# Patient Record
Sex: Male | Born: 1960 | Race: White | Hispanic: No | Marital: Married | Smoking: Never smoker
Health system: Southern US, Community
[De-identification: ages and names within clinical notes are randomized; demographics above are authoritative.]

## PROBLEM LIST (undated history)

## (undated) DIAGNOSIS — J329 Chronic sinusitis, unspecified: Secondary | ICD-10-CM

## (undated) DIAGNOSIS — K5909 Other constipation: Secondary | ICD-10-CM

## (undated) DIAGNOSIS — Z85828 Personal history of other malignant neoplasm of skin: Secondary | ICD-10-CM

## (undated) DIAGNOSIS — L501 Idiopathic urticaria: Secondary | ICD-10-CM

## (undated) DIAGNOSIS — E291 Testicular hypofunction: Secondary | ICD-10-CM

## (undated) DIAGNOSIS — E041 Nontoxic single thyroid nodule: Secondary | ICD-10-CM

## (undated) DIAGNOSIS — T7840XA Allergy, unspecified, initial encounter: Secondary | ICD-10-CM

## (undated) DIAGNOSIS — G4730 Sleep apnea, unspecified: Secondary | ICD-10-CM

## (undated) DIAGNOSIS — R7301 Impaired fasting glucose: Secondary | ICD-10-CM

## (undated) DIAGNOSIS — G709 Myoneural disorder, unspecified: Secondary | ICD-10-CM

## (undated) DIAGNOSIS — K279 Peptic ulcer, site unspecified, unspecified as acute or chronic, without hemorrhage or perforation: Secondary | ICD-10-CM

## (undated) DIAGNOSIS — Z5189 Encounter for other specified aftercare: Secondary | ICD-10-CM

## (undated) DIAGNOSIS — I878 Other specified disorders of veins: Secondary | ICD-10-CM

## (undated) DIAGNOSIS — N400 Enlarged prostate without lower urinary tract symptoms: Secondary | ICD-10-CM

## (undated) DIAGNOSIS — R9389 Abnormal findings on diagnostic imaging of other specified body structures: Secondary | ICD-10-CM

## (undated) DIAGNOSIS — Z9989 Dependence on other enabling machines and devices: Secondary | ICD-10-CM

## (undated) DIAGNOSIS — Z86718 Personal history of other venous thrombosis and embolism: Secondary | ICD-10-CM

## (undated) DIAGNOSIS — Z86010 Personal history of colonic polyps: Secondary | ICD-10-CM

## (undated) DIAGNOSIS — C801 Malignant (primary) neoplasm, unspecified: Secondary | ICD-10-CM

## (undated) DIAGNOSIS — J069 Acute upper respiratory infection, unspecified: Secondary | ICD-10-CM

## (undated) DIAGNOSIS — G4733 Obstructive sleep apnea (adult) (pediatric): Secondary | ICD-10-CM

## (undated) DIAGNOSIS — Z860101 Personal history of adenomatous and serrated colon polyps: Secondary | ICD-10-CM

## (undated) DIAGNOSIS — G35 Multiple sclerosis: Secondary | ICD-10-CM

## (undated) HISTORY — DX: Encounter for other specified aftercare: Z51.89

## (undated) HISTORY — DX: Obstructive sleep apnea (adult) (pediatric): G47.33

## (undated) HISTORY — DX: Abnormal findings on diagnostic imaging of other specified body structures: R93.89

## (undated) HISTORY — DX: Testicular hypofunction: E29.1

## (undated) HISTORY — DX: Myoneural disorder, unspecified: G70.9

## (undated) HISTORY — DX: Sleep apnea, unspecified: G47.30

## (undated) HISTORY — DX: Nontoxic single thyroid nodule: E04.1

## (undated) HISTORY — PX: COLONOSCOPY: SHX174

## (undated) HISTORY — DX: Chronic sinusitis, unspecified: J32.9

## (undated) HISTORY — DX: Idiopathic urticaria: L50.1

## (undated) HISTORY — DX: Acute upper respiratory infection, unspecified: J06.9

## (undated) HISTORY — DX: Other specified disorders of veins: I87.8

## (undated) HISTORY — DX: Personal history of colonic polyps: Z86.010

## (undated) HISTORY — DX: Allergy, unspecified, initial encounter: T78.40XA

## (undated) HISTORY — DX: Personal history of other venous thrombosis and embolism: Z86.718

## (undated) HISTORY — DX: Dependence on other enabling machines and devices: Z99.89

## (undated) HISTORY — DX: Other constipation: K59.09

## (undated) HISTORY — DX: Benign prostatic hyperplasia without lower urinary tract symptoms: N40.0

## (undated) HISTORY — DX: Peptic ulcer, site unspecified, unspecified as acute or chronic, without hemorrhage or perforation: K27.9

## (undated) HISTORY — DX: Impaired fasting glucose: R73.01

## (undated) HISTORY — DX: Personal history of adenomatous and serrated colon polyps: Z86.0101

## (undated) HISTORY — DX: Multiple sclerosis: G35

## (undated) HISTORY — DX: Personal history of other malignant neoplasm of skin: Z85.828

## (undated) HISTORY — PX: TONSILLECTOMY: SUR1361

---
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
## 1984-07-14 HISTORY — PX: APPENDECTOMY: SHX54

## 1990-07-14 DIAGNOSIS — K279 Peptic ulcer, site unspecified, unspecified as acute or chronic, without hemorrhage or perforation: Secondary | ICD-10-CM | POA: Insufficient documentation

## 1990-07-14 DIAGNOSIS — G35 Multiple sclerosis: Secondary | ICD-10-CM

## 1990-07-14 HISTORY — DX: Multiple sclerosis: G35

## 2003-07-15 DIAGNOSIS — G4733 Obstructive sleep apnea (adult) (pediatric): Secondary | ICD-10-CM

## 2003-07-15 HISTORY — DX: Obstructive sleep apnea (adult) (pediatric): G47.33

## 2011-06-12 ENCOUNTER — Ambulatory Visit (INDEPENDENT_AMBULATORY_CARE_PROVIDER_SITE_OTHER): Payer: Managed Care, Other (non HMO) | Admitting: Family Medicine

## 2011-06-12 ENCOUNTER — Encounter: Payer: Self-pay | Admitting: Family Medicine

## 2011-06-12 DIAGNOSIS — N400 Enlarged prostate without lower urinary tract symptoms: Secondary | ICD-10-CM

## 2011-06-12 DIAGNOSIS — K5900 Constipation, unspecified: Secondary | ICD-10-CM

## 2011-06-12 DIAGNOSIS — J309 Allergic rhinitis, unspecified: Secondary | ICD-10-CM

## 2011-06-12 DIAGNOSIS — Z1211 Encounter for screening for malignant neoplasm of colon: Secondary | ICD-10-CM

## 2011-06-12 DIAGNOSIS — G35 Multiple sclerosis: Secondary | ICD-10-CM

## 2011-06-12 DIAGNOSIS — K6289 Other specified diseases of anus and rectum: Secondary | ICD-10-CM

## 2011-06-12 DIAGNOSIS — G35D Multiple sclerosis, unspecified: Secondary | ICD-10-CM

## 2011-06-12 LAB — POCT URINALYSIS DIPSTICK
Blood, UA: NEGATIVE
Protein, UA: NEGATIVE
Spec Grav, UA: 1.025
Urobilinogen, UA: 0.2

## 2011-06-12 LAB — CBC WITH DIFFERENTIAL/PLATELET
Basophils Relative: 0.4 % (ref 0.0–3.0)
Eosinophils Relative: 1.9 % (ref 0.0–5.0)
Hemoglobin: 14.6 g/dL (ref 13.0–17.0)
Lymphocytes Relative: 31.6 % (ref 12.0–46.0)
MCV: 93 fl (ref 78.0–100.0)
Neutrophils Relative %: 55.7 % (ref 43.0–77.0)
RBC: 4.63 Mil/uL (ref 4.22–5.81)
WBC: 6.1 10*3/uL (ref 4.5–10.5)

## 2011-06-12 LAB — COMPREHENSIVE METABOLIC PANEL
Albumin: 4.1 g/dL (ref 3.5–5.2)
CO2: 29 mEq/L (ref 19–32)
Calcium: 9.4 mg/dL (ref 8.4–10.5)
GFR: 80.15 mL/min (ref >60.00)
Glucose, Bld: 85 mg/dL (ref 70–99)
Potassium: 4.8 mEq/L (ref 3.5–5.1)
Sodium: 140 mEq/L (ref 135–145)
Total Protein: 6.9 g/dL (ref 6.0–8.3)

## 2011-06-12 LAB — SEDIMENTATION RATE: Sed Rate: 10 mm/hr (ref 0–22)

## 2011-06-12 NOTE — Progress Notes (Signed)
Office Note 06/13/2011  CC:  Chief Complaint  Patient presents with  . Establish Care    urinary issues, referral to neuro for MS management    HPI:  Brian Walsh is a 50 y.o. White male who is here to establish care and discuss bowel issue lately. Patient's most recent primary MD: Dr. Warnell Forester in [place], [place]. Old records were not reviewed prior to or during today's visit.  Describes about 6 wk history of problems with feeling vague low back discomfort and fullness in the middle of the night/early morning hours, and this feeling is consistently relieved by having a BM.  No abd pain, no nausea, no trouble with obstructive or other urinary symptoms. Says he also notes a tendency lately to have difficulty getting his BMs to come out, and only occasionally are they hard.  Evacuation often seems incomplete.  Often feels the sensation that he has to defacate but nothing ends up coming out.  No rectal pain or bleeding, but does occasionally have feeling of rectal fullness/discomfort that tries to get to go away by shifting around on his bottom while sitting.   No significant change in diet or activity preceding onset of this problem.   Has 2 cups of coffee daily.  He does not take any stool laxatives, fiber supplements, or stool softeners.  Past Medical History  Diagnosis Date  . Multiple sclerosis 1992    Remission since 1996 on no meds.  Initial presentation was gait/balance and leg weakness sx's.  . Allergic rhinitis     Much improved with allergy immunotherapy  . Asthma     no inhaler requirement since allergies under control with immunotherapy  . PUD (peptic ulcer disease)     Bleeding ulcer 1978  . BPH (benign prostatic hypertrophy)     Rapaflo helps.  PSA's have been normal per pt report.  . OSA on CPAP 2005    Past Surgical History  Procedure Date  . Femur fracture surgery 1978    with rod insertion.  Rod removal 1979  . Appendectomy 1986  . Colonoscopy 2007      normal    Family History  Problem Relation Age of Onset  . Cancer Mother     breast  . Parkinsonism Father   . Stroke Father   . Colon polyps Father     History   Social History  . Marital Status: Married    Spouse Name: N/A    Number of Children: N/A  . Years of Education: N/A   Occupational History  . Not on file.   Social History Main Topics  . Smoking status: Never Smoker   . Smokeless tobacco: Never Used  . Alcohol Use: 2.4 oz/week    4 Glasses of wine per week  . Drug Use: No  . Sexually Active: Not on file   Other Topics Concern  . 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.No tobacco, 3-4 glasses of red wine per week, no drug use.No exercise.    Outpatient Encounter Prescriptions as of 06/12/2011  Medication Sig Dispense Refill  . Arginine 1000 MG TABS Take 1-2 tablets by mouth daily.        . Multiple Vitamin (MULTIVITAMIN) tablet Take 1 tablet by mouth daily.        . NON FORMULARY ALLERGY INJECTIONS.  Twice weekly       .  silodosin (RAPAFLO) 8 MG CAPS capsule Take 8 mg by mouth daily.        . vitamin C (ASCORBIC ACID) 500 MG tablet Take 500 mg by mouth daily.          No Known Allergies  ROS Review of Systems  Constitutional: Negative for fever, chills, appetite change and fatigue.  HENT: Negative for hearing loss, ear pain, congestion, sore throat, neck stiffness and dental problem.   Eyes: Negative for discharge, redness and visual disturbance.  Respiratory: Negative for cough, chest tightness, shortness of breath and wheezing.   Cardiovascular: Negative for chest pain, palpitations and leg swelling.  Gastrointestinal: Positive for constipation (as per HPI). Negative for nausea, vomiting, abdominal pain, diarrhea, blood in stool, abdominal distention and anal bleeding.  Genitourinary: Negative for dysuria,  urgency, frequency, hematuria, flank pain, decreased urine volume and difficulty urinating.  Musculoskeletal: Negative for myalgias, back pain, joint swelling and arthralgias.  Skin: Negative for pallor and rash.  Neurological: Negative for dizziness, tremors, seizures, syncope, facial asymmetry, speech difficulty, weakness, light-headedness, numbness and headaches.  Hematological: Negative for adenopathy. Does not bruise/bleed easily.  Psychiatric/Behavioral: Negative for confusion, sleep disturbance and dysphoric mood. The patient is not nervous/anxious.      PE; Blood pressure 136/82, pulse 71, temperature 97.4 F (36.3 C), temperature source Oral, height 5' 9.5" (1.765 m), weight 216 lb (97.977 kg), SpO2 97.00%. Gen: Alert, well appearing.  Patient is oriented to person, place, time, and situation. ENT: Ears: EACs clear, normal epithelium.  TMs with good light reflex and landmarks bilaterally.  Eyes: no injection, icteris, swelling, or exudate.  EOMI, PERRLA. Nose: no drainage or turbinate edema/swelling.  No injection or focal lesion.  Mouth: lips without lesion/swelling.  Oral mucosa pink and moist.  Dentition intact and without obvious caries or gingival swelling.  Oropharynx without erythema, exudate, or swelling.  Neck - No masses or thyromegaly or limitation in range of motion RRR, without murmur, rub, or gallop. Carotid pulses easily palpable, symmetric pulsations, no bruit or palpable dilatation. Radial, femoral, and ankle pulses easily palpable and symmetric. No abdominal bruit. No varicosities.  Cap refill in extremities is brisk. Chest with symmetric expansion, good aeration, nonlabored respirations.  Clear and equal breath sounds in all lung fields.  No clubbing or cyanosis. ABD: soft, NT, ND, BS normal.  No hepatospenomegaly or mass.  No bruits. EXT: no clubbing, cyanosis, or edema.  Neuro: CN 2-12 intact bilaterally, strength 5/5 in proximal and distal upper extremities and  lower extremities bilaterally.  No sensory deficits.  No tremor.  No disdiadochokinesis.  No ataxia.  Upper extremity and lower extremity DTRs symmetric.  No pronator drift. Rectal exam: no hemorrhoids.  No mass, lesions, or tenderness.  His rectal tone is mildly diminished.  Brown colored stool wipings were hemoccult negative today.   PROSTATE EXAM: smooth and symmetric without nodules or tenderness.  Pertinent labs:  CC UA today was normal  ASSESSMENT AND PLAN:   Constipation, acute Change in bowel habits may be a reflection of diminished sphincter tone.  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 I'm referring him to Dr. Modesto Charon, [facility] Neurologist, for further evaluation of this plus for ongoing expertise regarding his multiple sclerosis.  However, will also start trial of senakot otc tabs, 2 tabs qhs to see if this helps him feel better evacuative function. Recheck 2 wks.  BPH (benign prostatic hyperplasia) Problem stable.  Continue current medications and diet appropriate for this condition.  We have reviewed our  general long term plan for this problem and also reviewed symptoms and signs that should prompt the patient to call or return to the office. Check PSA today.  Allergic rhinitis Problem stable.  Continue current medications and diet appropriate for this condition.  We have reviewed our general long term plan for this problem and also reviewed symptoms and signs that should prompt the patient to call or return to the office. Refer to [facility] All[facility] clinic for ongoing mgmt of allergy immunotherapy.  Multiple sclerosis Remission. Refer to Dr. Modesto Charon, neurologist, for ongoing management.  Colon cancer screening Refer to [facility] for further recommendations regarding when to get his next screening colonoscopy since his first one 5 yrs ago was normal (no FH of colon cancer but his father had colon polyps).   Pt already got seasonal flu vaccine (> 2 wks  ago).  Return in about 2 weeks (around 06/26/2011) for f/u constipation.

## 2011-06-12 NOTE — Patient Instructions (Signed)
Take OTC med called senakot (store brand/generic is fine), 2 tabs every night.

## 2011-06-13 ENCOUNTER — Telehealth: Payer: Self-pay | Admitting: Family Medicine

## 2011-06-13 ENCOUNTER — Encounter: Payer: Self-pay | Admitting: Family Medicine

## 2011-06-13 DIAGNOSIS — K5900 Constipation, unspecified: Secondary | ICD-10-CM | POA: Insufficient documentation

## 2011-06-13 DIAGNOSIS — J3089 Other allergic rhinitis: Secondary | ICD-10-CM | POA: Insufficient documentation

## 2011-06-13 DIAGNOSIS — N400 Enlarged prostate without lower urinary tract symptoms: Secondary | ICD-10-CM | POA: Insufficient documentation

## 2011-06-13 DIAGNOSIS — G35 Multiple sclerosis: Secondary | ICD-10-CM | POA: Insufficient documentation

## 2011-06-13 DIAGNOSIS — Z1211 Encounter for screening for malignant neoplasm of colon: Secondary | ICD-10-CM | POA: Insufficient documentation

## 2011-06-13 DIAGNOSIS — K6289 Other specified diseases of anus and rectum: Secondary | ICD-10-CM | POA: Insufficient documentation

## 2011-06-13 NOTE — Assessment & Plan Note (Signed)
Problem stable.  Continue current medications and diet appropriate for this condition.  We have reviewed our general long term plan for this problem and also reviewed symptoms and signs that should prompt the patient to call or return to the office. Check PSA today.

## 2011-06-13 NOTE — Assessment & Plan Note (Signed)
Refer to [facility] for further recommendations regarding when to get his next screening colonoscopy since his first one 5 yrs ago was normal (no FH of colon cancer but his father had colon polyps).

## 2011-06-13 NOTE — Progress Notes (Signed)
Quick Note:  Pls notify pt that all labs were normal. Thx ______

## 2011-06-13 NOTE — Assessment & Plan Note (Signed)
Remission. Refer to Dr. Modesto Charon, neurologist, for ongoing management.

## 2011-06-13 NOTE — Assessment & Plan Note (Signed)
Change in bowel habits may be a reflection of diminished sphincter tone.  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 I'm referring him to Dr. Modesto Charon, [facility] Neurologist, for further evaluation of this plus for ongoing expertise regarding his multiple sclerosis.  However, will also start trial of senakot otc tabs, 2 tabs qhs to see if this helps him feel better evacuative function. Recheck 2 wks.

## 2011-06-13 NOTE — Telephone Encounter (Signed)
Pls request records from Dr. Warnell Forester in [place], [place] (Internist).  thx.

## 2011-06-13 NOTE — Assessment & Plan Note (Signed)
Problem stable.  Continue current medications and diet appropriate for this condition.  We have reviewed our general long term plan for this problem and also reviewed symptoms and signs that should prompt the patient to call or return to the office. Refer to [facility] Allergy clinic for ongoing mgmt of allergy immunotherapy.

## 2011-06-18 ENCOUNTER — Encounter: Payer: Self-pay | Admitting: Neurology

## 2011-06-26 ENCOUNTER — Ambulatory Visit (INDEPENDENT_AMBULATORY_CARE_PROVIDER_SITE_OTHER): Payer: Managed Care, Other (non HMO) | Admitting: Family Medicine

## 2011-06-26 ENCOUNTER — Encounter: Payer: Self-pay | Admitting: Family Medicine

## 2011-06-26 VITALS — BP 131/81 | HR 71 | Ht 69.5 in | Wt 219.0 lb

## 2011-06-26 DIAGNOSIS — K5900 Constipation, unspecified: Secondary | ICD-10-CM

## 2011-06-26 MED ORDER — LUBIPROSTONE 24 MCG PO CAPS: 24.0000 ug | ORAL_CAPSULE | Freq: Two times a day (BID) | ORAL | Status: DC

## 2011-06-26 NOTE — Progress Notes (Signed)
OFFICE NOTE  06/26/2011  CC:  Chief Complaint  Patient presents with  . Follow-up    constipation     HPI:   Patient is a 50 y.o. Caucasian male who is here for f/u constipation. Senakot didn't help except for the first day he took it.  Still feeling like he struggles to evacuate bowels and doesn't feel like he empties them completely. Reviewed labs done a couple of weeks ago: all normal.  Has appt with Dr. Modesto Charon, neurologist, in less than a week. Only new sx he mentions is heightened sensation of ejaculation/orgasm.    Pertinent PMH:  Multiple sclerosis BPH  Past surgical, family, and social history reviewed and there are no changes since the patient's last office visit with me.  MEDS;   Outpatient Prescriptions Prior to Visit  Medication Sig Dispense Refill  . Arginine 1000 MG TABS Take 1-2 tablets by mouth daily.        . Multiple Vitamin (MULTIVITAMIN) tablet Take 1 tablet by mouth daily.        . NON FORMULARY ALLERGY INJECTIONS.  Twice weekly       . silodosin (RAPAFLO) 8 MG CAPS capsule Take 8 mg by mouth daily.        . vitamin C (ASCORBIC ACID) 500 MG tablet Take 500 mg by mouth daily.          PE: Blood pressure 131/81, pulse 71, height 5' 9.5" (1.765 m), weight 219 lb (99.338 kg). Gen: Alert, well appearing.  Patient is oriented to person, place, time, and situation. ENT:  Eyes: no injection, icteris, swelling, or exudate.  EOMI, PERRLA. Nose: no drainage or turbinate edema/swelling.  No injection or focal lesion.  Mouth: lips without lesion/swelling.  Oral mucosa pink and moist.  Dentition intact and without obvious caries or gingival swelling.  Oropharynx without erythema, exudate, or swelling.  CV: RRR, no m/r/g.   LUNGS: CTA bilat, nonlabored resps, good aeration in all lung fields. ABD: soft, nondistended.  BS normal.  NO mass or HSM.  Mild diffuse TTP around central abdomen in lower portion--"discomfort".  No guarding or rebound.  LABS:   Lab Results    Component Value Date   WBC 6.1 06/12/2011   HGB 14.6 06/12/2011   HCT 43.1 06/12/2011   MCV 93.0 06/12/2011   PLT 219.0 06/12/2011     Chemistry      Component Value Date/Time   NA 140 06/12/2011 1028   K 4.8 06/12/2011 1028   CL 105 06/12/2011 1028   CO2 29 06/12/2011 1028   BUN 14 06/12/2011 1028   CREATININE 1.0 06/12/2011 1028      Component Value Date/Time   CALCIUM 9.4 06/12/2011 1028   ALKPHOS 55 06/12/2011 1028   AST 24 06/12/2011 1028   ALT 39 06/12/2011 1028   BILITOT 0.4 06/12/2011 1028     Lab Results  Component Value Date   PSA 1.73 06/12/2011   Lab Results  Component Value Date   ESRSEDRATE 10 06/12/2011    IMPRESSION AND PLAN:  Constipation, acute With decreased sphincter tone noted last exam a couple of weeks ago. No significant change with senakot. Continue senakot, add amitiza (#16 caps samples of this given today) 1 qd or bid. He'll call to report response to this med. Reassured pt the best I could.     FOLLOW UP:  Return for he'll get back with me in Jan or Feb 2013 after he gets his colonoscopy.Marland Kitchen

## 2011-06-26 NOTE — Assessment & Plan Note (Signed)
With decreased sphincter tone noted last exam a couple of weeks ago. No significant change with senakot. Continue senakot, add amitiza (#16 caps samples of this given today) 1 qd or bid. He'll call to report response to this med. Reassured pt the best I could.

## 2011-06-30 ENCOUNTER — Ambulatory Visit (INDEPENDENT_AMBULATORY_CARE_PROVIDER_SITE_OTHER): Payer: Managed Care, Other (non HMO) | Admitting: Neurology

## 2011-06-30 ENCOUNTER — Encounter: Payer: Self-pay | Admitting: Neurology

## 2011-06-30 VITALS — BP 142/82 | HR 76 | Wt 216.0 lb

## 2011-06-30 DIAGNOSIS — G35 Multiple sclerosis: Secondary | ICD-10-CM

## 2011-06-30 NOTE — Progress Notes (Signed)
Dear Dr. Milinda Cave,  Thank you for having me see Brian Walsh in consultation today at [facility] for his problem with multiple sclerosis and his associated symptoms.  As you may recall, he is a 50 y.o. year old male with a history of multiple sclerosis diagnosed in 1992 based on brain MRI(multiple pericollosal lesions), +IgG index, - OCBs who initially presented with left sided weakness.  He was treated at that time with high dose IV steroids.  He had two other relapses, in 1995 and 1996 composed of "electrical fire in the head" and ringing in the ears, both treated with oral steroids.  There was no other definitive complaints at that time.  Since that time he has had no other "relapses".  He did say that he had "iritis" at his first presentation.  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  He has never been on an immune modulator.  His only other symptom occurred about 3 years ago when he began to develop urinary urgency.  He had a urologic workup and they said it may be related to his MS, and was put on silodosin, which helped.  He did not have any neurologic workup at that time.  Over the last 5-6 weeks he has had progressive constipation, which he feels is making his urinary urgency worse.  He denies numbness in his perineal area.  He has not had any change in medications other than the use of the Senna and a new medication lubiprostone.  He feels like he feels pressure on his bladder from his bowels.  Notably he has never had an MRI of his spine to look for demyelination.  Past Medical History  Diagnosis Date  . Multiple sclerosis 1992    Remission since 1996 on no meds.  Initial presentation was gait/balance and leg weakness sx's.  . Allergic rhinitis     Much improved with allergy immunotherapy  . Asthma     no inhaler requirement since allergies under control with immunotherapy  . PUD (peptic ulcer disease)     Bleeding ulcer 1978  . BPH (benign  prostatic hypertrophy)     Rapaflo helps.  PSA's have been normal per pt report.  . OSA on CPAP 2005    Past Surgical History  Procedure Date  . Femur fracture surgery 1978    with rod insertion.  Rod removal 1979  . Appendectomy 1986  . Colonoscopy 2007    normal    History   Social History  . Marital Status: Married    Spouse Name: N/A    Number of Children: N/A  . Years of Education: N/A   Social History Main Topics  . Smoking status: Never Smoker   . Smokeless tobacco: Never Used  . Alcohol Use: 2.4 oz/week    4 Glasses of wine per week  . Drug Use: No  . Sexually Active: None   Other Topics Concern  . 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    Family History  Problem Relation Age of Onset  . Cancer Mother     breast  . Parkinsonism Father   . Stroke Father   . Colon polyps Father     Current Outpatient Prescriptions on File Prior to Visit  Medication Sig  Dispense Refill  . Arginine 1000 MG TABS Take 1-2 tablets by mouth daily.        Marland Kitchen aspirin EC 81 MG tablet Take 81 mg by mouth.        . lubiprostone (AMITIZA) 24 MCG capsule Take 1 capsule (24 mcg total) by mouth 2 (two) times daily with a meal.  16 capsule  0  . Multiple Vitamin (MULTIVITAMIN) tablet Take 1 tablet by mouth daily.        . NON FORMULARY ALLERGY INJECTIONS.  Twice weekly       . Omega-3 Fatty Acids (FISH OIL) 1000 MG CAPS Take 1 capsule by mouth 2 (two) times daily.        . silodosin (RAPAFLO) 8 MG CAPS capsule Take 8 mg by mouth daily.        . vitamin C (ASCORBIC ACID) 500 MG tablet Take 500 mg by mouth daily.          No Known Allergies    ROS:  13 systems were reviewed and are notable for abdominal pain, difficulty starting and stopping stream, headaches, anxiety.  Heightened  sense of stimulation with ejaculation.  All other review of systems are unremarkable.   Examination:  Filed Vitals:   06/30/11 1318  BP: 142/82  Pulse: 76  Weight: 216 lb (97.977 kg)     In general, very well appearing man.  Cardiovascular: The patient has a regular rate and rhythm and no carotid bruits.  Fundoscopy:  Disks are flat. Vessel caliber within normal limits.  No pallor.  Mental status:   The patient is oriented to person, place and time. Recent and remote memory are intact. Attention span and concentration are normal. Language including repetition, naming, following commands are intact. Fund of knowledge of current and historical events, as well as vocabulary are normal.  Cranial Nerves: Pupils are equally round and reactive to light. No APD. Visual fields full to confrontation. Extraocular movements are intact without nystagmus. No INO. Facial sensation and muscles of mastication are intact. Muscles of facial expression are symmetric. Hearing intact to bilateral finger rub. Tongue protrusion, uvula, palate midline.  Shoulder shrug intact  Motor:  The patient has normal bulk and tone, no pronator drift.  There are no adventitious movements.  5/5 bilaterally.  Reflexes:   Biceps  Triceps Brachioradialis Knee Ankle  Right 3+  3+  3+   3+ 3+  Left  3+  3+  3+   3+ 3+  Toes down  Coordination:  Normal finger to nose.  No dysdiadokinesia.  Sensation is intact to temperature and vibration except decreased temperature in right arm.  Perineal sensation is normal.  Gait and Station are normal.  Tandem gait is intact.  Romberg is negative  MRI brain was reviewed from 2005 and it revealed multiple non-enhancing pericollosal lesions with out infratentorial involvement.  Impression/Recs: Multiple sclerosis, with symptoms of urinary urgency and newer symptoms of constipation.  Certainly from his examination his MS seems well controlled in that he doesn't seem to have any  significant focal motor abnormalities or abnormalities of his cranial nerves.  He may have some soft signs of sensory involvement in the spinothalamic tracts subserving the right side.  However, I am concerned about his history of urinary urgency.  This is typically seen in patient's with MS who have spinal cord involvement.  It is a little atypical that he doesn't have erectile dysfunction though.  His bowel problems are also of concern, in that bowel  dysfunction, and constipation in particular are commonly seen in MS usually due to parodoxical puborectalis contraction and are associated with spinal cord lesions as well.  However, he doesn't have any other signs of spinal cord involvement on exam.  While I would recommend an MRI brain, C and T spine with and without contrast, he would like to proceed to colonoscopy first.  I think this is reasonable so as to rule out an obstructive lesion.  If this is unrevealing, then he is going to call us and I will order the MRI brain, C and T-spine to see if he has a spinal cord lesion.  This would also allow [place] to compare his 2005 brain MRI to a current MRI to look for disease progression.  The patient will call us to follow up.  Thank you for having [place] see Brian Walsh in consultation.  Feel free to contact me with any questions.  Lupita Raider Modesto Charon, MD [facility], [place] [address] [place], [place] [address] Phone: [phone] Fax: [phone].

## 2011-07-01 ENCOUNTER — Encounter: Payer: Self-pay | Admitting: Family Medicine

## 2011-07-04 ENCOUNTER — Other Ambulatory Visit: Payer: Managed Care, Other (non HMO) | Admitting: Internal Medicine

## 2011-07-24 ENCOUNTER — Telehealth: Payer: Self-pay | Admitting: Family Medicine

## 2011-07-24 NOTE — Telephone Encounter (Signed)
Patient would like Rx for Amitiza sent to [facility] in [place] (he is working there this week) [phone]

## 2011-07-25 NOTE — Telephone Encounter (Signed)
Samples given on 12/13.  Message left for pt to return call on home/mobile to discuss response.

## 2011-07-30 MED ORDER — LUBIPROSTONE 24 MCG PO CAPS: 24.0000 ug | ORAL_CAPSULE | Freq: Two times a day (BID) | ORAL | Status: DC

## 2011-07-30 NOTE — Telephone Encounter (Signed)
No return call from patient.

## 2011-10-02 ENCOUNTER — Telehealth: Payer: Self-pay | Admitting: Emergency Medicine

## 2011-10-03 NOTE — Telephone Encounter (Signed)
Advised pt if he has name of GI doc we can try to make appt for him.  Pt is in transition in moving to [place] and has no driver here.  His wife is still in [place].  Pt will get name of GI and call us on Monday.

## 2011-10-20 ENCOUNTER — Other Ambulatory Visit: Payer: Self-pay | Admitting: *Deleted

## 2011-10-20 MED ORDER — SILODOSIN 8 MG PO CAPS: 8.0000 mg | ORAL_CAPSULE | Freq: Every day | ORAL | Status: DC

## 2011-10-20 NOTE — Telephone Encounter (Signed)
Pt called requesting refill on rapaflo.  Pt is due for follow up.  30 day sent.  Must have OV for more refills.

## 2011-11-05 ENCOUNTER — Ambulatory Visit: Payer: Managed Care, Other (non HMO) | Admitting: Family Medicine

## 2011-11-12 ENCOUNTER — Ambulatory Visit: Payer: Managed Care, Other (non HMO) | Admitting: Family Medicine

## 2011-11-19 ENCOUNTER — Other Ambulatory Visit: Payer: Self-pay | Admitting: Family Medicine

## 2011-11-19 MED ORDER — SILODOSIN 8 MG PO CAPS: 8.0000 mg | ORAL_CAPSULE | Freq: Every day | ORAL | Status: DC

## 2011-11-19 NOTE — Telephone Encounter (Signed)
30 day supply sent to last until patient's appt on 11/24/11.

## 2011-11-24 ENCOUNTER — Ambulatory Visit (INDEPENDENT_AMBULATORY_CARE_PROVIDER_SITE_OTHER): Payer: Managed Care, Other (non HMO) | Admitting: Family Medicine

## 2011-11-24 ENCOUNTER — Encounter: Payer: Self-pay | Admitting: Family Medicine

## 2011-11-24 VITALS — BP 131/84 | HR 54 | Ht 69.5 in | Wt 211.0 lb

## 2011-11-24 DIAGNOSIS — K5909 Other constipation: Secondary | ICD-10-CM | POA: Insufficient documentation

## 2011-11-24 DIAGNOSIS — Z23 Encounter for immunization: Secondary | ICD-10-CM

## 2011-11-24 DIAGNOSIS — K5900 Constipation, unspecified: Secondary | ICD-10-CM

## 2011-11-24 HISTORY — DX: Other constipation: K59.09

## 2011-11-24 MED ORDER — SILODOSIN 8 MG PO CAPS: 8.0000 mg | ORAL_CAPSULE | Freq: Every day | ORAL | Status: DC

## 2011-11-24 NOTE — Progress Notes (Signed)
OFFICE VISIT  11/27/2011   CC:  Chief Complaint  Patient presents with  . Follow-up    constipation     HPI:    Patient is a 51 y.o. Caucasian male who presents for 65mo f/u constipation/change in bowel habits. Constipation has "stabilized" taking 2 senakot per night; apparently not using amitiza.  Is doing consistent exercise on elliptical machine. Plans on trying to get colonoscopy but is trying to coordinate this with the MRI Dr. Modesto Charon has ordered since he has no one around to help transport him.   Has recurrent nasal obstruction/septal deviation, asks for name of ENT for further eval since everything is being done from an allergy standpoint. Also asks for name of local urologist to see get further expert opinion on his BPH (besides rapaflo)--he had a urologist but this was in [place] where he used to live.   Past Medical History  Diagnosis Date  . Multiple sclerosis 1992    Remission since 1996 on no meds.  Initial presentation was gait/balance and leg weakness sx's.  . Allergic rhinitis     Much improved with allergy immunotherapy (molds primarily)  . Asthma     no inhaler requirement since allergies under control with immunotherapy  . PUD (peptic ulcer disease)     Bleeding ulcer 1978  . BPH (benign prostatic hypertrophy)     Rapaflo helps.  PSA's have been normal per pt report.  . OSA on CPAP 2005  . Chronic constipation     Past Surgical History  Procedure Date  . Femur fracture surgery 1978    with rod insertion.  Rod removal 1979  . Appendectomy 1986  . Colonoscopy 2007    normal    Outpatient Prescriptions Prior to Visit  Medication Sig Dispense Refill  . Arginine 1000 MG TABS Take 1-2 tablets by mouth daily.        Marland Kitchen aspirin EC 81 MG tablet Take 81 mg by mouth.        . Multiple Vitamin (MULTIVITAMIN) tablet Take 1 tablet by mouth daily.        . NON FORMULARY ALLERGY INJECTIONS.  Twice weekly       . Omega-3 Fatty Acids (FISH OIL) 1000 MG CAPS Take 1  capsule by mouth 2 (two) times daily.        Marland Kitchen senna (SENOKOT) 8.6 MG tablet Take 1 tablet by mouth daily.        . vitamin C (ASCORBIC ACID) 500 MG tablet Take 500 mg by mouth daily.        . silodosin (RAPAFLO) 8 MG CAPS capsule Take 1 capsule (8 mg total) by mouth daily.  30 capsule  0  . lubiprostone (AMITIZA) 24 MCG capsule Take 1 capsule (24 mcg total) by mouth 2 (two) times daily with a meal.  30 capsule  1    No Known Allergies  ROS As per HPI  PE: Blood pressure 131/84, pulse 54, height 5' 9.5" (1.765 m), weight 211 lb (95.709 kg). Gen: Alert, well appearing.  Patient is oriented to person, place, time, and situation. No further exam today.  LABS:  None today  IMPRESSION AND PLAN:  Constipation, chronic Seems to have stabilized with 2 senakot every night and an increase in regular physical exercise. Still needs to pursue colonocospy with GI, so I gave him their phone number so he can get back in contact with them.   I did give him the name of an ENT (Dr. Lazarus Salines) and a  urologist (Dr. Isabel Caprice) as per his request if/when he wants to pursure further specialist eval for his nasal/sinus issues and BPH, respectively.  Tdap given today.  FOLLOW UP: Return if symptoms worsen or fail to improve.

## 2011-11-24 NOTE — Assessment & Plan Note (Signed)
Seems to have stabilized with 2 senakot every night and an increase in regular physical exercise. Still needs to pursue colonocospy with GI, so I gave him their phone number so he can get back in contact with them.

## 2011-11-24 NOTE — Patient Instructions (Signed)
ENT MD I recommend: Dr. Lazarus Salines with [facility] in [place].  Urologist I recommend: Dr. Isabel Caprice with [facility] in [place].  [facility]: [id number]

## 2011-11-27 ENCOUNTER — Encounter: Payer: Self-pay | Admitting: Family Medicine

## 2012-01-16 ENCOUNTER — Telehealth: Payer: Self-pay | Admitting: *Deleted

## 2012-01-16 ENCOUNTER — Ambulatory Visit (AMBULATORY_SURGERY_CENTER): Payer: Managed Care, Other (non HMO) | Admitting: *Deleted

## 2012-01-16 VITALS — Ht 69.5 in | Wt 217.0 lb

## 2012-01-16 DIAGNOSIS — Z1211 Encounter for screening for malignant neoplasm of colon: Secondary | ICD-10-CM

## 2012-01-16 MED ORDER — MOVIPREP 100 G PO SOLR: ORAL | Status: DC

## 2012-01-16 NOTE — Progress Notes (Signed)
Patient called back with MD name. Last colonoscopy was done in [facility] by Dr.John Felicita Gage.  Release of information filled out and signed and given to P.J. CMA.

## 2012-01-16 NOTE — Telephone Encounter (Signed)
Patient for screening colonoscopy on 01-28-12. His last colonoscopy was 2007 in [place], your CMA P.J. Was given information today to try to get result. Patient believes it was normal exam. Patient's father has hx colon polyps, no family hx colon cancer. Patient was seen by Dr.McGowen on 11-24-11 for constipation, now patient taking senokot 2 tabs nightly. Do you want patient to have office visit prior to colonoscopy? Thanks

## 2012-01-19 ENCOUNTER — Encounter: Payer: Self-pay | Admitting: Internal Medicine

## 2012-01-20 NOTE — Telephone Encounter (Signed)
Patient scheduled for 02/17/12

## 2012-01-20 NOTE — Telephone Encounter (Signed)
I recommend an office visit given constipation issues, co-morbidities and lack of data at this point. i am willing to add him on so if there is no appointment available within a few weeks we can add him on for this month in office if he wishes.

## 2012-01-20 NOTE — Telephone Encounter (Signed)
Left message for patient to call back  

## 2012-01-28 ENCOUNTER — Encounter: Payer: Managed Care, Other (non HMO) | Admitting: Internal Medicine

## 2012-01-28 ENCOUNTER — Telehealth: Payer: Self-pay | Admitting: Internal Medicine

## 2012-01-28 NOTE — Telephone Encounter (Addendum)
Forward 6 pages to Dr. Stan Head for review on 01-28-12 ym

## 2012-02-05 ENCOUNTER — Encounter: Payer: Self-pay | Admitting: Internal Medicine

## 2012-02-17 ENCOUNTER — Ambulatory Visit (INDEPENDENT_AMBULATORY_CARE_PROVIDER_SITE_OTHER): Payer: Managed Care, Other (non HMO) | Admitting: Internal Medicine

## 2012-02-17 ENCOUNTER — Encounter: Payer: Self-pay | Admitting: Internal Medicine

## 2012-02-17 VITALS — BP 110/68 | HR 60 | Ht 70.0 in | Wt 217.2 lb

## 2012-02-17 DIAGNOSIS — R194 Change in bowel habit: Secondary | ICD-10-CM

## 2012-02-17 DIAGNOSIS — K5900 Constipation, unspecified: Secondary | ICD-10-CM

## 2012-02-17 DIAGNOSIS — R198 Other specified symptoms and signs involving the digestive system and abdomen: Secondary | ICD-10-CM

## 2012-02-17 NOTE — Patient Instructions (Addendum)
You have been scheduled for a colonoscopy with propofol. Please follow written instructions given to you at your visit today.  Please pick up your prep kit at the pharmacy within the next 1-3 days. If you use inhalers (even only as needed), please bring them with you on the day of your procedure.  Thank you for choosing me and [facility].  Carl E. Gessner, M.D., FACG  

## 2012-02-17 NOTE — Progress Notes (Addendum)
  Subjective:    Patient ID: Brian Walsh, male    DOB: 1960/11/14, 51 y.o.   MRN: [id number] Referred by: Jeoffrey Massed, MD HPI This is a very pleasant 51 year old white man with a history of multiple sclerosis. Over the last 6-12 months she's had increasing difficulty with defecation. He is having an increase number of smaller bowel movements associated with me or mucus. He has been straining and has been constipated at times. He tried Amitiza but at this point he is best served by taking 2 Senokot every night so that he has defecation which is complete or more so. Prior to using this and trying Amitiza he was having a lot of incomplete defecation. He complains of increased bloating and gas as well. He had a colonoscopy in [place], and 2006. He had internal hemorrhoids, a very small sigmoid nodule that was benign and not an adenoma.  GI review of systems is otherwise negative.  Medications, allergies, past medical history, past surgical history, family history and social history are reviewed and updated in the EMR.  Review of Systems This is positive for those things mentioned in the history of present illness, all other review of systems negative.    Objective:   Physical Exam General:  Well-developed, well-nourished and in no acute distress Eyes:  anicteric. Lungs: Clear to auscultation bilaterally. Heart:  S1S2, no rubs, murmurs, gallops. Abdomen:  soft, non-tender, no hepatosplenomegaly, hernia, or mass and BS+.  Rectal: deferred Neuro:  A&O x 3.  Psych:  appropriate mood and  Affect.   Data Reviewed: Previous colonoscopy and pathology report     Assessment & Plan:   1. Change in bowel habits   2. Constipation    Colonoscopy will be scheduled to investigate these problems. Most likely a functional disturbance and it has been 7 years since his last colonoscopy so it is prudent to exclude colorectal neoplasia the cause of problems.  The risks and benefits as well as  alternatives of endoscopic procedure(s) have been discussed and reviewed. All questions answered. The patient agrees to proceed.   I appreciate the opportunity to care for this patient.  CC: Jeoffrey Massed, MD

## 2012-03-18 ENCOUNTER — Ambulatory Visit (AMBULATORY_SURGERY_CENTER): Payer: Managed Care, Other (non HMO) | Admitting: Internal Medicine

## 2012-03-18 ENCOUNTER — Encounter: Payer: Self-pay | Admitting: Internal Medicine

## 2012-03-18 ENCOUNTER — Other Ambulatory Visit: Payer: Self-pay | Admitting: Internal Medicine

## 2012-03-18 VITALS — BP 135/75 | HR 61 | Temp 97.7°F | Resp 22 | Ht 70.0 in | Wt 217.0 lb

## 2012-03-18 DIAGNOSIS — R198 Other specified symptoms and signs involving the digestive system and abdomen: Secondary | ICD-10-CM

## 2012-03-18 DIAGNOSIS — D126 Benign neoplasm of colon, unspecified: Secondary | ICD-10-CM

## 2012-03-18 MED ORDER — LINACLOTIDE 145 MCG PO CAPS: 145.0000 ug | ORAL_CAPSULE | Freq: Every day | ORAL | Status: DC

## 2012-03-18 MED ORDER — SODIUM CHLORIDE 0.9 % IV SOLN: 500.0000 mL | INTRAVENOUS | Status: DC

## 2012-03-18 NOTE — Progress Notes (Signed)
Patient did not experience any of the following events: a burn prior to discharge; a fall within the facility; wrong site/side/patient/procedure/implant event; or a hospital transfer or hospital admission upon discharge from the facility. (G8907) Patient did not have preoperative order for IV antibiotic SSI prophylaxis. (G8918)  

## 2012-03-18 NOTE — Op Note (Signed)
[facility] [address] [place], [address]   COLONOSCOPY PROCEDURE REPORT  PATIENT: Brian, Walsh  MR#: [id number] BIRTHDATE: 20-May-1961 , 51  yrs. old GENDER: Male ENDOSCOPIST: Iva Boop, MD, [facility] REFERRED [id number], Aneta Mins PROCEDURE DATE:  03/18/2012 PROCEDURE:   Colonoscopy with snare polypectomy ASA CLASS:   Class II INDICATIONS:change in bowel habits. MEDICATIONS: propofol (Diprivan) 150mg  IV, MAC sedation, administered by CRNA, and These medications were titrated to patient response per physician's verbal order  DESCRIPTION OF PROCEDURE:   After the risks benefits and alternatives of the procedure were thoroughly explained, informed consent was obtained.  A digital rectal exam revealed no abnormalities of the rectum and A digital rectal exam revealed the prostate was not enlarged.   The LB [id number]  endoscope was introduced through the anus and advanced to the cecum, which was identified by both the appendix and ileocecal valve. No adverse events experienced.   The quality of the prep was excellent, using MoviPrep  The instrument was then slowly withdrawn as the colon was fully examined.      COLON FINDINGS: A smooth flat polyp measuring 5 mm in size was found at the cecum.  A polypectomy was performed with a cold snare.  The resection was complete and the polyp tissue was completely retrieved.   The colon mucosa was otherwise normal.  Retroflexed views revealed no abnormalities. The time to cecum=2 minutes 50 seconds.  Withdrawal time=9 minutes 10 seconds.  The scope was withdrawn and the procedure completed. COMPLICATIONS: There were no complications.  ENDOSCOPIC IMPRESSION: 1.   Flat polyp measuring 5 mm in size was found at the cecum; polypectomy was performed with a cold snare 2.   The colon mucosa was otherwise normal with excellent prep  RECOMMENDATIONS: 1.  Timing of repeat colonoscopy will be determined by  pathology findings. 2.  Trial of Linzess 145 mcg daily 30 minutes before breakfast (for constipation)   eSigned:  Iva Boop, MD, [facility] 03/18/2012 11:42 AM   cc: Earley Favor, MD and The Patient

## 2012-03-18 NOTE — Patient Instructions (Addendum)
Your colonoscpy exam showed a very small polyp but was ok otherwise.  I recommend a trial of Linzess 145 micrograms every day 30 minutes before first meal of the day to see if that helps your constipation. Samples will be provided by my office. If that is helpful then I will prescribe it. Call back and let me know.  Thank you for choosing me and [facility].  Iva Boop, MD, [facility] Discharge instructions given with verbal understanding. Handout on polyp given. Resume previous medications. YOU HAD AN ENDOSCOPIC PROCEDURE TODAY AT THE [facility]: Refer to the procedure report that was given to you for any specific questions about what was found during the examination.  If the procedure report does not answer your questions, please call your gastroenterologist to clarify.  If you requested that your care partner not be given the details of your procedure findings, then the procedure report has been included in a sealed envelope for you to review at your convenience later.  YOU SHOULD EXPECT: Some feelings of bloating in the abdomen. Passage of more gas than usual.  Walking can help get rid of the air that was put into your GI tract during the procedure and reduce the bloating. If you had a lower endoscopy (such as a colonoscopy or flexible sigmoidoscopy) you may notice spotting of blood in your stool or on the toilet paper. If you underwent a bowel prep for your procedure, then you may not have a normal bowel movement for a few days.  DIET: Your first meal following the procedure should be a light meal and then it is ok to progress to your normal diet.  A half-sandwich or bowl of soup is an example of a good first meal.  Heavy or fried foods are harder to digest and may make you feel nauseous or bloated.  Likewise meals heavy in dairy and vegetables can cause extra gas to form and this can also increase the bloating.  Drink plenty of fluids but you should avoid alcoholic  beverages for 24 hours.  ACTIVITY: Your care partner should take you home directly after the procedure.  You should plan to take it easy, moving slowly for the rest of the day.  You can resume normal activity the day after the procedure however you should NOT DRIVE or use heavy machinery for 24 hours (because of the sedation medicines used during the test).    SYMPTOMS TO REPORT IMMEDIATELY: A gastroenterologist can be reached at any hour.  During normal business hours, 8:30 AM to 5:00 PM Monday through Friday, call [phone].  After hours and on weekends, please call the GI answering service at [phone] who will take a message and have the physician on call contact you.   Following lower endoscopy (colonoscopy or flexible sigmoidoscopy):  Excessive amounts of blood in the stool  Significant tenderness or worsening of abdominal pains  Swelling of the abdomen that is new, acute  Fever of 100F or higher  FOLLOW UP: If any biopsies were taken you will be contacted by phone or by letter within the next 1-3 weeks.  Call your gastroenterologist if you have not heard about the biopsies in 3 weeks.  Our staff will call the home number listed on your records the next business day following your procedure to check on you and address any questions or concerns that you may have at that time regarding the information given to you following your procedure. This is a courtesy call  and so if there is no answer at the home number and we have not heard from you through the emergency physician on call, we will assume that you have returned to your regular daily activities without incident.  SIGNATURES/CONFIDENTIALITY: You and/or your care partner have signed paperwork which will be entered into your electronic medical record.  These signatures attest to the fact that that the information above on your After Visit Summary has been reviewed and is understood.  Full responsibility of the confidentiality of  this discharge information lies with you and/or your care-partner.

## 2012-03-18 NOTE — Progress Notes (Signed)
Opened in error, samples already documented.

## 2012-03-19 ENCOUNTER — Telehealth: Payer: Self-pay | Admitting: *Deleted

## 2012-03-19 NOTE — Telephone Encounter (Signed)
  Follow up Call-  Call back number 03/18/2012  Post procedure Call Back phone  # [phone]  Permission to leave phone message Yes     Patient questions:  Do you have a fever, pain , or abdominal swelling? no Pain Score  0 *  Have you tolerated food without any problems? yes  Have you been able to return to your normal activities? yes  Do you have any questions about your discharge instructions: Diet   no Medications  no Follow up visit  no  Do you have questions or concerns about your Care? no  Actions: * If pain score is 4 or above: No action needed, pain <4.

## 2012-03-24 ENCOUNTER — Encounter: Payer: Self-pay | Admitting: Internal Medicine

## 2012-03-24 DIAGNOSIS — Z860100 Personal history of colon polyps, unspecified: Secondary | ICD-10-CM

## 2012-03-24 HISTORY — DX: Personal history of colonic polyps: Z86.010

## 2012-03-24 HISTORY — DX: Personal history of colon polyps, unspecified: Z86.0100

## 2012-03-24 NOTE — Progress Notes (Signed)
Quick Note:  Diminutive cecal adenoma removed Repeat colonoscopy 03/2017 approximately ______

## 2012-05-14 DIAGNOSIS — E041 Nontoxic single thyroid nodule: Secondary | ICD-10-CM

## 2012-05-14 HISTORY — DX: Nontoxic single thyroid nodule: E04.1

## 2012-06-02 ENCOUNTER — Telehealth: Payer: Self-pay | Admitting: Family Medicine

## 2012-06-02 ENCOUNTER — Encounter: Payer: Self-pay | Admitting: Family Medicine

## 2012-06-02 NOTE — Telephone Encounter (Signed)
I called and scheduled patient to be seen on Friday 06-04-12.

## 2012-06-02 NOTE — Telephone Encounter (Signed)
Patient was told that you have received the records and that you would review them and get back with him around lunch time.

## 2012-06-02 NOTE — Telephone Encounter (Signed)
Pls call pt and recommend office visit with me to discuss this and we'll set things up at that time.-thx

## 2012-06-04 ENCOUNTER — Ambulatory Visit: Payer: Managed Care, Other (non HMO) | Admitting: Family Medicine

## 2012-06-04 ENCOUNTER — Ambulatory Visit (INDEPENDENT_AMBULATORY_CARE_PROVIDER_SITE_OTHER): Payer: Managed Care, Other (non HMO) | Admitting: Family Medicine

## 2012-06-04 ENCOUNTER — Encounter: Payer: Self-pay | Admitting: Family Medicine

## 2012-06-04 VITALS — BP 138/88 | HR 67 | Temp 98.6°F | Ht 69.5 in | Wt 211.0 lb

## 2012-06-04 DIAGNOSIS — E041 Nontoxic single thyroid nodule: Secondary | ICD-10-CM

## 2012-06-04 LAB — TSH: TSH: 0.89 u[IU]/mL (ref 0.35–5.50)

## 2012-06-04 LAB — T3: T3, Total: 102.6 ng/dL (ref 80.0–204.0)

## 2012-06-04 NOTE — Assessment & Plan Note (Signed)
Asymptomatic.  Found on screening u/s set up via his employer. Will check TSH, free T4 and T3 total today. Will arrange RIU and scan and will refer to endocrinology. Pt's questions answered and he agrees with this plan.

## 2012-06-04 NOTE — Progress Notes (Signed)
OFFICE NOTE  06/04/2012  CC:  Chief Complaint  Patient presents with  . Advice Only    discuss records faxed earlier regarding nodule     HPI: Patient is a 51 y.o. Caucasian male who is here to discuss a recently detected thyroid nodule on a screening thyroid ultrasound done via his employer.  He reports feeling fine. ROS: no fatigue or malaise, no heat or cold intolerance, no neck pain, no fevers, no rashes, no joint or muscle aches.  Pertinent PMH:  Past Medical History  Diagnosis Date  . Multiple sclerosis 1992    Remission since 1996 on no meds.  Initial presentation was gait/balance and leg weakness sx's.  . Allergic rhinitis     Much improved with allergy immunotherapy (molds primarily)  . Asthma     no inhaler requirement since allergies under control with immunotherapy  . PUD (peptic ulcer disease)     Bleeding ulcer 1978  . BPH (benign prostatic hypertrophy)     Rapaflo helps.  PSA's have been normal per pt report.  . OSA on CPAP 2005  . Chronic constipation   . Personal history of colonic polyps 03/24/2012    03/2012 - 5 mm cecal adenoma  . Thyroid nodule 05/2012    Right lobe 75mmX14mm nodule found on screening thyroid and carotid u/s done through his employer   Past Surgical History  Procedure Date  . Femur fracture surgery 1978    with rod insertion.  Rod removal 1979  . Appendectomy 1986  . Colonoscopy 2006, 2013     diminutive hyperplastic cecal polyp ([place]); diminutive cecal adenoma removed 2013, recall 2018    MEDS:  Outpatient Prescriptions Prior to Visit  Medication Sig Dispense Refill  . Arginine 1000 MG TABS Take 1-2 tablets by mouth daily.        . Multiple Vitamin (MULTIVITAMIN) tablet Take 1 tablet by mouth daily.        . NON FORMULARY ALLERGY INJECTIONS.  Once weekly      . Omega-3 Fatty Acids (FISH OIL) 1000 MG CAPS Take 1 capsule by mouth 2 (two) times daily.        Marland Kitchen senna (SENOKOT) 8.6 MG tablet Take 2 tablets by mouth daily.       .  silodosin (RAPAFLO) 8 MG CAPS capsule Take 1 capsule (8 mg total) by mouth daily.  90 capsule  1  . vitamin C (ASCORBIC ACID) 500 MG tablet Take 500 mg by mouth daily.        Marland Kitchen Phenylephrine-Acetaminophen (SINUS CONGESTION/PAIN DAYTIME PO) Take 1 tablet by mouth daily.      . Linaclotide (LINZESS) 145 MCG CAPS Take 1 capsule (145 mcg total) by mouth daily. #[id number] Exp 03/14  12 capsule  0  . lubiprostone (AMITIZA) 24 MCG capsule Take 1 capsule (24 mcg total) by mouth 2 (two) times daily with a meal.  30 capsule  1    PE: Blood pressure 138/88, pulse 67, temperature 98.6 F (37 C), temperature source Temporal, height 5' 9.5" (1.765 m), weight 211 lb (95.709 kg). Gen: Alert, well appearing.  Patient is oriented to person, place, time, and situation. Neck - No masses or thyromegaly or limitation in range of motion CV: RRR, no m/r/g.   LUNGS: CTA bilat, nonlabored resps, good aeration in all lung fields.  IMPRESSION AND PLAN:  Thyroid nodule Asymptomatic.  Found on screening u/s set up via his employer. Will check TSH, free T4 and T3 total today. Will arrange RIU  and scan and will refer to endocrinology. Pt's questions answered and he agrees with this plan.  An After Visit Summary was printed and given to the patient.  FOLLOW UP: prn

## 2012-06-08 ENCOUNTER — Ambulatory Visit: Payer: Managed Care, Other (non HMO) | Admitting: Family Medicine

## 2012-06-15 ENCOUNTER — Encounter (HOSPITAL_COMMUNITY)
Admission: RE | Admit: 2012-06-15 | Discharge: 2012-06-15 | Disposition: A | Discharge status: Home / Self Care | Payer: Managed Care, Other (non HMO) | Source: Ambulatory Visit | Attending: Family Medicine | Admitting: Family Medicine

## 2012-06-15 ENCOUNTER — Encounter (HOSPITAL_COMMUNITY): Payer: Self-pay

## 2012-06-15 DIAGNOSIS — E041 Nontoxic single thyroid nodule: Secondary | ICD-10-CM | POA: Insufficient documentation

## 2012-06-15 MED ORDER — SODIUM IODIDE I 131 CAPSULE
17.0000 | Freq: Once | INTRAVENOUS | Status: AC | PRN
Start: 2012-06-15 — End: 2012-06-15
  Administered 2012-06-15: 17 via ORAL

## 2012-06-16 ENCOUNTER — Encounter (HOSPITAL_COMMUNITY)
Admission: RE | Admit: 2012-06-16 | Discharge: 2012-06-16 | Disposition: A | Discharge status: Home / Self Care | Payer: Managed Care, Other (non HMO) | Source: Ambulatory Visit | Attending: Family Medicine | Admitting: Family Medicine

## 2012-06-16 ENCOUNTER — Encounter (HOSPITAL_COMMUNITY): Payer: Self-pay

## 2012-06-16 MED ORDER — SODIUM PERTECHNETATE TC 99M INJECTION
10.0000 | Freq: Once | INTRAVENOUS | Status: AC | PRN
  Administered 2012-06-16: 9.8 via INTRAVENOUS

## 2012-06-17 ENCOUNTER — Encounter: Payer: Self-pay | Admitting: Family Medicine

## 2012-07-14 DIAGNOSIS — N400 Enlarged prostate without lower urinary tract symptoms: Secondary | ICD-10-CM

## 2012-07-14 HISTORY — DX: Benign prostatic hyperplasia without lower urinary tract symptoms: N40.0

## 2012-07-16 ENCOUNTER — Encounter: Payer: Self-pay | Admitting: Family Medicine

## 2012-07-21 ENCOUNTER — Other Ambulatory Visit: Payer: Self-pay | Admitting: Urology

## 2012-08-05 ENCOUNTER — Ambulatory Visit (INDEPENDENT_AMBULATORY_CARE_PROVIDER_SITE_OTHER): Payer: BC Managed Care – PPO | Admitting: Family Medicine

## 2012-08-05 ENCOUNTER — Encounter: Payer: Self-pay | Admitting: Family Medicine

## 2012-08-05 VITALS — BP 120/83 | HR 97 | Temp 101.1°F | Wt 217.0 lb

## 2012-08-05 DIAGNOSIS — J111 Influenza due to unidentified influenza virus with other respiratory manifestations: Secondary | ICD-10-CM

## 2012-08-05 DIAGNOSIS — J45909 Unspecified asthma, uncomplicated: Secondary | ICD-10-CM

## 2012-08-05 MED ORDER — OSELTAMIVIR PHOSPHATE 75 MG PO CAPS: 75.0000 mg | ORAL_CAPSULE | Freq: Two times a day (BID) | ORAL | Status: DC

## 2012-08-05 MED ORDER — HYDROCODONE-HOMATROPINE 5-1.5 MG/5ML PO SYRP: ORAL_SOLUTION | ORAL | Status: DC

## 2012-08-05 MED ORDER — PREDNISONE 20 MG PO TABS: 20.0000 mg | ORAL_TABLET | Freq: Every day | ORAL | Status: DC

## 2012-08-05 MED ORDER — PREDNISONE 20 MG PO TABS: ORAL_TABLET | ORAL | Status: DC

## 2012-08-05 NOTE — Progress Notes (Signed)
OFFICE NOTE  08/05/2012  CC:  Chief Complaint  Patient presents with  . Cough    began yesterday PM, fever 102 at home; taking tylenol severe sinus     HPI: Patient is a 52 y.o. Caucasian male who is here for fever and cough. Pt presents complaining of respiratory symptoms for 24 hours.  Primary symptoms are: cough yesterday, fever to 102 today.  Nasal congestion/pain, poor sleep.  Worst symptoms seems to be the fever, nasal/sinus congestion.  Lately the symptoms seem to be worsening.  Chest tight/wheezing noted. Pertinent negatives: No ST.  Mild HA in sinus regions. Symptoms made worse by anything that makes him breath deep.  Symptoms improved by nothing (tylenol sinus). Smoker? no Recent sick contact? None known Muscle or joint aches? Yes+ fatigue Flu shot this season at least 2 wks ago? yes  Additional ROS: no n/v/d or abdominal pain.  No rash.  No neck stiffness.   +Mild fatigue.  +Mild appetite loss.   Pertinent PMH:  Past Medical History  Diagnosis Date  . Multiple sclerosis 1992    Remission since 1996 on no meds.  Initial presentation was gait/balance and leg weakness sx's.  . Allergic rhinitis     Much improved with allergy immunotherapy (molds primarily)  . Asthma     no inhaler requirement since allergies under control with immunotherapy  . PUD (peptic ulcer disease)     Bleeding ulcer 1978  . BPH (benign prostatic hypertrophy)     Rapaflo helps.  PSA's have been normal.  Cysto 06/2012 confirmed trilobed BPH--plan made for TURP procedure  . OSA on CPAP 2005  . Chronic constipation   . Personal history of colonic polyps 03/24/2012    03/2012 - 5 mm cecal adenoma  . Thyroid nodule 05/2012    Right lobe 27mmX14mm nodule found on screening thyroid and carotid u/s done through his employer.  Pt was euthyroid at that time.  A radioactive iodine uptake and scan was normal 06/2012.    MEDS:  Outpatient Prescriptions Prior to Visit  Medication Sig Dispense Refill  .  Arginine 1000 MG TABS Take 1-2 tablets by mouth daily.        . Multiple Vitamin (MULTIVITAMIN) tablet Take 1 tablet by mouth daily.        . NON FORMULARY ALLERGY INJECTIONS.  Once weekly      . Omega-3 Fatty Acids (FISH OIL) 1000 MG CAPS Take 1 capsule by mouth 2 (two) times daily.        Marland Kitchen Phenylephrine-Acetaminophen (SINUS CONGESTION/PAIN DAYTIME PO) Take 1 tablet by mouth daily.      Marland Kitchen senna (SENOKOT) 8.6 MG tablet Take 2 tablets by mouth daily.       . silodosin (RAPAFLO) 8 MG CAPS capsule Take 1 capsule (8 mg total) by mouth daily.  90 capsule  1  . vitamin C (ASCORBIC ACID) 500 MG tablet Take 500 mg by mouth daily.        Last reviewed on 08/05/2012  2:31 PM by Jeoffrey Massed, MD  PE: Blood pressure 120/83, pulse 97, temperature 101.1 F (38.4 C), temperature source Temporal, weight 217 lb (98.431 kg), SpO2 96.00%. VS: noted-febrile here today Gen: alert, NAD, NONTOXIC APPEARING. HEENT: eyes without injection, drainage, or swelling.  Ears: EACs clear, TMs with normal light reflex and landmarks.  Nose: Clear rhinorrhea, with some dried, crusty exudate adherent to mildly injected mucosa.  No purulent d/c.  No paranasal sinus TTP.  No facial swelling.  Throat  and mouth without focal lesion.  No pharyngial swelling, erythema, or exudate.   Neck: supple, no LAD.   LUNGS: Bilat diffuse coarse exp wheezing with forced expiration, mildly prolonged exp phase, lost of post-exhalation coughing.  No inspiratory crackles. Nonlabored resps.  Aeration is decent/symmetric. CV: RRR, no m/r/g. EXT: no c/c/e SKIN: no rash   IMPRESSION AND PLAN:  Influenza-like illness ILI + asthmatic bronchitis. Tamiflu 75mg  bid x 5d. Prednisone 40mg  qd x 5d. Nonsedating OTC antihist+ decong combo, afrin qhs x 3d. Hycodan susp q6h prn. Fluids, rest.   An After Visit Summary was printed and given to the patient.  FOLLOW UP: prn

## 2012-08-05 NOTE — Patient Instructions (Signed)
Allegra D or Zyrtec D or Claritin D "behind the counter"--as directed on the package. Afrin nasal spray at bedtime. Continue saline nasal spray.

## 2012-08-05 NOTE — Assessment & Plan Note (Signed)
ILI + asthmatic bronchitis. Tamiflu 75mg  bid x 5d. Prednisone 40mg  qd x 5d. Nonsedating OTC antihist+ decong combo, afrin qhs x 3d. Hycodan susp q6h prn. Fluids, rest.

## 2012-08-16 ENCOUNTER — Encounter (HOSPITAL_COMMUNITY): Payer: Self-pay | Admitting: Pharmacy Technician

## 2012-08-17 NOTE — Patient Instructions (Signed)
Shakim Faith  08/17/2012   Your procedure is scheduled on:  08/25/12   Report to [facility] at  0630  AM.  Call this number if you have problems the morning of surgery: [phone]   Remember:   Do not eat food or drink liquids after midnight.   Take these medicines the morning of surgery with A SIP OF WATER:    Do not wear jewelry,   Do not wear lotions, powders, or perfumes.  . Men may shave face and neck.  Do not bring valuables to the hospital.  Contacts, dentures or bridgework may not be worn into surgery.  Leave suitcase in the car. After surgery it may be brought to your room.  For patients admitted to the hospital, checkout time is 11:00 AM the day of  discharge.       SEE CHG INSTRUCTION SHEET    Please read over the following fact sheets that you were given: MRSA Information, coughing and deep breathing exercises, leg exercises               Failure to comply with these instructions may result in cancellation of your surgery.                Patient Signature ____________________________              Nurse Signature _____________________________

## 2012-08-18 ENCOUNTER — Encounter (HOSPITAL_COMMUNITY)
Admission: RE | Admit: 2012-08-18 | Discharge: 2012-08-18 | Disposition: A | Discharge status: Home / Self Care | Payer: BC Managed Care – PPO | Source: Ambulatory Visit | Attending: Urology | Admitting: Urology

## 2012-08-18 ENCOUNTER — Encounter (HOSPITAL_COMMUNITY): Payer: Self-pay

## 2012-08-18 HISTORY — DX: Malignant (primary) neoplasm, unspecified: C80.1

## 2012-08-18 LAB — BASIC METABOLIC PANEL
BUN: 19 mg/dL (ref 6–23)
CO2: 26 mEq/L (ref 19–32)
Calcium: 9.6 mg/dL (ref 8.4–10.5)
Creatinine, Ser: 0.97 mg/dL (ref 0.50–1.35)
Glucose, Bld: 108 mg/dL — ABNORMAL HIGH (ref 70–99)

## 2012-08-18 LAB — CBC
Hemoglobin: 13.7 g/dL (ref 13.0–17.0)
MCH: 30.6 pg (ref 26.0–34.0)
MCV: 91.1 fL (ref 78.0–100.0)
RBC: 4.48 MIL/uL (ref 4.22–5.81)

## 2012-08-24 MED ORDER — GENTAMICIN SULFATE 40 MG/ML IJ SOLN
400.0000 mg | Freq: Once | INTRAVENOUS | Status: AC
  Administered 2012-08-25: 400 mg via INTRAVENOUS
  Filled 2012-08-24: qty 10

## 2012-08-25 ENCOUNTER — Encounter (HOSPITAL_COMMUNITY): Payer: Self-pay | Admitting: *Deleted

## 2012-08-25 ENCOUNTER — Encounter (HOSPITAL_COMMUNITY)
Admission: RE | Disposition: A | Discharge status: Home / Self Care | Payer: Self-pay | Source: Ambulatory Visit | Attending: Urology

## 2012-08-25 ENCOUNTER — Observation Stay (HOSPITAL_COMMUNITY)
Admission: RE | Admit: 2012-08-25 | Discharge: 2012-08-26 | Disposition: A | Discharge status: Home / Self Care | Payer: BC Managed Care – PPO | Source: Ambulatory Visit | Attending: Urology | Admitting: Urology

## 2012-08-25 ENCOUNTER — Encounter (HOSPITAL_COMMUNITY): Payer: Self-pay | Admitting: Anesthesiology

## 2012-08-25 ENCOUNTER — Inpatient Hospital Stay (HOSPITAL_COMMUNITY): Payer: BC Managed Care – PPO | Admitting: Anesthesiology

## 2012-08-25 DIAGNOSIS — R3915 Urgency of urination: Secondary | ICD-10-CM | POA: Insufficient documentation

## 2012-08-25 DIAGNOSIS — N4289 Other specified disorders of prostate: Secondary | ICD-10-CM | POA: Insufficient documentation

## 2012-08-25 DIAGNOSIS — N138 Other obstructive and reflux uropathy: Principal | ICD-10-CM | POA: Insufficient documentation

## 2012-08-25 DIAGNOSIS — N401 Enlarged prostate with lower urinary tract symptoms: Principal | ICD-10-CM | POA: Insufficient documentation

## 2012-08-25 DIAGNOSIS — G4733 Obstructive sleep apnea (adult) (pediatric): Secondary | ICD-10-CM | POA: Insufficient documentation

## 2012-08-25 DIAGNOSIS — G35 Multiple sclerosis: Secondary | ICD-10-CM | POA: Insufficient documentation

## 2012-08-25 DIAGNOSIS — Z01812 Encounter for preprocedural laboratory examination: Secondary | ICD-10-CM | POA: Insufficient documentation

## 2012-08-25 DIAGNOSIS — K219 Gastro-esophageal reflux disease without esophagitis: Secondary | ICD-10-CM | POA: Insufficient documentation

## 2012-08-25 HISTORY — PX: TRANSURETHRAL RESECTION OF PROSTATE: SHX73

## 2012-08-25 SURGERY — TRANSURETHRAL RESECTION OF THE PROSTATE WITH GYRUS INSTRUMENTS
Anesthesia: General | Wound class: Clean Contaminated

## 2012-08-25 MED ORDER — HYDROMORPHONE HCL PF 1 MG/ML IJ SOLN
INTRAMUSCULAR | Status: AC
  Filled 2012-08-25: qty 1

## 2012-08-25 MED ORDER — ONDANSETRON HCL 4 MG/2ML IJ SOLN: 4.0000 mg | INTRAMUSCULAR | Status: DC | PRN

## 2012-08-25 MED ORDER — HYDROMORPHONE HCL PF 1 MG/ML IJ SOLN
0.2500 mg | INTRAMUSCULAR | Status: DC | PRN
  Administered 2012-08-25 (×4): 0.5 mg via INTRAVENOUS

## 2012-08-25 MED ORDER — PROMETHAZINE HCL 25 MG/ML IJ SOLN: 6.2500 mg | INTRAMUSCULAR | Status: DC | PRN

## 2012-08-25 MED ORDER — LACTATED RINGERS IV SOLN: INTRAVENOUS | Status: DC

## 2012-08-25 MED ORDER — OXYCODONE-ACETAMINOPHEN 5-325 MG PO TABS
1.0000 | ORAL_TABLET | ORAL | Status: DC | PRN
  Administered 2012-08-25 – 2012-08-26 (×5): 1 via ORAL
  Filled 2012-08-25 (×5): qty 1

## 2012-08-25 MED ORDER — SENNA 8.6 MG PO TABS
1.0000 | ORAL_TABLET | Freq: Two times a day (BID) | ORAL | Status: DC
  Administered 2012-08-25 – 2012-08-26 (×3): 8.6 mg via ORAL
  Filled 2012-08-25 (×7): qty 1

## 2012-08-25 MED ORDER — DOCUSATE SODIUM 100 MG PO CAPS
100.0000 mg | ORAL_CAPSULE | Freq: Two times a day (BID) | ORAL | Status: DC
  Administered 2012-08-25 – 2012-08-26 (×3): 100 mg via ORAL
  Filled 2012-08-25 (×4): qty 1

## 2012-08-25 MED ORDER — MIDAZOLAM HCL 5 MG/5ML IJ SOLN
INTRAMUSCULAR | Status: DC | PRN
  Administered 2012-08-25: 2 mg via INTRAVENOUS

## 2012-08-25 MED ORDER — KCL IN DEXTROSE-NACL 20-5-0.45 MEQ/L-%-% IV SOLN
INTRAVENOUS | Status: DC
  Administered 2012-08-25 – 2012-08-26 (×2): via INTRAVENOUS
  Filled 2012-08-25 (×3): qty 1000

## 2012-08-25 MED ORDER — OXYBUTYNIN CHLORIDE 5 MG PO TABS
5.0000 mg | ORAL_TABLET | Freq: Three times a day (TID) | ORAL | Status: DC | PRN
  Filled 2012-08-25: qty 1

## 2012-08-25 MED ORDER — PROPOFOL 10 MG/ML IV BOLUS
INTRAVENOUS | Status: DC | PRN
  Administered 2012-08-25: 150 mg via INTRAVENOUS

## 2012-08-25 MED ORDER — DEXAMETHASONE SODIUM PHOSPHATE 10 MG/ML IJ SOLN
INTRAMUSCULAR | Status: DC | PRN
  Administered 2012-08-25: 10 mg via INTRAVENOUS

## 2012-08-25 MED ORDER — LORATADINE 10 MG PO TABS
10.0000 mg | ORAL_TABLET | Freq: Every day | ORAL | Status: DC
  Administered 2012-08-25 – 2012-08-26 (×2): 10 mg via ORAL
  Filled 2012-08-25 (×2): qty 1

## 2012-08-25 MED ORDER — LORATADINE-PSEUDOEPHEDRINE ER 5-120 MG PO TB12
1.0000 | ORAL_TABLET | Freq: Two times a day (BID) | ORAL | Status: DC
Start: 2012-08-25 — End: 2012-08-25

## 2012-08-25 MED ORDER — PSEUDOEPHEDRINE HCL ER 120 MG PO TB12
120.0000 mg | ORAL_TABLET | Freq: Two times a day (BID) | ORAL | Status: DC
  Administered 2012-08-25 – 2012-08-26 (×3): 120 mg via ORAL
  Filled 2012-08-25 (×4): qty 1

## 2012-08-25 MED ORDER — FENTANYL CITRATE 0.05 MG/ML IJ SOLN
INTRAMUSCULAR | Status: DC | PRN
  Administered 2012-08-25: 50 ug via INTRAVENOUS
  Administered 2012-08-25: 100 ug via INTRAVENOUS
  Administered 2012-08-25: 25 ug via INTRAVENOUS
  Administered 2012-08-25: 50 ug via INTRAVENOUS
  Administered 2012-08-25: 25 ug via INTRAVENOUS

## 2012-08-25 MED ORDER — ALBUTEROL SULFATE HFA 108 (90 BASE) MCG/ACT IN AERS
1.0000 | INHALATION_SPRAY | Freq: Four times a day (QID) | RESPIRATORY_TRACT | Status: DC | PRN
  Filled 2012-08-25: qty 6.7

## 2012-08-25 MED ORDER — LACTATED RINGERS IV SOLN
INTRAVENOUS | Status: DC | PRN
  Administered 2012-08-25: 08:00:00 via INTRAVENOUS

## 2012-08-25 MED ORDER — SODIUM CHLORIDE 0.9 % IR SOLN
Status: DC | PRN
  Administered 2012-08-25: 18000 mL via INTRAVESICAL

## 2012-08-25 MED ORDER — HYDROMORPHONE HCL PF 1 MG/ML IJ SOLN: 0.5000 mg | INTRAMUSCULAR | Status: DC | PRN

## 2012-08-25 MED ORDER — ONDANSETRON HCL 4 MG/2ML IJ SOLN
INTRAMUSCULAR | Status: DC | PRN
  Administered 2012-08-25: 4 mg via INTRAVENOUS

## 2012-08-25 SURGICAL SUPPLY — 22 items
BAG URINE DRAINAGE (UROLOGICAL SUPPLIES) ×2 IMPLANT
BAG URO CATCHER STRL LF (DRAPE) ×2 IMPLANT
BLADE SURG 15 STRL LF DISP TIS (BLADE) IMPLANT
BLADE SURG 15 STRL SS (BLADE)
CATH FOLEY 3WAY 30CC 22FR (CATHETERS) ×2 IMPLANT
CLOTH BEACON ORANGE TIMEOUT ST (SAFETY) ×2 IMPLANT
DRAPE CAMERA CLOSED 9X96 (DRAPES) ×2 IMPLANT
ELECT BUTTON HF 24-28F 2 30DE (ELECTRODE) ×2 IMPLANT
ELECT LOOP MED HF 24F 12D (CUTTING LOOP) IMPLANT
ELECT LOOP MED HF 24F 12D CBL (CLIP) IMPLANT
ELECT RESECT VAPORIZE 12D CBL (ELECTRODE) IMPLANT
GLOVE BIOGEL M STRL SZ7.5 (GLOVE) ×2 IMPLANT
GOWN STRL REIN XL XLG (GOWN DISPOSABLE) ×2 IMPLANT
HOLDER FOLEY CATH W/STRAP (MISCELLANEOUS) ×2 IMPLANT
KIT ASPIRATION TUBING (SET/KITS/TRAYS/PACK) IMPLANT
MANIFOLD NEPTUNE II (INSTRUMENTS) ×2 IMPLANT
PACK CYSTO (CUSTOM PROCEDURE TRAY) ×2 IMPLANT
PLUG CATH AND CAP STER (CATHETERS) ×2 IMPLANT
SUT ETHILON 3 0 PS 1 (SUTURE) IMPLANT
SYR 30ML LL (SYRINGE) ×2 IMPLANT
SYRINGE IRR TOOMEY STRL 70CC (SYRINGE) ×2 IMPLANT
TUBING CONNECTING 10 (TUBING) ×2 IMPLANT

## 2012-08-25 NOTE — Preoperative (Signed)
Beta Blockers   Reason not to administer Beta Blockers:Not Applicable 

## 2012-08-25 NOTE — Brief Op Note (Signed)
08/25/2012  9:29 AM  PATIENT:  Brian Walsh  52 y.o. male  PRE-OPERATIVE DIAGNOSIS:  BENIGN PROSTATIC HYPERTROPHY  POST-OPERATIVE DIAGNOSIS:  BENIGN PROSTATIC HYPERTROPHY  PROCEDURE:  Procedure(s): TRANSURETHRAL RESECTION OF THE PROSTATE WITH GYRUS INSTRUMENTS (N/A)  SURGEON:  Surgeon(s) and Role:    * Sebastian Ache, MD - Primary  PHYSICIAN ASSISTANT:   ASSISTANTS: none   ANESTHESIA:   general  EBL:  Total I/O In: 600 [I.V.:600] Out: -   BLOOD ADMINISTERED:none  DRAINS: 75F 3 way foley to NS continuous irrigation   LOCAL MEDICATIONS USED:  NONE  SPECIMEN:  Source of Specimen:  Prostate Chips  DISPOSITION OF SPECIMEN:  PATHOLOGY  COUNTS:  YES  TOURNIQUET:  * No tourniquets in log *  DICTATION: .Other Dictation: Dictation Number [id number]  PLAN OF CARE: Admit for overnight observation  PATIENT DISPOSITION:  PACU - hemodynamically stable.   Delay start of Pharmacological VTE agent (>24hrs) due to surgical blood loss or risk of bleeding: yes

## 2012-08-25 NOTE — Anesthesia Postprocedure Evaluation (Signed)
  Anesthesia Post-op Note  Patient: Brian Walsh  Procedure(s) Performed: Procedure(s) (LRB): TRANSURETHRAL RESECTION OF THE PROSTATE WITH GYRUS INSTRUMENTS (N/A)  Patient Location: PACU  Anesthesia Type: General  Level of Consciousness: awake and alert   Airway and Oxygen Therapy: Patient Spontanous Breathing  Post-op Pain: mild  Post-op Assessment: Post-op Vital signs reviewed, Patient's Cardiovascular Status Stable, Respiratory Function Stable, Patent Airway and No signs of Nausea or vomiting  Last Vitals:  Filed Vitals:   08/25/12 1000  BP: 133/83  Pulse: 69  Temp:   Resp: 14    Post-op Vital Signs: stable   Complications: No apparent anesthesia complications

## 2012-08-25 NOTE — Anesthesia Preprocedure Evaluation (Signed)
Anesthesia Evaluation  Patient identified by MRN, date of birth, ID band Patient awake    Reviewed: Allergy & Precautions, H&P , NPO status , Patient's Chart, lab work & pertinent test results  Airway Mallampati: II TM Distance: >3 FB Neck ROM: Full    Dental no notable dental hx.    Pulmonary asthma , sleep apnea and Continuous Positive Airway Pressure Ventilation ,  breath sounds clear to auscultation  Pulmonary exam normal       Cardiovascular negative cardio ROS  Rhythm:Regular Rate:Normal     Neuro/Psych Multiple Sclerosis. negative psych ROS   GI/Hepatic negative GI ROS, Neg liver ROS,   Endo/Other  negative endocrine ROS  Renal/GU negative Renal ROS  negative genitourinary   Musculoskeletal negative musculoskeletal ROS (+)   Abdominal (+) + obese,   Peds negative pediatric ROS (+)  Hematology negative hematology ROS (+)   Anesthesia Other Findings   Reproductive/Obstetrics negative OB ROS                           Anesthesia Physical Anesthesia Plan  ASA: III  Anesthesia Plan: General   Post-op Pain Management:    Induction: Intravenous  Airway Management Planned: LMA  Additional Equipment:   Intra-op Plan:   Post-operative Plan: Extubation in OR  Informed Consent: I have reviewed the patients History and Physical, chart, labs and discussed the procedure including the risks, benefits and alternatives for the proposed anesthesia with the patient or authorized representative who has indicated his/her understanding and acceptance.   Dental advisory given  Plan Discussed with: CRNA  Anesthesia Plan Comments:         Anesthesia Quick Evaluation

## 2012-08-25 NOTE — Transfer of Care (Signed)
Immediate Anesthesia Transfer of Care Note  Patient: Brian Walsh  Procedure(s) Performed: Procedure(s): TRANSURETHRAL RESECTION OF THE PROSTATE WITH GYRUS INSTRUMENTS (N/A)  Patient Location: PACU  Anesthesia Type:General  Level of Consciousness: awake, alert , oriented and patient cooperative  Airway & Oxygen Therapy: Patient Spontanous Breathing and Patient connected to face mask oxygen  Post-op Assessment: Report given to PACU RN and Post -op Vital signs reviewed and stable  Post vital signs: Reviewed and stable  Complications: No apparent anesthesia complications

## 2012-08-25 NOTE — H&P (Signed)
Brian Walsh is an 52 y.o. male.    Chief Complaint: Pre-op TURP  HPI:   1 - Lower Urinary Tract Symptoms - Pt on Rapaflo for presumed BPE per Urologist in [place]. His symptoms are combination obscturvie (start/stop flow) and irritative (urgency with urge incontinnce to point of needing to change clothes). He does have h/o Multiple Sclerosis that is in remission, but has caused LE symptoms such as foot drop. DRE 06/2012 40gm.UDS 06/2012 with impressive outlet obstruction with PDet 115 at QMax of 5, no DSD or obvious neurologic sequelae. Cysto 06/2012  with trilobar hypertrophy, no stricture.  2 - Prostate Screening - No FHX prostate cancer. Recent PSA History 2013 - 1.66 / DRE normal 2012 - 1.73 / normal per report  PMH sig for Multiple Sclerosis (spontanous remission), OSA/CPAP, GERD.  Today Brian Walsh is seen to proceed with transurethral resection of prostate for his medication-refractory outlet obstruction. Most recent UA unimpressive for infection.  Past Medical History  Diagnosis Date  . Multiple sclerosis 1992    Remission since 1996 on no meds.  Initial presentation was gait/balance and leg weakness sx's.  . Allergic rhinitis     Much improved with allergy immunotherapy (molds primarily)  . Asthma     no inhaler requirement since allergies under control with immunotherapy  . PUD (peptic ulcer disease)     Bleeding ulcer 1978  . BPH (benign prostatic hypertrophy)     Rapaflo helps.  PSA's have been normal.  Cysto 06/2012 confirmed trilobed BPH--plan made for TURP procedure  . OSA on CPAP 2005  . Chronic constipation   . Personal history of colonic polyps 03/24/2012    03/2012 - 5 mm cecal adenoma  . Thyroid nodule 05/2012    Right lobe 2mmX14mm nodule found on screening thyroid and carotid u/s done through his employer.  Pt was euthyroid at that time.  A radioactive iodine uptake and scan was normal 06/2012.  Marland Kitchen Chronic kidney disease     urinary frequency  . Cancer     skin  cancer     Past Surgical History  Procedure Laterality Date  . Femur fracture surgery  1978    with rod insertion.  Rod removal 1979  . Appendectomy  1986  . Colonoscopy  2006, 2013     diminutive hyperplastic cecal polyp ([place]); diminutive cecal adenoma removed 2013, recall 2018    Family History  Problem Relation Age of Onset  . Breast cancer Mother   . Parkinsonism Father   . Stroke Father   . Colon polyps Father 30   Social History:  reports that he has never smoked. He has never used smokeless tobacco. He reports that  drinks alcohol. He reports that he does not use illicit drugs.  Allergies: No Known Allergies  No prescriptions prior to admission    No results found for this or any previous visit (from the past 48 hour(s)). No results found.  Review of Systems  Constitutional: Negative.  Negative for fever and chills.  HENT: Negative.   Eyes: Negative.   Respiratory: Negative.   Cardiovascular: Negative.   Gastrointestinal: Negative.   Genitourinary: Positive for urgency and frequency. Negative for hematuria and flank pain.  Musculoskeletal: Negative.   Skin: Negative.   Neurological: Negative.   Endo/Heme/Allergies: Negative.   Psychiatric/Behavioral: Negative.     Height 5\' 10"  (1.778 m). Physical Exam  Constitutional: He is oriented to person, place, and time. He appears well-developed and well-nourished.  HENT:  Head: Normocephalic and  atraumatic.  Eyes: EOM are normal. Pupils are equal, round, and reactive to light.  Neck: Normal range of motion. Neck supple.  Cardiovascular: Normal rate and regular rhythm.   Respiratory: Effort normal and breath sounds normal.  GI: Soft. Bowel sounds are normal.  Genitourinary: Penis normal.  Musculoskeletal: Normal range of motion.  Neurological: He is alert and oriented to person, place, and time.  Skin: Skin is warm and dry.  Psychiatric: He has a normal mood and affect. His behavior is normal. Judgment and  thought content normal.     Assessment/Plan  1 - Lower Urinary Tract Symptoms - UDS verifires non-neurologic outlet obstruction. Cysto confirms trilobar prostatic obstruction. He again wants to proceed with TURP  We re-discussed options for medical refractory prostatic outlet obstruction including TURP, TUNA, TUMT, Green-Light, Ho-LEP, and simple prostatectomy with their respective risks, benefits, and long-term outcomes data. Pt has opted for TURP. We discussed the typical peri-operative course with overnight admission and discharge with foley in place with subsequent office voiding trial few days later. We discussed risks including bleeding, infection, incontinence, need for repeat procedures / tissue regrowth over time as well as rare risks including DVT, PE, MI, CVA and mortality.   Proceed with TURP today.   Brian Walsh 08/25/2012, 6:48 AM

## 2012-08-25 NOTE — Op Note (Signed)
Brian Walsh, Brian Walsh NO.:  [id number]  MEDICAL RECORD NO.:  [id number]  LOCATION:  [facility]                         FACILITY:  [facility]  PHYSICIAN:  Sebastian Ache, MD     DATE OF BIRTH:  27-Feb-1961  DATE OF PROCEDURE:  08/25/2012 DATE OF DISCHARGE:                              OPERATIVE REPORT   DIAGNOSIS:  Refractory lower urinary tract symptoms and prostatic hypertrophy.  PROCEDURE:  Transurethral resection of the prostate.  ESTIMATED BLOOD LOSS:  100 mL.  COMPLICATIONS:  None.  SPECIMENS:  Prostate chips for permanent Pathology.  DRAINS: 1. A 22-French 3-way Foley catheter to Normal saline irrigation at 1 drop per second, efflux light pink.  INDICATION:  Brian Walsh is a pleasant 52 year old gentleman with history of progressive urinary tract symptoms mostly obstructive in type.  He has been on maximal medical therapy with alpha blockers and 5- alpha reductase inhibitors and these symptoms have been refractory.  He does have a history of multiple sclerosis which has been well-controlled and is in spontaneous remission for years.  Given this questionable neurologic cause as well he underwent urodynamics, which corroborated obstructive pattern with high pressure low flow.  An operative cystoscopy corroborated trilobar prostatic hypertrophy as the likely inciting obstruction.  Options were discussed including transurethral resection of the prostate.  He wished to proceed.  Informed consent was obtained and placed in medical record.  PROCEDURE IN DETAIL:  The patient being Brian Walsh, procedure being transurethral resection of the prostate was confirmed, procedure was carried out.  Time-out was performed.  Intravenous antibiotics were administered.  General LMA anesthesia was introduced.  The patient was placed into a low lithotomy position.  Sterile field was created by prepping and draping the patient's penis, perineum, and proximal thighs using iodine  x3.  Next, cystourethroscopy was performed with a 22-French rigid cystoscope with 12-degree offset lens.  Inspection of the anterior and posterior urethra revealed trilobar prostatic hypertrophy, minimal intravesical component.  Ureteral orifices were well away from the prostatic adenoma.  There were no papillary lesions, calcifications, diverticula of the urinary bladder.  The cystoscope was then exchanged for a 26-French ACMI continuous flow resectoscope and using bipolar resection loop, systematic prostate resection was performed.  Initial resection was of the median lobe in a top down approach bringing the median lobe into apposition with the posterior bladder neck taking great care not to undermine the bladder neck and the lateral lobes were then addressed in a top-down fashion resecting from bladder neck towards the area of the verumontanum taking great care not to resect distal to this structure to avoid injury to the external urethral sphincter.  Resection was performed in this method, first on the right side and then on the left side, down to where it appeared to be the fibrous circular capsules of the prostate.  Prostatic chips were irrigated out using a Toomey syringe.  Repeat cystoscopic inspection revealed all prostate chips were removed.  No injury to the ureteral orifices.  No evidence of bladder perforation.  Additional tailoring of the prostatic fossa and hemostasis was achieved using a button electrode using combination of coagulation and vaporization current, this resulted in excellent hemostasis  and a well-formed smooth and prostatic fossa from the bladder neck towards the area of the verumontanum.  Next the resectoscope was exchanged for new 22-French 3-way Foley catheter with 30 mL of water in the balloon and connected normal saline irrigation 1 drop per second with the efflux being light pink.  Procedure was then terminated.  The patient tolerated the procedure well.   There were no immediate periprocedural complications.  The patient was taken to the postanesthesia care unit in a stable condition.          ______________________________ Sebastian Ache, MD     TM/MEDQ  D:  08/25/2012  T:  08/25/2012  Job:  [id number]

## 2012-08-26 LAB — HEMOGLOBIN AND HEMATOCRIT, BLOOD: HCT: 37.6 % — ABNORMAL LOW (ref 39.0–52.0)

## 2012-08-26 MED ORDER — OXYCODONE-ACETAMINOPHEN 5-325 MG PO TABS: 1.0000 | ORAL_TABLET | ORAL | Status: DC | PRN

## 2012-08-26 MED ORDER — SULFAMETHOXAZOLE-TRIMETHOPRIM 800-160 MG PO TABS: 1.0000 | ORAL_TABLET | Freq: Every day | ORAL | Status: DC

## 2012-08-26 NOTE — Care Management Note (Signed)
    Page 1 of 1   08/26/2012     1:38:04 PM   CARE MANAGEMENT NOTE 08/26/2012  Patient:  Brian Walsh, Brian Walsh   Account Number:  [id number]  Date Initiated:  08/26/2012  Documentation initiated by:  Lanier Clam  Subjective/Objective Assessment:   ADMITTED W/PROSTATIC OBSTRUCTION.     Action/Plan:   FROM HOME   Anticipated DC Date:  08/26/2012   Anticipated DC Plan:  HOME/SELF CARE      DC Planning Services  CM consult      Choice offered to / List presented to:             Status of service:  Completed, signed off Medicare Important Message given?   (If response is "NO", the following Medicare IM given date fields will be blank) Date Medicare IM given:   Date Additional Medicare IM given:    Discharge Disposition:  HOME/SELF CARE  Per UR Regulation:  Reviewed for med. necessity/level of care/duration of stay  If discussed at Long Length of Stay Meetings, dates discussed:    Comments:

## 2012-08-26 NOTE — Discharge Summary (Signed)
Physician Discharge Summary  Patient ID: Brian Walsh MRN: [id number] DOB/AGE: [birth year] 52 y.o.  Admit date: 08/25/2012 Discharge date: 08/26/2012  Admission Diagnoses: Medication - Refractory Prostatic Hypertrophy  Discharge Diagnoses: Medication - Refractory Prostatic Hypertrophy   Discharged Condition: good  Hospital Course:   Pt underwent transurethral resection of prostate on the day of admission without acute complications. He was admitted overnight for observation and bladder irrigation. By POD1, he was ambulatory, tolerating diet, pain controlled, urine clear of irrigation, Hgb stable, and felt to be adequate for discharge.   Consults: None  Significant Diagnostic Studies: labs: Hgb 12.4 at discharge  Treatments: surgery: transurethral resection of prostate 08/25/12  Discharge Exam: Blood pressure 119/64, pulse 70, temperature 97.6 F (36.4 C), temperature source Oral, resp. rate 16, height 5\' 10"  (1.778 m), weight 96.616 kg (213 lb), SpO2 96.00%. General appearance: alert, cooperative and appears stated age Head: Normocephalic, without obvious abnormality, atraumatic Eyes: conjunctivae/corneas clear. PERRL, EOM's intact. Fundi benign. Ears: normal TM's and external ear canals both ears Nose: Nares normal. Septum midline. Mucosa normal. No drainage or sinus tenderness. Throat: lips, mucosa, and tongue normal; teeth and gums normal Neck: no adenopathy, no carotid bruit, no JVD, supple, symmetrical, trachea midline and thyroid not enlarged, symmetric, no tenderness/mass/nodules Back: symmetric, no curvature. ROM normal. No CVA tenderness. Resp: clear to auscultation bilaterally Chest wall: no tenderness Cardio: regular rate and rhythm, S1, S2 normal, no murmur, click, rub or gallop GI: soft, non-tender; bowel sounds normal; no masses,  no organomegaly Male genitalia: normal, Foley in c/d/i with yellow to very light pink urine off irrigation. Extremities: extremities  normal, atraumatic, no cyanosis or edema Pulses: 2+ and symmetric Skin: Skin color, texture, turgor normal. No rashes or lesions Lymph nodes: Cervical, supraclavicular, and axillary nodes normal. Neurologic: Grossly normal  Disposition: Final discharge disposition not confirmed     Medication List    STOP taking these medications       Fish Oil 1000 MG Caps     multivitamin tablet     NON FORMULARY     silodosin 8 MG Caps capsule  Commonly known as:  RAPAFLO      TAKE these medications       albuterol 108 (90 BASE) MCG/ACT inhaler  Commonly known as:  PROVENTIL HFA;VENTOLIN HFA  Inhale 1 puff into the lungs every 6 (six) hours as needed. Wheezing and shortness of breath     Arginine 1000 MG Tabs  Take 1-2 tablets by mouth daily.     loratadine-pseudoephedrine 5-120 MG per tablet  Commonly known as:  CLARITIN-D 12-hour  Take 1 tablet by mouth 2 (two) times daily.     oxyCODONE-acetaminophen 5-325 MG per tablet  Commonly known as:  PERCOCET/ROXICET  Take 1-2 tablets by mouth every 4 (four) hours as needed.     psyllium 95 % Pack  Commonly known as:  HYDROCIL/METAMUCIL  Take 1 packet by mouth as needed. If senokot is not working     senna 8.6 MG tablet  Commonly known as:  SENOKOT  Take 2 tablets by mouth daily.     SINUS CONGESTION/PAIN DAYTIME PO  Take 1 tablet by mouth as needed.     sulfamethoxazole-trimethoprim 800-160 MG per tablet  Commonly known as:  BACTRIM DS  Take 1 tablet by mouth daily. X 7 days to prevent infection     vitamin C 500 MG tablet  Commonly known as:  ASCORBIC ACID  Take 500 mg by mouth daily.  Follow-up Information   Follow up with Sebastian Ache, MD On 08/31/2012. (9:30 AM for nurse visit and catheter removal)    Contact information:   [address] [place] [address] [phone]       Signed: Sebastian Ache 08/26/2012, 8:04 AM
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
## 2012-08-27 ENCOUNTER — Encounter (HOSPITAL_COMMUNITY): Payer: Self-pay | Admitting: Urology

## 2012-09-09 ENCOUNTER — Encounter (HOSPITAL_COMMUNITY): Payer: Self-pay

## 2012-09-09 ENCOUNTER — Emergency Department (HOSPITAL_COMMUNITY)
Admission: EM | Admit: 2012-09-09 | Discharge: 2012-09-09 | Disposition: A | Discharge status: Home / Self Care | Payer: BC Managed Care – PPO | Attending: Emergency Medicine | Admitting: Emergency Medicine

## 2012-09-09 DIAGNOSIS — G35 Multiple sclerosis: Secondary | ICD-10-CM | POA: Insufficient documentation

## 2012-09-09 DIAGNOSIS — Z85828 Personal history of other malignant neoplasm of skin: Secondary | ICD-10-CM | POA: Insufficient documentation

## 2012-09-09 DIAGNOSIS — Y838 Other surgical procedures as the cause of abnormal reaction of the patient, or of later complication, without mention of misadventure at the time of the procedure: Secondary | ICD-10-CM | POA: Insufficient documentation

## 2012-09-09 DIAGNOSIS — Z87448 Personal history of other diseases of urinary system: Secondary | ICD-10-CM | POA: Insufficient documentation

## 2012-09-09 DIAGNOSIS — G4733 Obstructive sleep apnea (adult) (pediatric): Secondary | ICD-10-CM | POA: Insufficient documentation

## 2012-09-09 DIAGNOSIS — Z79899 Other long term (current) drug therapy: Secondary | ICD-10-CM | POA: Insufficient documentation

## 2012-09-09 DIAGNOSIS — R319 Hematuria, unspecified: Secondary | ICD-10-CM

## 2012-09-09 DIAGNOSIS — Z860100 Personal history of colon polyps, unspecified: Secondary | ICD-10-CM | POA: Insufficient documentation

## 2012-09-09 DIAGNOSIS — Z8709 Personal history of other diseases of the respiratory system: Secondary | ICD-10-CM | POA: Insufficient documentation

## 2012-09-09 DIAGNOSIS — R109 Unspecified abdominal pain: Secondary | ICD-10-CM | POA: Insufficient documentation

## 2012-09-09 DIAGNOSIS — Z8711 Personal history of peptic ulcer disease: Secondary | ICD-10-CM | POA: Insufficient documentation

## 2012-09-09 DIAGNOSIS — N189 Chronic kidney disease, unspecified: Secondary | ICD-10-CM | POA: Insufficient documentation

## 2012-09-09 DIAGNOSIS — Z978 Presence of other specified devices: Secondary | ICD-10-CM

## 2012-09-09 DIAGNOSIS — E079 Disorder of thyroid, unspecified: Secondary | ICD-10-CM | POA: Insufficient documentation

## 2012-09-09 DIAGNOSIS — R339 Retention of urine, unspecified: Secondary | ICD-10-CM

## 2012-09-09 DIAGNOSIS — Z8719 Personal history of other diseases of the digestive system: Secondary | ICD-10-CM | POA: Insufficient documentation

## 2012-09-09 DIAGNOSIS — J45909 Unspecified asthma, uncomplicated: Secondary | ICD-10-CM | POA: Insufficient documentation

## 2012-09-09 LAB — URINALYSIS, ROUTINE W REFLEX MICROSCOPIC
Nitrite: NEGATIVE
Protein, ur: >300 mg/dL — AB
Urobilinogen, UA: 0.2 mg/dL (ref 0.0–1.0)

## 2012-09-09 LAB — COMPREHENSIVE METABOLIC PANEL
ALT: 23 U/L (ref 0–53)
AST: 16 U/L (ref 0–37)
CO2: 24 mEq/L (ref 19–32)
Calcium: 9.2 mg/dL (ref 8.4–10.5)
GFR calc non Af Amer: >90 mL/min (ref >90)
Sodium: 137 mEq/L (ref 135–145)

## 2012-09-09 LAB — CBC WITH DIFFERENTIAL/PLATELET
Basophils Absolute: 0 10*3/uL (ref 0.0–0.1)
Eosinophils Relative: 1 % (ref 0–5)
Lymphocytes Relative: 27 % (ref 12–46)
MCV: 90.2 fL (ref 78.0–100.0)
Neutro Abs: 4.6 10*3/uL (ref 1.7–7.7)
Neutrophils Relative %: 63 % (ref 43–77)
Platelets: 293 10*3/uL (ref 150–400)
RDW: 13.4 % (ref 11.5–15.5)
WBC: 7.3 10*3/uL (ref 4.0–10.5)

## 2012-09-09 MED ORDER — LIDOCAINE HCL 2 % EX GEL
Freq: Once | CUTANEOUS | Status: AC
  Administered 2012-09-09: 10 via URETHRAL
  Filled 2012-09-09: qty 10

## 2012-09-09 MED ORDER — HYDROMORPHONE HCL PF 2 MG/ML IJ SOLN
2.0000 mg | Freq: Once | INTRAMUSCULAR | Status: AC
  Administered 2012-09-09: 2 mg via INTRAMUSCULAR
  Filled 2012-09-09: qty 1

## 2012-09-09 NOTE — ED Notes (Signed)
Pt reports he is not ready to be discharged and that he wants to wait for his wife to bring him other clothes because he is not satisfied with the clothes he has.

## 2012-09-09 NOTE — ED Notes (Signed)
Pt dressed and wheelchair provided. Pt requesting leg bag be drained again and water provided upon pt request.

## 2012-09-09 NOTE — ED Provider Notes (Addendum)
History     CSN: [id number]  Arrival date & time 09/09/12  1031   First MD Initiated Contact with Patient 09/09/12 1111      Chief Complaint  Patient presents with  . post op problems     s/p TURP    (Consider location/radiation/quality/duration/timing/severity/associated sxs/prior treatment) HPI Comments: Brian Walsh is a 52 y.o. Male who presents for evaluation of a Foley catheter problem. He feels like urine is leaking around the catheter and the catheter is draining well. He had a prostate procedure, TURP about 10 days ago. He has recently become exercising more. He is mild suprapubic abdominal pain. He denies recent fever, chills, nausea or vomiting. There are no other known modifying factors.  The history is provided by the patient.    Past Medical History  Diagnosis Date  . Multiple sclerosis 1992    Remission since 1996 on no meds.  Initial presentation was gait/balance and leg weakness sx's.  . Allergic rhinitis     Much improved with allergy immunotherapy (molds primarily)  . Asthma     no inhaler requirement since allergies under control with immunotherapy  . PUD (peptic ulcer disease)     Bleeding ulcer 1978  . BPH (benign prostatic hypertrophy)     Rapaflo helps.  PSA's have been normal.  Cysto 06/2012 confirmed trilobed BPH--plan made for TURP procedure  . OSA on CPAP 2005  . Chronic constipation   . Personal history of colonic polyps 03/24/2012    03/2012 - 5 mm cecal adenoma  . Thyroid nodule 05/2012    Right lobe 25mmX14mm nodule found on screening thyroid and carotid u/s done through his employer.  Pt was euthyroid at that time.  A radioactive iodine uptake and scan was normal 06/2012.  Marland Kitchen Chronic kidney disease     urinary frequency  . Cancer     skin cancer     Past Surgical History  Procedure Laterality Date  . Femur fracture surgery  1978    with rod insertion.  Rod removal 1979  . Appendectomy  1986  . Colonoscopy  2006, 2013     diminutive  hyperplastic cecal polyp ([place]); diminutive cecal adenoma removed 2013, recall 2018  . Transurethral resection of prostate N/A 08/25/2012    Procedure: TRANSURETHRAL RESECTION OF THE PROSTATE WITH GYRUS INSTRUMENTS;  Surgeon: Sebastian Ache, MD;  Location: [facility];  Service: Urology;  Laterality: N/A;    Family History  Problem Relation Age of Onset  . Breast cancer Mother   . Parkinsonism Father   . Stroke Father   . Colon polyps Father 58    History  Substance Use Topics  . Smoking status: Never Smoker   . Smokeless tobacco: Never Used  . Alcohol Use: 0.0 oz/week     Comment: occasional wine       Review of Systems  All other systems reviewed and are negative.    Allergies  Review of patient's allergies indicates no known allergies.  Home Medications   Current Outpatient Rx  Name  Route  Sig  Dispense  Refill  . albuterol (PROVENTIL HFA;VENTOLIN HFA) 108 (90 BASE) MCG/ACT inhaler   Inhalation   Inhale 2 puffs into the lungs every 6 (six) hours as needed for shortness of breath.          . Arginine 1000 MG TABS   Oral   Take 1-2 tablets by mouth daily.          Marland Kitchen loratadine-pseudoephedrine (CLARITIN-D 12-HOUR) 5-120  MG per tablet   Oral   Take 1 tablet by mouth 2 (two) times daily.         Marland Kitchen oxyCODONE-acetaminophen (PERCOCET/ROXICET) 5-325 MG per tablet   Oral   Take 1-2 tablets by mouth every 4 (four) hours as needed for pain.         Marland Kitchen Phenylephrine-Acetaminophen (SINUS CONGESTION/PAIN DAYTIME PO)   Oral   Take 1 tablet by mouth daily as needed (for allergies.).          Marland Kitchen psyllium (HYDROCIL/METAMUCIL) 95 % PACK   Oral   Take 1 packet by mouth daily as needed (if no relief from Senokot.).          Marland Kitchen senna (SENOKOT) 8.6 MG tablet   Oral   Take 2 tablets by mouth daily.          . vitamin C (ASCORBIC ACID) 500 MG tablet   Oral   Take 500 mg by mouth daily.          Marland Kitchen sulfamethoxazole-trimethoprim (BACTRIM DS) 800-160 MG per tablet    Oral   Take 1 tablet by mouth daily. X 7 days to prevent infection   7 tablet   0     BP 138/91  Pulse 76  Temp(Src) 97.9 F (36.6 C) (Oral)  Resp 16  SpO2 98%  Physical Exam  Nursing note and vitals reviewed. Constitutional: He is oriented to person, place, and time. He appears well-developed and well-nourished.  HENT:  Head: Normocephalic and atraumatic.  Right Ear: External ear normal.  Left Ear: External ear normal.  Eyes: Conjunctivae and EOM are normal. Pupils are equal, round, and reactive to light.  Neck: Normal range of motion and phonation normal. Neck supple.  Cardiovascular: Normal rate, regular rhythm, normal heart sounds and intact distal pulses.   Pulmonary/Chest: Effort normal and breath sounds normal. He exhibits no bony tenderness.  Abdominal: Soft. Normal appearance. There is no tenderness.  Genitourinary:  Normal penis and scrotum. Catheter through the urethral meatus; appears normal. There is no drainage of fluid or blood around the catheter.  Musculoskeletal: Normal range of motion.  Neurological: He is alert and oriented to person, place, and time. He has normal strength. No cranial nerve deficit or sensory deficit. He exhibits normal muscle tone. Coordination normal.  Skin: Skin is warm, dry and intact.  Psychiatric: His behavior is normal. Judgment and thought content normal.  Anxious    ED Course  Procedures (including critical care time)   Sequential Foley catheters placed to drain and irrigate the bladder. Success achieved, the 22-gauge Foley.  He was evaluated in ED by Dr. Brunilda Payor, from urology. Dr. to, see. We'll call in a prescription for antibiotics for possible UTI. He recommended leaving the catheter in and followup with his urologist, on 09/13/12.     Labs Reviewed  URINALYSIS, ROUTINE W REFLEX MICROSCOPIC - Abnormal; Notable for the following:    Color, Urine RED (*)    APPearance TURBID (*)    Specific Gravity, Urine 1.039 (*)     Glucose, UA 100 (*)    Hgb urine dipstick LARGE (*)    Protein, ur >300 (*)    Leukocytes, UA SMALL (*)    All other components within normal limits  COMPREHENSIVE METABOLIC PANEL - Abnormal; Notable for the following:    Glucose, Bld 118 (*)    Total Bilirubin 0.2 (*)    All other components within normal limits  CBC WITH DIFFERENTIAL  URINE MICROSCOPIC-ADD ON  URINALYSIS, MICROSCOPIC ONLY   Nursing notes, applicable records and vitals reviewed.  Radiologic Images/Reports reviewed.   1. Hematuria   2. Urinary retention   3. Foley catheter in place       MDM  Urinary retention secondary to hematuria. Improved, with treatment. Stable for discharge with catheter in place. Doubt metabolic instability, serious bacterial infection or impending vascular collapse; the patient is stable for discharge.   Plan: Home Medications- usual; Home Treatments- drink plenty of fluid; Recommended follow up- urology       Flint Melter, MD 09/09/12 1658  Flint Melter, MD 09/26/12 [phone]

## 2012-09-09 NOTE — ED Notes (Addendum)
Dr Julien Girt called back to check on pt, pt given a plug for 3 way catheter. Charge nurse in room talking with pt. rn offered pt to called to dr Julien Girt. Pt talked to dr Julien Girt. Pt aware he is ready to be discharged home.  When pt came into ED mitchell, rn discussed with pt and Brian Tritz, rn witnessed that mitchell, rn reported that in ED we do not place 3 way catheters or d/c pts home with 3 way catheters.

## 2012-09-09 NOTE — ED Notes (Addendum)
Pt raising voice at rn, rn alerted pt that md said pt needed to talk to dr Julien Girt. Pt irritated, pt wife will call office number. Rn said she would try to contact dr Julien Girt. rn paged dr Julien Girt, dr Julien Girt spoke with pt on the phone. Pt will get leg bag to go home with, pt will go home with catheter in place. Dr Julien Girt told pt that is was normal for some urine to leak from around catheter due to bladder spasms.

## 2012-09-09 NOTE — ED Notes (Signed)
Upon further assessment, pt reports that pt has voided 3 times only blood. Pt denies pain.   Pt alert and oriented x4. Respirations even and unlabored, bilateral symmetrical rise and fall of chest. Skin warm and dry. In no acute distress. Denies needs.

## 2012-09-09 NOTE — ED Notes (Signed)
rn in pt room over 30 min irrigating foley catheter. Fluid now light pink from previous dark red color. saline irrigated

## 2012-09-09 NOTE — Consult Note (Signed)
Urology Consult  Referring physician: Dr Mancel Bale Reason for referral: Hematuria  Chief Complaint: Gross hematuria  History of Present Illness: Brian Walsh had TURP on 2/12 by Dr Berneice Heinrich.  He was voiding well until this morning when he started having gross hematuria associated with suprapubic pain.  He went to the ER. A small Foley catheter was inserted in the bladder and did not drain well.  The catheter was eventually replaced with a # 22Fr catheter.  When I saw him I irrigated the catheter with normal saline.  There were no clots and the urine was slightly bloody.  I explained to himthat since the catheter is draining well he could go home with the catheter and follow-up with Dr Berneice Heinrich.  However if he felt uncomfortable going home I could keep him in the hospital.  He agreed to go home and follow with Dr Berneice Heinrich.  He will call back if he has any problems with the catheter.  Past Medical History  Diagnosis Date  . Multiple sclerosis 1992    Remission since 1996 on no meds.  Initial presentation was gait/balance and leg weakness sx's.  . Allergic rhinitis     Much improved with allergy immunotherapy (molds primarily)  . Asthma     no inhaler requirement since allergies under control with immunotherapy  . PUD (peptic ulcer disease)     Bleeding ulcer 1978  . BPH (benign prostatic hypertrophy)     Rapaflo helps.  PSA's have been normal.  Cysto 06/2012 confirmed trilobed BPH--plan made for TURP procedure  . OSA on CPAP 2005  . Chronic constipation   . Personal history of colonic polyps 03/24/2012    03/2012 - 5 mm cecal adenoma  . Thyroid nodule 05/2012    Right lobe 45mmX14mm nodule found on screening thyroid and carotid u/s done through his employer.  Pt was euthyroid at that time.  A radioactive iodine uptake and scan was normal 06/2012.  Marland Kitchen Chronic kidney disease     urinary frequency  . Cancer     skin cancer    Past Surgical History  Procedure Laterality Date  . Femur fracture  surgery  1978    with rod insertion.  Rod removal 1979  . Appendectomy  1986  . Colonoscopy  2006, 2013     diminutive hyperplastic cecal polyp ([place]); diminutive cecal adenoma removed 2013, recall 2018  . Transurethral resection of prostate N/A 08/25/2012    Procedure: TRANSURETHRAL RESECTION OF THE PROSTATE WITH GYRUS INSTRUMENTS;  Surgeon: Brian Ache, MD;  Location: [facility];  Service: Urology;  Laterality: N/A;     Allergies: No Known Allergies  Family History  Problem Relation Age of Onset  . Breast cancer Mother   . Parkinsonism Father   . Stroke Father   . Colon polyps Father 62   Social History:  reports that he has never smoked. He has never used smokeless tobacco. He reports that  drinks alcohol. He reports that he does not use illicit drugs.  ROS: All systems are reviewed and negative except as noted.   Physical Exam:  Vital signs in last 24 hours: Temp:  [97.9 F (36.6 C)-98.7 F (37.1 C)] 98.7 F (37.1 C) (02/27 1756) Pulse Rate:  [76-92] 77 (02/27 1756) Resp:  [16-20] 20 (02/27 1756) BP: (133-169)/(83-94) 169/94 mmHg (02/27 1756) SpO2:  [96 %-98 %] 96 % (02/27 1756)  Cardiovascular: Skin warm; not flushed Respiratory: Breaths quiet; no shortness of breath Abdomen: No masses Neurological: Normal sensation to  touch Musculoskeletal: Normal motor function arms and legs Lymphatics: No inguinal adenopathy Skin: No rashes Genitourinary:Penis and scrotal contents: WNL  Laboratory Data:  Results for orders placed during the hospital encounter of 09/09/12 (from the past 72 hour(s))  URINALYSIS, ROUTINE W REFLEX MICROSCOPIC     Status: Abnormal   Collection Time    09/09/12 10:54 AM      Result Value Range   Color, Urine RED (*) YELLOW   Comment: BIOCHEMICALS MAY BE AFFECTED BY COLOR   APPearance TURBID (*) CLEAR   Specific Gravity, Urine 1.039 (*) 1.005 - 1.030   pH 7.0  5.0 - 8.0   Glucose, UA 100 (*) NEGATIVE mg/dL   Hgb urine dipstick LARGE (*)  NEGATIVE   Bilirubin Urine NEGATIVE  NEGATIVE   Ketones, ur NEGATIVE  NEGATIVE mg/dL   Protein, ur >161 (*) NEGATIVE mg/dL   Urobilinogen, UA 0.2  0.0 - 1.0 mg/dL   Nitrite NEGATIVE  NEGATIVE   Leukocytes, UA SMALL (*) NEGATIVE  URINE MICROSCOPIC-ADD ON     Status: None   Collection Time    09/09/12 10:54 AM      Result Value Range   RBC / HPF TOO NUMEROUS TO COUNT  <3 RBC/hpf   Urine-Other LESS THAN 10 mL OF URINE SUBMITTED     Comment: MICROSCOPIC EXAM PERFORMED ON UNCONCENTRATED URINE     URINALYSIS PERFORMED ON SUPERNATANT     FIELD OBSCURED BY RBC'S  CBC WITH DIFFERENTIAL     Status: None   Collection Time    09/09/12 11:00 AM      Result Value Range   WBC 7.3  4.0 - 10.5 K/uL   RBC 4.41  4.22 - 5.81 MIL/uL   Hemoglobin 13.4  13.0 - 17.0 g/dL   HCT 09.6  04.5 - 40.9 %   MCV 90.2  78.0 - 100.0 fL   MCH 30.4  26.0 - 34.0 pg   MCHC 33.7  30.0 - 36.0 g/dL   RDW 81.1  91.4 - 78.2 %   Platelets 293  150 - 400 K/uL   Neutrophils Relative 63  43 - 77 %   Neutro Abs 4.6  1.7 - 7.7 K/uL   Lymphocytes Relative 27  12 - 46 %   Lymphs Abs 2.0  0.7 - 4.0 K/uL   Monocytes Relative 8  3 - 12 %   Monocytes Absolute 0.6  0.1 - 1.0 K/uL   Eosinophils Relative 1  0 - 5 %   Eosinophils Absolute 0.1  0.0 - 0.7 K/uL   Basophils Relative 0  0 - 1 %   Basophils Absolute 0.0  0.0 - 0.1 K/uL  COMPREHENSIVE METABOLIC PANEL     Status: Abnormal   Collection Time    09/09/12 11:00 AM      Result Value Range   Sodium 137  135 - 145 mEq/L   Potassium 4.2  3.5 - 5.1 mEq/L   Chloride 103  96 - 112 mEq/L   CO2 24  19 - 32 mEq/L   Glucose, Bld 118 (*) 70 - 99 mg/dL   BUN 21  6 - 23 mg/dL   Creatinine, Ser 9.56  0.50 - 1.35 mg/dL   Calcium 9.2  8.4 - 21.3 mg/dL   Total Protein 7.0  6.0 - 8.3 g/dL   Albumin 3.5  3.5 - 5.2 g/dL   AST 16  0 - 37 U/L   ALT 23  0 - 53 U/L   Alkaline  Phosphatase 71  39 - 117 U/L   Total Bilirubin 0.2 (*) 0.3 - 1.2 mg/dL   GFR calc non Af Amer >90  >90 mL/min    GFR calc Af Amer >90  >90 mL/min   Comment:            The eGFR has been calculated     using the CKD EPI equation.     This calculation has not been     validated in all clinical     situations.     eGFR's persistently     <90 mL/min signify     possible Chronic Kidney Disease.   No results found for this or any previous visit (from the past 240 hour(s)). Creatinine:  Recent Labs  09/09/12 1100  CREATININE 0.98   Impression/Assessment:  Gross hematuria.  S/P TURP.  Plan:  Leave Foley indwelling.   Cipro 250 mgm BID.   Follow-up with Dr Berneice Heinrich next week.  Lindsy Cerullo-HENRY 09/09/2012, 7:51 PM

## 2012-09-09 NOTE — ED Notes (Addendum)
Pt reports that fluid is leaking around the catheter. md made aware. md reports that pt needs to talk to dr Julien Girt.

## 2012-09-09 NOTE — ED Notes (Signed)
14 F catheter inserted, irrigation began. Blood/ fluid leaking from around catheter. Pt moaning in pain. Urology called. Catheter still in place. Pain medication given. Will wait for urology.

## 2012-09-09 NOTE — ED Notes (Addendum)
Urologist at bedside. Urinalysis already performed, dr Julien Girt reports he does not need another urinalysis

## 2012-09-09 NOTE — ED Notes (Signed)
rn checked with pt again and pt requests 22 [place] be placed now.

## 2012-09-09 NOTE — ED Notes (Signed)
Patient had a TURP on 08/25/12 and began doing light weight exercising 3 days ago. Patient rode the bicycle in the gym today and noted bright red bleeding when he urinated. Patient has had a normal stool this AM and denies any pain.

## 2012-09-09 NOTE — ED Notes (Addendum)
rn reviewed discharged instructions with pt thoroughly. leg bag attached and emptied by rn.

## 2012-09-09 NOTE — ED Notes (Signed)
Pt asked for velcro strap along with leg bag and regular catheter bag. Pt made aware we do not have any velcro straps, pt given adhesive strap holder. Pt requested more than 1 strap. Pt given 1 extra strap.

## 2012-09-09 NOTE — ED Notes (Signed)
Pt reported to md that he wants the 22 french catheter placed now. Urology will not be present till 5pm. Pt demanding 56 French be removed and 22 [place] placed now.

## 2012-09-09 NOTE — ED Notes (Signed)
Pt complaining of discomfort and feels like he needs to void, but unable to at this time; bladder scanned patient with 180cc residual in bladder; spoke with MD who is consulting urology at this time; will follow up once urology is involved

## 2012-09-11 ENCOUNTER — Observation Stay (HOSPITAL_COMMUNITY)
Admission: EM | Admit: 2012-09-11 | Discharge: 2012-09-12 | Disposition: A | Discharge status: Home / Self Care | Payer: BC Managed Care – PPO | Attending: Urology | Admitting: Urology

## 2012-09-11 ENCOUNTER — Encounter (HOSPITAL_COMMUNITY): Payer: Self-pay | Admitting: *Deleted

## 2012-09-11 ENCOUNTER — Emergency Department (HOSPITAL_COMMUNITY)
Admission: EM | Admit: 2012-09-11 | Discharge: 2012-09-11 | Disposition: A | Discharge status: Home / Self Care | Payer: BC Managed Care – PPO | Attending: Emergency Medicine | Admitting: Emergency Medicine

## 2012-09-11 ENCOUNTER — Encounter (HOSPITAL_COMMUNITY): Payer: Self-pay | Admitting: Emergency Medicine

## 2012-09-11 DIAGNOSIS — Z860100 Personal history of colon polyps, unspecified: Secondary | ICD-10-CM | POA: Insufficient documentation

## 2012-09-11 DIAGNOSIS — G35 Multiple sclerosis: Secondary | ICD-10-CM | POA: Insufficient documentation

## 2012-09-11 DIAGNOSIS — R6883 Chills (without fever): Secondary | ICD-10-CM | POA: Insufficient documentation

## 2012-09-11 DIAGNOSIS — G4733 Obstructive sleep apnea (adult) (pediatric): Secondary | ICD-10-CM | POA: Insufficient documentation

## 2012-09-11 DIAGNOSIS — Z466 Encounter for fitting and adjustment of urinary device: Secondary | ICD-10-CM | POA: Insufficient documentation

## 2012-09-11 DIAGNOSIS — Z9981 Dependence on supplemental oxygen: Secondary | ICD-10-CM | POA: Insufficient documentation

## 2012-09-11 DIAGNOSIS — Z8711 Personal history of peptic ulcer disease: Secondary | ICD-10-CM | POA: Insufficient documentation

## 2012-09-11 DIAGNOSIS — Z87448 Personal history of other diseases of urinary system: Secondary | ICD-10-CM | POA: Insufficient documentation

## 2012-09-11 DIAGNOSIS — R339 Retention of urine, unspecified: Secondary | ICD-10-CM

## 2012-09-11 DIAGNOSIS — R34 Anuria and oliguria: Secondary | ICD-10-CM | POA: Insufficient documentation

## 2012-09-11 DIAGNOSIS — T839XXA Unspecified complication of genitourinary prosthetic device, implant and graft, initial encounter: Secondary | ICD-10-CM

## 2012-09-11 DIAGNOSIS — Z9079 Acquired absence of other genital organ(s): Secondary | ICD-10-CM | POA: Insufficient documentation

## 2012-09-11 DIAGNOSIS — J45909 Unspecified asthma, uncomplicated: Secondary | ICD-10-CM | POA: Insufficient documentation

## 2012-09-11 DIAGNOSIS — R319 Hematuria, unspecified: Secondary | ICD-10-CM

## 2012-09-11 DIAGNOSIS — N189 Chronic kidney disease, unspecified: Secondary | ICD-10-CM | POA: Insufficient documentation

## 2012-09-11 DIAGNOSIS — R310 Gross hematuria: Principal | ICD-10-CM | POA: Insufficient documentation

## 2012-09-11 DIAGNOSIS — Z85828 Personal history of other malignant neoplasm of skin: Secondary | ICD-10-CM | POA: Insufficient documentation

## 2012-09-11 DIAGNOSIS — Z79899 Other long term (current) drug therapy: Secondary | ICD-10-CM | POA: Insufficient documentation

## 2012-09-11 LAB — URINALYSIS, ROUTINE W REFLEX MICROSCOPIC
Bilirubin Urine: NEGATIVE
Nitrite: NEGATIVE
Protein, ur: 100 mg/dL — AB
Specific Gravity, Urine: 1.008 (ref 1.005–1.030)
Urobilinogen, UA: 0.2 mg/dL (ref 0.0–1.0)

## 2012-09-11 LAB — COMPREHENSIVE METABOLIC PANEL
CO2: 25 mEq/L (ref 19–32)
Calcium: 9 mg/dL (ref 8.4–10.5)
Creatinine, Ser: 0.93 mg/dL (ref 0.50–1.35)
GFR calc Af Amer: >90 mL/min (ref >90)
GFR calc non Af Amer: >90 mL/min (ref >90)
Glucose, Bld: 113 mg/dL — ABNORMAL HIGH (ref 70–99)

## 2012-09-11 LAB — URINE MICROSCOPIC-ADD ON

## 2012-09-11 LAB — CBC WITH DIFFERENTIAL/PLATELET
Eosinophils Relative: 3 % (ref 0–5)
HCT: 35.9 % — ABNORMAL LOW (ref 39.0–52.0)
Lymphocytes Relative: 18 % (ref 12–46)
Lymphs Abs: 1.9 10*3/uL (ref 0.7–4.0)
MCV: 90 fL (ref 78.0–100.0)
Monocytes Absolute: 0.9 10*3/uL (ref 0.1–1.0)
Monocytes Relative: 9 % (ref 3–12)
RBC: 3.99 MIL/uL — ABNORMAL LOW (ref 4.22–5.81)
RDW: 13.3 % (ref 11.5–15.5)
WBC: 10.8 10*3/uL — ABNORMAL HIGH (ref 4.0–10.5)

## 2012-09-11 MED ORDER — MORPHINE SULFATE 4 MG/ML IJ SOLN
4.0000 mg | Freq: Once | INTRAMUSCULAR | Status: AC
  Administered 2012-09-11: 4 mg via INTRAVENOUS
  Filled 2012-09-11: qty 1

## 2012-09-11 MED ORDER — ONDANSETRON HCL 4 MG/2ML IJ SOLN
4.0000 mg | Freq: Once | INTRAMUSCULAR | Status: AC
  Administered 2012-09-11: 4 mg via INTRAVENOUS
  Filled 2012-09-11: qty 2

## 2012-09-11 NOTE — Consult Note (Signed)
Urology Consult   Physician requesting consult: Dr. Loren Racer  Reason for consult: Hematuria and clot retention  History of Present Illness: Brian Walsh is a 52 y.o. who underwent a TURP by Dr. Berneice Heinrich about 2 weeks ago.  He developed hematuria earlier this week and required catheter placement.  He developed clot retention on Thursday and was seen by the ED and Dr. Brunilda Payor.  The ED placed a 22 Fr 3 way catheter and when Dr. Brunilda Payor saw him, his catheter was draining well.  He then developed further hematuria and clot retention intermittently until early Saturday morning when his catheter stopped draining and he came to the ED again.  His catheter started draining again when he got here with grossly bloody urine.  Past Medical History  Diagnosis Date  . Multiple sclerosis 1992    Remission since 1996 on no meds.  Initial presentation was gait/balance and leg weakness sx's.  . Allergic rhinitis     Much improved with allergy immunotherapy (molds primarily)  . Asthma     no inhaler requirement since allergies under control with immunotherapy  . PUD (peptic ulcer disease)     Bleeding ulcer 1978  . BPH (benign prostatic hypertrophy)     Rapaflo helps.  PSA's have been normal.  Cysto 06/2012 confirmed trilobed BPH--plan made for TURP procedure  . OSA on CPAP 2005  . Chronic constipation   . Personal history of colonic polyps 03/24/2012    03/2012 - 5 mm cecal adenoma  . Thyroid nodule 05/2012    Right lobe 37mmX14mm nodule found on screening thyroid and carotid u/s done through his employer.  Pt was euthyroid at that time.  A radioactive iodine uptake and scan was normal 06/2012.  Marland Kitchen Chronic kidney disease     urinary frequency  . Cancer     skin cancer     Past Surgical History  Procedure Laterality Date  . Femur fracture surgery  1978    with rod insertion.  Rod removal 1979  . Appendectomy  1986  . Colonoscopy  2006, 2013     diminutive hyperplastic cecal polyp ([place]); diminutive  cecal adenoma removed 2013, recall 2018  . Transurethral resection of prostate N/A 08/25/2012    Procedure: TRANSURETHRAL RESECTION OF THE PROSTATE WITH GYRUS INSTRUMENTS;  Surgeon: Sebastian Ache, MD;  Location: [facility];  Service: Urology;  Laterality: N/A;    Current Hospital Medications:   Medication List    ASK your doctor about these medications       albuterol 108 (90 BASE) MCG/ACT inhaler  Commonly known as:  PROVENTIL HFA;VENTOLIN HFA  Inhale 2 puffs into the lungs every 6 (six) hours as needed for shortness of breath.     Arginine 1000 MG Tabs  Take 1-2 tablets by mouth daily.     loratadine-pseudoephedrine 5-120 MG per tablet  Commonly known as:  CLARITIN-D 12-hour  Take 1 tablet by mouth 2 (two) times daily.     oxyCODONE-acetaminophen 5-325 MG per tablet  Commonly known as:  PERCOCET/ROXICET  Take 1-2 tablets by mouth every 4 (four) hours as needed for pain.     psyllium 95 % Pack  Commonly known as:  HYDROCIL/METAMUCIL  Take 1 packet by mouth daily as needed (if no relief from Senokot.).     senna 8.6 MG tablet  Commonly known as:  SENOKOT  Take 2 tablets by mouth daily.     SINUS CONGESTION/PAIN DAYTIME PO  Take 1 tablet by mouth daily as needed (  for allergies.).     vitamin C 500 MG tablet  Commonly known as:  ASCORBIC ACID  Take 500 mg by mouth daily.       Home Meds: @Hmed @  Scheduled Meds: Continuous Infusions: PRN Meds:.    Allergies: No Known Allergies  Family History  Problem Relation Age of Onset  . Breast cancer Mother   . Parkinsonism Father   . Stroke Father   . Colon polyps Father 104    Social History:  reports that he has never smoked. He has never used smokeless tobacco. He reports that  drinks alcohol. He reports that he does not use illicit drugs.  ROS: A complete review of systems was performed.  All systems are negative except for pertinent findings as noted.  Physical Exam:  Vital signs in last 24 hours: Temp:  [98.5 F  (36.9 C)] 98.5 F (36.9 C) (03/01 0106) Pulse Rate:  [78-84] 84 (03/01 0341) Resp:  [17] 17 (03/01 0341) BP: (142)/(78) 142/78 mmHg (03/01 0106) SpO2:  [95 %-97 %] 95 % (03/01 0341) General:  Alert and oriented, No acute distress HEENT: Normocephalic, atraumatic Neck: No JVD or lymphadenopathy Abdomen: Soft, nontender, nondistended, no abdominal masses GU: Grossly bloody urine in catheter  Laboratory Data:   Recent Labs  09/09/12 1100 09/11/12 0205  WBC 7.3 10.8*  HGB 13.4 12.1*  HCT 39.8 35.9*  PLT 293 270     Recent Labs  09/09/12 1100 09/11/12 0205  NA 137 138  K 4.2 4.1  CL 103 104  GLUCOSE 118* 113*  BUN 21 16  CALCIUM 9.2 9.0  CREATININE 0.98 0.93     Results for orders placed during the hospital encounter of 09/11/12 (from the past 24 hour(s))  CBC WITH DIFFERENTIAL     Status: Abnormal   Collection Time    09/11/12  2:05 AM      Result Value Range   WBC 10.8 (*) 4.0 - 10.5 K/uL   RBC 3.99 (*) 4.22 - 5.81 MIL/uL   Hemoglobin 12.1 (*) 13.0 - 17.0 g/dL   HCT 40.9 (*) 81.1 - 91.4 %   MCV 90.0  78.0 - 100.0 fL   MCH 30.3  26.0 - 34.0 pg   MCHC 33.7  30.0 - 36.0 g/dL   RDW 78.2  95.6 - 21.3 %   Platelets 270  150 - 400 K/uL   Neutrophils Relative 71  43 - 77 %   Neutro Abs 7.7  1.7 - 7.7 K/uL   Lymphocytes Relative 18  12 - 46 %   Lymphs Abs 1.9  0.7 - 4.0 K/uL   Monocytes Relative 9  3 - 12 %   Monocytes Absolute 0.9  0.1 - 1.0 K/uL   Eosinophils Relative 3  0 - 5 %   Eosinophils Absolute 0.3  0.0 - 0.7 K/uL   Basophils Relative 0  0 - 1 %   Basophils Absolute 0.0  0.0 - 0.1 K/uL  COMPREHENSIVE METABOLIC PANEL     Status: Abnormal   Collection Time    09/11/12  2:05 AM      Result Value Range   Sodium 138  135 - 145 mEq/L   Potassium 4.1  3.5 - 5.1 mEq/L   Chloride 104  96 - 112 mEq/L   CO2 25  19 - 32 mEq/L   Glucose, Bld 113 (*) 70 - 99 mg/dL   BUN 16  6 - 23 mg/dL   Creatinine, Ser 0.86  0.50 -  1.35 mg/dL   Calcium 9.0  8.4 - 16.1 mg/dL    Total Protein 6.8  6.0 - 8.3 g/dL   Albumin 3.3 (*) 3.5 - 5.2 g/dL   AST 22  0 - 37 U/L   ALT 17  0 - 53 U/L   Alkaline Phosphatase 68  39 - 117 U/L   Total Bilirubin 0.2 (*) 0.3 - 1.2 mg/dL   GFR calc non Af Amer >90  >90 mL/min   GFR calc Af Amer >90  >90 mL/min  URINALYSIS, ROUTINE W REFLEX MICROSCOPIC     Status: Abnormal   Collection Time    09/11/12  2:17 AM      Result Value Range   Color, Urine RED (*) YELLOW   APPearance CLEAR  CLEAR   Specific Gravity, Urine 1.008  1.005 - 1.030   pH 6.0  5.0 - 8.0   Glucose, UA NEGATIVE  NEGATIVE mg/dL   Hgb urine dipstick LARGE (*) NEGATIVE   Bilirubin Urine NEGATIVE  NEGATIVE   Ketones, ur NEGATIVE  NEGATIVE mg/dL   Protein, ur 096 (*) NEGATIVE mg/dL   Urobilinogen, UA 0.2  0.0 - 1.0 mg/dL   Nitrite NEGATIVE  NEGATIVE   Leukocytes, UA TRACE (*) NEGATIVE  URINE MICROSCOPIC-ADD ON     Status: None   Collection Time    09/11/12  2:17 AM      Result Value Range   RBC / HPF 0-2  <3 RBC/hpf   No results found for this or any previous visit (from the past 240 hour(s)).  Renal Function:  Recent Labs  09/09/12 1100 09/11/12 0205  CREATININE 0.98 0.93   The CrCl is unknown because both a height and weight (above a minimum accepted value) are required for this calculation.  Procedure:  I irrigated his catheter with sterile saline and removed multiple small blood clots.  His catheter was mostly clear and irrigated quantitatively and was left in place.  Impression/Assessment:  Clot retention  Plan:  D/C home with catheter and followup with Dr. Berneice Heinrich Monday as scheduled.  Carlene Bickley,LES 09/11/2012, 6:19 AM    Moody Bruins MD   CC: Dr. Ranae Palms

## 2012-09-11 NOTE — ED Notes (Signed)
Pt complain of intermittent  Spasms.

## 2012-09-11 NOTE — ED Provider Notes (Signed)
History  This chart was scribed for Loren Racer, MD by Bennett Scrape, ED Scribe. This patient was seen in room WA07/WA07 and the patient's care was started at 1:07 AM.  CSN: [id number]  Arrival date & time 09/11/12  0059   First MD Initiated Contact with Patient 09/11/12 0107      Chief Complaint  Patient presents with  . Hematuria    The history is provided by the patient. No language interpreter was used.    Brian Walsh is a 52 y.o. male who presents to the Emergency Department complaining of gradual onset, gradually worsening, constant hematuria described dime sized clots with associated chills, intermittent urinary retention and occasional bladder "spasms". He reports that the most recent clot he passed was upon arrival to the ED after having 3 hours of urinary retention. He had a transurethral resection of the prostate on 08/25/12 performed by Dr. Berneice Heinrich and had the foley catheter placed on 09/09/12 in the ED by Dr. Julien Girt. He reports having hematuria since the catheter was inserted. There is red urine in his foley currently which he states is lighter than what it has been today. He reports that he has been taking oxycodone at night and tylenol during the day for pain with improvement. He states that he has a follow up appointment with Dr. Julien Girt, his regular urologist, in 2 days. He reports that he was was started on Cipro yesterday and has taken his first three doses since. He denies fevers, nausea and emesis as associated symptoms. He has a h/o asthma and CKD and is an occasional alcohol user but denies smoking.  Past Medical History  Diagnosis Date  . Multiple sclerosis 1992    Remission since 1996 on no meds.  Initial presentation was gait/balance and leg weakness sx's.  . Allergic rhinitis     Much improved with allergy immunotherapy (molds primarily)  . Asthma     no inhaler requirement since allergies under control with immunotherapy  . PUD (peptic ulcer disease)    Bleeding ulcer 1978  . BPH (benign prostatic hypertrophy)     Rapaflo helps.  PSA's have been normal.  Cysto 06/2012 confirmed trilobed BPH--plan made for TURP procedure  . OSA on CPAP 2005  . Chronic constipation   . Personal history of colonic polyps 03/24/2012    03/2012 - 5 mm cecal adenoma  . Thyroid nodule 05/2012    Right lobe 67mmX14mm nodule found on screening thyroid and carotid u/s done through his employer.  Pt was euthyroid at that time.  A radioactive iodine uptake and scan was normal 06/2012.  Marland Kitchen Chronic kidney disease     urinary frequency  . Cancer     skin cancer     Past Surgical History  Procedure Laterality Date  . Femur fracture surgery  1978    with rod insertion.  Rod removal 1979  . Appendectomy  1986  . Colonoscopy  2006, 2013     diminutive hyperplastic cecal polyp ([place]); diminutive cecal adenoma removed 2013, recall 2018  . Transurethral resection of prostate N/A 08/25/2012    Procedure: TRANSURETHRAL RESECTION OF THE PROSTATE WITH GYRUS INSTRUMENTS;  Surgeon: Sebastian Ache, MD;  Location: [facility];  Service: Urology;  Laterality: N/A;    Family History  Problem Relation Age of Onset  . Breast cancer Mother   . Parkinsonism Father   . Stroke Father   . Colon polyps Father 49    History  Substance Use Topics  . Smoking status:  Never Smoker   . Smokeless tobacco: Never Used  . Alcohol Use: 0.0 oz/week     Comment: occasional wine       Review of Systems  Constitutional: Positive for chills. Negative for fever.  Gastrointestinal: Negative for nausea and vomiting.  Genitourinary: Positive for hematuria and decreased urine volume. Negative for penile pain and testicular pain.  All other systems reviewed and are negative.    Allergies  Review of patient's allergies indicates no known allergies.  Home Medications   No current outpatient prescriptions on file.  Triage Vitals: BP 142/78  Pulse 78  Temp(Src) 98.5 F (36.9 C) (Oral)  Resp  17  SpO2 97%  Physical Exam  Nursing note and vitals reviewed. Constitutional: He is oriented to person, place, and time. He appears well-developed and well-nourished. No distress.  HENT:  Head: Normocephalic and atraumatic.  Eyes: EOM are normal.  Neck: Neck supple. No tracheal deviation present.  Cardiovascular: Normal rate.   Pulmonary/Chest: Effort normal. No respiratory distress.  Abdominal: Soft. There is no tenderness.  Genitourinary:  Foley in place with grossly bloody urine  Musculoskeletal: Normal range of motion.  Neurological: He is alert and oriented to person, place, and time.  Skin: Skin is warm and dry.  Psychiatric: He has a normal mood and affect. His behavior is normal.    ED Course  Procedures (including critical care time)  DIAGNOSTIC STUDIES: Oxygen Saturation is 97% on room air, adequate by my interpretation.    COORDINATION OF CARE: 1:54 AM-Discussed treatment plan which includes CBC panel and UA with pt at bedside and pt agreed to plan.  Also advised that I will call Dr. Julien Girt in order to determine a treatment plan. Pt declined pain medication currently.  3:30 AM- Ordered 4 mg morphine and 4 mg Zofran injection  5:10 AM- Spoke with Dr. Laverle Patter, Urology. Pt's case explained and discussed. Dr. Laverle Patter agrees to evaluate the pt in the ED.   6:53 AM-Dr. Laverle Patter completed evaluation in the ED. Informed pt of radiology and lab work results. Discussed discharge plan which includes follow up with urologist and pt agreed.  Labs Reviewed  CBC WITH DIFFERENTIAL - Abnormal; Notable for the following:    WBC 10.8 (*)    RBC 3.99 (*)    Hemoglobin 12.1 (*)    HCT 35.9 (*)    All other components within normal limits  COMPREHENSIVE METABOLIC PANEL - Abnormal; Notable for the following:    Glucose, Bld 113 (*)    Albumin 3.3 (*)    Total Bilirubin 0.2 (*)    All other components within normal limits  URINALYSIS, ROUTINE W REFLEX MICROSCOPIC - Abnormal; Notable  for the following:    Color, Urine RED (*)    Hgb urine dipstick LARGE (*)    Protein, ur 100 (*)    Leukocytes, UA TRACE (*)    All other components within normal limits  URINE MICROSCOPIC-ADD ON   No results found.   1. Hematuria       MDM  I personally performed the services described in this documentation, which was scribed in my presence. The recorded information has been reviewed and is accurate.    Loren Racer, MD 09/12/12 [phone]

## 2012-09-11 NOTE — ED Notes (Signed)
Received pt. With 60ml of bloody urine on drainage bag. Wife reported that before  they left home , there was about 20ml in the drainage bag.

## 2012-09-11 NOTE — ED Notes (Signed)
Pt. From home with complaint of clogged foley catheter that was inserted on 09/09/12 here in ED, pt. Has been having hematuria since the 27th of Feb. Pt. Also complaint of pain  And had periods of bladder spasms and reported of blood clots.

## 2012-09-11 NOTE — ED Notes (Signed)
Patient is alert and oriented x3.  He is complaining of foley cath problems.  He was seen in [facility] last night for same issue. Dr. Laverle Patter is on call and needs to be notified to come look at the foley.   Currently patient denies any pain but does reports intermittent bladder spams

## 2012-09-12 ENCOUNTER — Encounter (HOSPITAL_COMMUNITY): Payer: Self-pay | Admitting: *Deleted

## 2012-09-12 LAB — URINALYSIS, ROUTINE W REFLEX MICROSCOPIC
Bilirubin Urine: NEGATIVE
Glucose, UA: NEGATIVE mg/dL
Protein, ur: 100 mg/dL — AB

## 2012-09-12 LAB — URINE MICROSCOPIC-ADD ON

## 2012-09-12 MED ORDER — ZOLPIDEM TARTRATE 5 MG PO TABS: 5.0000 mg | ORAL_TABLET | Freq: Every evening | ORAL | Status: DC | PRN

## 2012-09-12 MED ORDER — PSYLLIUM 95 % PO PACK
1.0000 | PACK | Freq: Every day | ORAL | Status: DC | PRN
  Administered 2012-09-12: 1 via ORAL
  Filled 2012-09-12: qty 1

## 2012-09-12 MED ORDER — LIDOCAINE HCL 2 % EX GEL
CUTANEOUS | Status: AC
  Filled 2012-09-12: qty 10

## 2012-09-12 MED ORDER — SODIUM CHLORIDE 0.9 % IV SOLN
250.0000 mL | INTRAVENOUS | Status: DC | PRN
  Administered 2012-09-12: 250 mL via INTRAVENOUS

## 2012-09-12 MED ORDER — SENNA 8.6 MG PO TABS
2.0000 | ORAL_TABLET | Freq: Every day | ORAL | Status: DC
  Administered 2012-09-12: 17.2 mg via ORAL
  Filled 2012-09-12: qty 2

## 2012-09-12 MED ORDER — OXYCODONE-ACETAMINOPHEN 5-325 MG PO TABS: 1.0000 | ORAL_TABLET | ORAL | Status: DC | PRN

## 2012-09-12 MED ORDER — DIPHENHYDRAMINE HCL 12.5 MG/5ML PO ELIX: 12.5000 mg | ORAL_SOLUTION | Freq: Four times a day (QID) | ORAL | Status: DC | PRN

## 2012-09-12 MED ORDER — SODIUM CHLORIDE 0.9 % IJ SOLN: 3.0000 mL | Freq: Two times a day (BID) | INTRAMUSCULAR | Status: DC

## 2012-09-12 MED ORDER — BELLADONNA ALKALOIDS-OPIUM 16.2-60 MG RE SUPP
1.0000 | Freq: Four times a day (QID) | RECTAL | Status: DC | PRN
  Administered 2012-09-12: 1 via RECTAL
  Filled 2012-09-12 (×2): qty 1

## 2012-09-12 MED ORDER — HYDROMORPHONE HCL PF 1 MG/ML IJ SOLN
0.5000 mg | INTRAMUSCULAR | Status: DC | PRN
  Administered 2012-09-12 (×3): 1 mg via INTRAVENOUS
  Filled 2012-09-12 (×3): qty 1

## 2012-09-12 MED ORDER — SODIUM CHLORIDE 0.9 % IJ SOLN: 3.0000 mL | INTRAMUSCULAR | Status: DC | PRN

## 2012-09-12 MED ORDER — ALBUTEROL SULFATE HFA 108 (90 BASE) MCG/ACT IN AERS
2.0000 | INHALATION_SPRAY | Freq: Four times a day (QID) | RESPIRATORY_TRACT | Status: DC | PRN
  Filled 2012-09-12: qty 6.7

## 2012-09-12 MED ORDER — ONDANSETRON HCL 4 MG/2ML IJ SOLN: 4.0000 mg | INTRAMUSCULAR | Status: DC | PRN

## 2012-09-12 MED ORDER — DIPHENHYDRAMINE HCL 50 MG/ML IJ SOLN: 12.5000 mg | Freq: Four times a day (QID) | INTRAMUSCULAR | Status: DC | PRN

## 2012-09-12 NOTE — Discharge Summary (Signed)
  Date of admission: 09/11/2012  Date of discharge: 09/12/2012  Admission diagnosis: Hematuria and clot retention  Discharge diagnosis: Same  Secondary diagnoses: Multiple sclerosis  History and Physical: For full details, please see admission history and physical. Briefly, Brian Walsh is a 52 y.o. year old patient with hematuria s/p TURP.  He had to make multiple trips to the ED over the past few days due to hematuria and clot retention of his catheter.   Hospital Course: He presented to the ED last night. I removed his catheter and replaced a 22 Fr coude hematuria catheter and was able to hand irrigate his catheter clear with multiple clots removed. Due to his persistent difficulty with clot retention on a recurrent basis, he was admitted for observation. His urine remained completely clear many hours later and he felt comfortable for discharge home.  Laboratory values:  Recent Labs  09/09/12 1100 09/11/12 0205  HGB 13.4 12.1*  HCT 39.8 35.9*    Recent Labs  09/09/12 1100 09/11/12 0205  CREATININE 0.98 0.93    Disposition: Home  Discharge instruction: The patient was instructed to be ambulatory but told to refrain from heavy lifting, strenuous activity, or driving.   Discharge medications:    Medication List    TAKE these medications       albuterol 108 (90 BASE) MCG/ACT inhaler  Commonly known as:  PROVENTIL HFA;VENTOLIN HFA  Inhale 2 puffs into the lungs every 6 (six) hours as needed for shortness of breath.     Arginine 1000 MG Tabs  Take 1-2 tablets by mouth daily.     loratadine-pseudoephedrine 5-120 MG per tablet  Commonly known as:  CLARITIN-D 12-hour  Take 1 tablet by mouth 2 (two) times daily.     oxyCODONE-acetaminophen 5-325 MG per tablet  Commonly known as:  PERCOCET/ROXICET  Take 1-2 tablets by mouth every 4 (four) hours as needed for pain.     psyllium 95 % Pack  Commonly known as:  HYDROCIL/METAMUCIL  Take 1 packet by mouth daily as needed (if  no relief from Senokot.).     senna 8.6 MG tablet  Commonly known as:  SENOKOT  Take 2 tablets by mouth daily.     SINUS CONGESTION/PAIN DAYTIME PO  Take 1 tablet by mouth daily as needed (for allergies.).     vitamin C 500 MG tablet  Commonly known as:  ASCORBIC ACID  Take 500 mg by mouth daily.        Followup:      Follow-up Information   Follow up with Sebastian Ache, MD. (Monday as scheduled)    Contact information:   [address] [place] [address] [phone]

## 2012-09-12 NOTE — Progress Notes (Signed)
09/12/12 1824 Reviewed discharged instructions with patient. Patient verbalized understanding. Copy of discharge instructions given to patient. Leg bag foley given to patient.

## 2012-09-12 NOTE — ED Provider Notes (Signed)
Medical screening examination/treatment/procedure(s) were performed by non-physician practitioner and as supervising physician I was immediately available for consultation/collaboration. Devoria Albe, MD, Armando Gang   Ward Givens, MD 09/12/12 [phone]

## 2012-09-12 NOTE — ED Notes (Signed)
Bladder scan showed no urine on bladder scan.

## 2012-09-12 NOTE — Progress Notes (Signed)
Patient ID: Brian Walsh, male   DOB: [birth year], 52 y.o.   MRN: [id number]    Subjective: Pt doing well this morning.  Catheter has been draining clearly since he was in the emergency room.  Objective: Vital signs in last 24 hours: Temp:  [97.3 F (36.3 C)-98.3 F (36.8 C)] 97.3 F (36.3 C) (03/02 0614) Pulse Rate:  [66-86] 66 (03/02 0614) Resp:  [16-18] 18 (03/02 0614) BP: (116-132)/(71-86) 116/71 mmHg (03/02 0614) SpO2:  [95 %-99 %] 99 % (03/02 0614) Weight:  [98.2 kg (216 lb 7.9 oz)] 98.2 kg (216 lb 7.9 oz) (03/02 0300)  Intake/Output from previous day:   Intake/Output this shift:    Physical Exam:  General: Alert and oriented Abdomen: Soft, ND GU: Urine is completely clear and draining well  Lab Results:  Recent Labs  09/09/12 1100 09/11/12 0205  HGB 13.4 12.1*  HCT 39.8 35.9*   BMET  Recent Labs  09/09/12 1100 09/11/12 0205  NA 137 138  K 4.2 4.1  CL 103 104  CO2 24 25  GLUCOSE 118* 113*  BUN 21 16  CREATININE 0.98 0.93  CALCIUM 9.2 9.0     Studies/Results: No results found.  Assessment/Plan: He is doing well and I discussed options with him.  He is agreeable to go home this morning which I think would be appropriate considering how clear his urine appears now.   LOS: 1 day   Brian Walsh 09/12/2012, 7:52 AM

## 2012-09-12 NOTE — ED Provider Notes (Signed)
History     CSN: [id number]  Arrival date & time 09/11/12  2316   First MD Initiated Contact with Patient 09/12/12 0107      Chief Complaint  Patient presents with  . Urinary Retention    foley issues   HPI  History provided by the patient and recent medical chart. Patient is a 52 year old male who recently underwent TURP procedure. Patient has had a Foley catheter placed since had significant cough patient is of urinary retention from obstructing blood clots. Symptoms began again last evening. Patient reports being very uncomfortable from pains and discomforts and suprapubic region. Denies any associated symptoms. No vomiting, fever, chills or sweats. He denies any other aggravating or alleviating factors.   Past Medical History  Diagnosis Date  . Multiple sclerosis 1992    Remission since 1996 on no meds.  Initial presentation was gait/balance and leg weakness sx's.  . Allergic rhinitis     Much improved with allergy immunotherapy (molds primarily)  . Asthma     no inhaler requirement since allergies under control with immunotherapy  . PUD (peptic ulcer disease)     Bleeding ulcer 1978  . BPH (benign prostatic hypertrophy)     Rapaflo helps.  PSA's have been normal.  Cysto 06/2012 confirmed trilobed BPH--plan made for TURP procedure  . OSA on CPAP 2005  . Chronic constipation   . Personal history of colonic polyps 03/24/2012    03/2012 - 5 mm cecal adenoma  . Thyroid nodule 05/2012    Right lobe 14mmX14mm nodule found on screening thyroid and carotid u/s done through his employer.  Pt was euthyroid at that time.  A radioactive iodine uptake and scan was normal 06/2012.  Marland Kitchen Chronic kidney disease     urinary frequency  . Cancer     skin cancer     Past Surgical History  Procedure Laterality Date  . Femur fracture surgery  1978    with rod insertion.  Rod removal 1979  . Appendectomy  1986  . Colonoscopy  2006, 2013     diminutive hyperplastic cecal polyp ([place]);  diminutive cecal adenoma removed 2013, recall 2018  . Transurethral resection of prostate N/A 08/25/2012    Procedure: TRANSURETHRAL RESECTION OF THE PROSTATE WITH GYRUS INSTRUMENTS;  Surgeon: Sebastian Ache, MD;  Location: [facility];  Service: Urology;  Laterality: N/A;    Family History  Problem Relation Age of Onset  . Breast cancer Mother   . Parkinsonism Father   . Stroke Father   . Colon polyps Father 7    History  Substance Use Topics  . Smoking status: Never Smoker   . Smokeless tobacco: Never Used  . Alcohol Use: 0.0 oz/week     Comment: occasional wine       Review of Systems  Constitutional: Negative for fever and chills.  All other systems reviewed and are negative.    Allergies  Review of patient's allergies indicates no known allergies.  Home Medications   Current Outpatient Rx  Name  Route  Sig  Dispense  Refill  . albuterol (PROVENTIL HFA;VENTOLIN HFA) 108 (90 BASE) MCG/ACT inhaler   Inhalation   Inhale 2 puffs into the lungs every 6 (six) hours as needed for shortness of breath.          . Arginine 1000 MG TABS   Oral   Take 1-2 tablets by mouth daily.          Marland Kitchen loratadine-pseudoephedrine (CLARITIN-D 12-HOUR) 5-120 MG per tablet  Oral   Take 1 tablet by mouth 2 (two) times daily.         Marland Kitchen oxyCODONE-acetaminophen (PERCOCET/ROXICET) 5-325 MG per tablet   Oral   Take 1-2 tablets by mouth every 4 (four) hours as needed for pain.         Marland Kitchen Phenylephrine-Acetaminophen (SINUS CONGESTION/PAIN DAYTIME PO)   Oral   Take 1 tablet by mouth daily as needed (for allergies.).          Marland Kitchen psyllium (HYDROCIL/METAMUCIL) 95 % PACK   Oral   Take 1 packet by mouth daily as needed (if no relief from Senokot.).          Marland Kitchen senna (SENOKOT) 8.6 MG tablet   Oral   Take 2 tablets by mouth daily.          . vitamin C (ASCORBIC ACID) 500 MG tablet   Oral   Take 500 mg by mouth daily.            BP 132/76  Pulse 86  Temp(Src) 98.3 F (36.8 C)  (Oral)  Resp 16  SpO2 95%  Physical Exam  Nursing note and vitals reviewed. Constitutional: He appears well-developed and well-nourished. No distress.  HENT:  Head: Normocephalic.  Eyes: Conjunctivae are normal.  Cardiovascular: Normal rate and regular rhythm.   Pulmonary/Chest: Effort normal and breath sounds normal.  Abdominal: Soft.  Musculoskeletal: Normal range of motion.  Neurological: He is alert.  Skin: Skin is warm.  Psychiatric: He has a normal mood and affect. His behavior is normal.    ED Course  Procedures   Labs Reviewed  URINALYSIS, ROUTINE W REFLEX MICROSCOPIC   No results found.   1. Urinary retention   2. Foley catheter problem, initial encounter       MDM  Patient seen and evaluated. Patient currently appears comfortable. On-call urologist was currently in the hospital and is in the emergency room to see patient. He plans to admit the patient after changing Foley catheter in the emergency room.        TRAVEN DAVIDS, PA 09/12/12 0200

## 2012-09-12 NOTE — H&P (Signed)
H&P  Chief Complaint: Hematuria and clot retention  History of Present Illness: Mr. Brian Walsh is a 52 year old patient of Dr. Berneice Heinrich s/p TURP on 08/25/12.  He developed hematuria earlier this week requiring catheter placement.  He has since been in the ED 3 nights consecutively due to clot retention of his catheter.  This did happen again tonight.  He has not been febrile and his catheter is currently draining.  He has a 22 Fr 3 way catheter in place which was placed in the ED a couple of night ago.  Past Medical History  Diagnosis Date  . Multiple sclerosis 1992    Remission since 1996 on no meds.  Initial presentation was gait/balance and leg weakness sx's.  . Allergic rhinitis     Much improved with allergy immunotherapy (molds primarily)  . Asthma     no inhaler requirement since allergies under control with immunotherapy  . PUD (peptic ulcer disease)     Bleeding ulcer 1978  . BPH (benign prostatic hypertrophy)     Rapaflo helps.  PSA's have been normal.  Cysto 06/2012 confirmed trilobed BPH--plan made for TURP procedure  . OSA on CPAP 2005  . Chronic constipation   . Personal history of colonic polyps 03/24/2012    03/2012 - 5 mm cecal adenoma  . Thyroid nodule 05/2012    Right lobe 38mmX14mm nodule found on screening thyroid and carotid u/s done through his employer.  Pt was euthyroid at that time.  A radioactive iodine uptake and scan was normal 06/2012.  Marland Kitchen Chronic kidney disease     urinary frequency  . Cancer     skin cancer     Past Surgical History  Procedure Laterality Date  . Femur fracture surgery  1978    with rod insertion.  Rod removal 1979  . Appendectomy  1986  . Colonoscopy  2006, 2013     diminutive hyperplastic cecal polyp ([place]); diminutive cecal adenoma removed 2013, recall 2018  . Transurethral resection of prostate N/A 08/25/2012    Procedure: TRANSURETHRAL RESECTION OF THE PROSTATE WITH GYRUS INSTRUMENTS;  Surgeon: Sebastian Ache, MD;  Location: [facility];   Service: Urology;  Laterality: N/A;    Home Medications:     Medication List    ASK your doctor about these medications       albuterol 108 (90 BASE) MCG/ACT inhaler  Commonly known as:  PROVENTIL HFA;VENTOLIN HFA  Inhale 2 puffs into the lungs every 6 (six) hours as needed for shortness of breath.     Arginine 1000 MG Tabs  Take 1-2 tablets by mouth daily.     loratadine-pseudoephedrine 5-120 MG per tablet  Commonly known as:  CLARITIN-D 12-hour  Take 1 tablet by mouth 2 (two) times daily.     oxyCODONE-acetaminophen 5-325 MG per tablet  Commonly known as:  PERCOCET/ROXICET  Take 1-2 tablets by mouth every 4 (four) hours as needed for pain.     psyllium 95 % Pack  Commonly known as:  HYDROCIL/METAMUCIL  Take 1 packet by mouth daily as needed (if no relief from Senokot.).     senna 8.6 MG tablet  Commonly known as:  SENOKOT  Take 2 tablets by mouth daily.     SINUS CONGESTION/PAIN DAYTIME PO  Take 1 tablet by mouth daily as needed (for allergies.).     vitamin C 500 MG tablet  Commonly known as:  ASCORBIC ACID  Take 500 mg by mouth daily.        (  Not in a hospital admission)  Allergies: No Known Allergies  Family History  Problem Relation Age of Onset  . Breast cancer Mother   . Parkinsonism Father   . Stroke Father   . Colon polyps Father 58    Social History:  reports that he has never smoked. He has never used smokeless tobacco. He reports that  drinks alcohol. He reports that he does not use illicit drugs.  ROS: A complete review of systems was performed.  All systems are negative except for pertinent findings as noted.  Physical Exam:  Vital signs in last 24 hours: Temp:  [97.5 F (36.4 C)-98.3 F (36.8 C)] 98.3 F (36.8 C) (03/01 2321) Pulse Rate:  [78-86] 86 (03/01 2321) Resp:  [16-18] 16 (03/01 2321) BP: (120-132)/(68-76) 132/76 mmHg (03/01 2321) SpO2:  [95 %-97 %] 95 % (03/01 2321) General:  Alert and oriented, No acute distress HEENT:  Normocephalic, atraumatic Neck: No JVD or lymphadenopathy Lungs: Normal respiratory effort Abdomen: Soft, nontender, nondistended, no abdominal masses Genitourinary:  He has an indwelling catheter which is draining currently with tea colored urine. Neurologic: Grossly intact  Laboratory Data:   Recent Labs  09/09/12 1100 09/11/12 0205  WBC 7.3 10.8*  HGB 13.4 12.1*  HCT 39.8 35.9*  PLT 293 270     Recent Labs  09/09/12 1100 09/11/12 0205  NA 137 138  K 4.2 4.1  CL 103 104  GLUCOSE 118* 113*  BUN 21 16  CALCIUM 9.2 9.0  CREATININE 0.98 0.93     Results for orders placed during the hospital encounter of 09/11/12 (from the past 24 hour(s))  URINALYSIS, ROUTINE W REFLEX MICROSCOPIC     Status: Abnormal   Collection Time    09/12/12  1:50 AM      Result Value Range   Color, Urine RED (*) YELLOW   APPearance CLOUDY (*) CLEAR   Specific Gravity, Urine 1.014  1.005 - 1.030   pH 5.0  5.0 - 8.0   Glucose, UA NEGATIVE  NEGATIVE mg/dL   Hgb urine dipstick LARGE (*) NEGATIVE   Bilirubin Urine NEGATIVE  NEGATIVE   Ketones, ur TRACE (*) NEGATIVE mg/dL   Protein, ur 161 (*) NEGATIVE mg/dL   Urobilinogen, UA 0.2  0.0 - 1.0 mg/dL   Nitrite NEGATIVE  NEGATIVE   Leukocytes, UA SMALL (*) NEGATIVE  URINE MICROSCOPIC-ADD ON     Status: Abnormal   Collection Time    09/12/12  1:50 AM      Result Value Range   Squamous Epithelial / LPF FEW (*) RARE   WBC, UA 3-6  <3 WBC/hpf   RBC / HPF 21-50  <3 RBC/hpf   Bacteria, UA MANY (*) RARE   Urine-Other URINALYSIS PERFORMED ON SUPERNATANT     No results found for this or any previous visit (from the past 240 hour(s)).  Renal Function:  Recent Labs  09/09/12 1100 09/11/12 0205  CREATININE 0.98 0.93   The CrCl is unknown because both a height and weight (above a minimum accepted value) are required for this calculation.  Procedure: His catheter was removed and under sterile conditions and with 2% lidocaine topical anesthetic, a  new 22 Fr hematuria Coude catheter was placed.  Multiple clots were hand irrigated from the catheter until the urine was completely clear and irrigating well.  Impression/Assessment:  Clot retention with hematuria  Plan:  I will admit him for observation considering the fact he has now been to the ED 3 nights in  a row due to continue problems with clot retention.  He will be reassessed later today and we will discuss discharge home if he has no further problems with hematuria or catheter blockage.  BORDEN,LES 09/12/2012, 2:15 AM  Moody Bruins MD

## 2012-09-12 NOTE — Care Management Note (Signed)
    Page 1 of 1   09/12/2012     4:15:51 PM   CARE MANAGEMENT NOTE 09/12/2012  Patient:  Brian Walsh, Brian Walsh   Account Number:  [id number]  Date Initiated:  09/12/2012  Documentation initiated by:  Lanier Clam  Subjective/Objective Assessment:   ADMITTED W/HEMATURIA &  CLOT RETENTION.     Action/Plan:   FROM HOME.HAS PCP,PHARMACY.   Anticipated DC Date:  09/12/2012   Anticipated DC Plan:  HOME/SELF CARE      DC Planning Services  CM consult      Choice offered to / List presented to:             Status of service:  Completed, signed off Medicare Important Message given?   (If response is "NO", the following Medicare IM given date fields will be blank) Date Medicare IM given:   Date Additional Medicare IM given:    Discharge Disposition:  HOME/SELF CARE  Per UR Regulation:  Reviewed for med. necessity/level of care/duration of stay  If discussed at Long Length of Stay Meetings, dates discussed:    Comments:  09/12/12 KATHY MAHABIR RN,BSN NCM [phone] NO D/C ORDER OR NEEDS.

## 2012-09-12 NOTE — ED Notes (Signed)
Foley placed PTA

## 2012-09-14 LAB — URINE CULTURE

## 2012-12-08 ENCOUNTER — Ambulatory Visit (INDEPENDENT_AMBULATORY_CARE_PROVIDER_SITE_OTHER): Payer: BC Managed Care – PPO | Admitting: Family Medicine

## 2012-12-08 ENCOUNTER — Encounter: Payer: Self-pay | Admitting: Family Medicine

## 2012-12-08 VITALS — BP 137/83 | HR 55 | Temp 97.9°F | Resp 16 | Wt 218.2 lb

## 2012-12-08 DIAGNOSIS — B882 Other arthropod infestations: Secondary | ICD-10-CM

## 2012-12-08 DIAGNOSIS — A94 Unspecified arthropod-borne viral fever: Secondary | ICD-10-CM

## 2012-12-08 MED ORDER — DOXYCYCLINE HYCLATE 100 MG PO CAPS: 100.0000 mg | ORAL_CAPSULE | Freq: Two times a day (BID) | ORAL | Status: DC

## 2012-12-08 NOTE — Progress Notes (Signed)
OFFICE NOTE  12/08/2012  CC:  Chief Complaint  Patient presents with  . Insect Bite    Pt c/o tick bite on chest [between breast] x10 days w/fatigue, joint pain, and abdominal "discomfort"     HPI: Patient is a 52 y.o. Caucasian male who is here for fatigue/joint pain.   Onset 6-7 d/a, generalized fatigue/malaise, joints hurt and diffuse muscle aches-all over.  No signif HA except his usual "sinus" HA that he has most days associated with his chronic allergies.  No fever. Tick pulled off central chest several days prior to onset of current sx's.  No rash.    ROS: no n/v/abd pain/diarrhea or joint swelling.   Pertinent PMH:  Past Medical History  Diagnosis Date  . Multiple sclerosis 1992    Remission since 1996 on no meds.  Initial presentation was gait/balance and leg weakness sx's.  . Allergic rhinitis     Much improved with allergy immunotherapy (molds primarily)  . Asthma     no inhaler requirement since allergies under control with immunotherapy  . PUD (peptic ulcer disease)     Bleeding ulcer 1978  . BPH (benign prostatic hypertrophy)     Rapaflo helps.  PSA's have been normal.  Cysto 06/2012 confirmed trilobed BPH--plan made for TURP procedure  . OSA on CPAP 2005  . Chronic constipation   . Personal history of colonic polyps 03/24/2012    03/2012 - 5 mm cecal adenoma  . Thyroid nodule 05/2012    Right lobe 65mmX14mm nodule found on screening thyroid and carotid u/s done through his employer.  Pt was euthyroid at that time.  A radioactive iodine uptake and scan was normal 06/2012.  Marland Kitchen Chronic kidney disease     urinary frequency  . Cancer     skin cancer    Past surgical, social, and family history reviewed and no changes noted since last office visit.  MEDS:  Outpatient Prescriptions Prior to Visit  Medication Sig Dispense Refill  . albuterol (PROVENTIL HFA;VENTOLIN HFA) 108 (90 BASE) MCG/ACT inhaler Inhale 2 puffs into the lungs every 6 (six) hours as needed for  shortness of breath.       . Arginine 1000 MG TABS Take 1-2 tablets by mouth daily.       Marland Kitchen loratadine-pseudoephedrine (CLARITIN-D 12-HOUR) 5-120 MG per tablet Take 1 tablet by mouth 2 (two) times daily.      Marland Kitchen Phenylephrine-Acetaminophen (SINUS CONGESTION/PAIN DAYTIME PO) Take 1 tablet by mouth daily as needed (for allergies.).       Marland Kitchen psyllium (HYDROCIL/METAMUCIL) 95 % PACK Take 1 packet by mouth daily as needed (if no relief from Senokot.).       Marland Kitchen senna (SENOKOT) 8.6 MG tablet Take 2 tablets by mouth daily.       . vitamin C (ASCORBIC ACID) 500 MG tablet Take 500 mg by mouth daily.       Marland Kitchen oxyCODONE-acetaminophen (PERCOCET/ROXICET) 5-325 MG per tablet Take 1-2 tablets by mouth every 4 (four) hours as needed for pain.       No facility-administered medications prior to visit.    PE: Blood pressure 137/83, pulse 55, temperature 97.9 F (36.6 C), temperature source Oral, resp. rate 16, weight 218 lb 4 oz (98.998 kg), SpO2 96.00%. Gen: Alert, tired-appearing but certainly in NAD   Patient is oriented to person, place, time, and situation. AFFECT: pleasant, lucid thought and speech. ENT:  Eyes: no injection, icteris, swelling, or exudate.  EOMI, PERRLA. Nose: no drainage or turbinate  edema/swelling.  No injection or focal lesion.  Mouth: lips without lesion/swelling.  Oral mucosa pink and moist.  Dentition intact and without obvious caries or gingival swelling.  Oropharynx without erythema, exudate, or swelling.  Neck - No masses or thyromegaly or limitation in range of motion CV: RRR, no m/r/g.   LUNGS: CTA bilat, nonlabored resps, good aeration in all lung fields. ABD: soft, NT/ND EXT: trace pitting edema bilat, no cyanosis or clubbing Musculoskeletal: no joint swelling, erythema, or warmth.  ROM of all joints intact.  Minimal symmetric forearm mm's TTP. Skin - no sores or suspicious lesions or rashes or color changes    LAB: none today  IMPRESSION AND PLAN:  Tick-borne  disease Empiric doxycycline 100mg  bid x7d. Signs/symptoms to call or return for were reviewed and pt expressed understanding.    FOLLOW UP: prn

## 2012-12-08 NOTE — Assessment & Plan Note (Addendum)
Empiric doxycycline 100mg  bid x7d. Signs/symptoms to call or return for were reviewed and pt expressed understanding.

## 2013-07-12 ENCOUNTER — Encounter: Payer: Self-pay | Admitting: Family Medicine

## 2013-07-12 ENCOUNTER — Ambulatory Visit (INDEPENDENT_AMBULATORY_CARE_PROVIDER_SITE_OTHER): Payer: BC Managed Care – PPO | Admitting: Family Medicine

## 2013-07-12 VITALS — BP 131/87 | HR 77 | Temp 98.7°F | Resp 18 | Ht 69.5 in | Wt 224.0 lb

## 2013-07-12 DIAGNOSIS — J45909 Unspecified asthma, uncomplicated: Secondary | ICD-10-CM

## 2013-07-12 DIAGNOSIS — G4733 Obstructive sleep apnea (adult) (pediatric): Secondary | ICD-10-CM

## 2013-07-12 DIAGNOSIS — Z9989 Dependence on other enabling machines and devices: Secondary | ICD-10-CM

## 2013-07-12 DIAGNOSIS — J329 Chronic sinusitis, unspecified: Secondary | ICD-10-CM | POA: Insufficient documentation

## 2013-07-12 MED ORDER — LORATADINE-PSEUDOEPHEDRINE ER 5-120 MG PO TB12: 1.0000 | ORAL_TABLET | Freq: Two times a day (BID) | ORAL | Status: DC

## 2013-07-12 MED ORDER — ALBUTEROL SULFATE HFA 108 (90 BASE) MCG/ACT IN AERS: 2.0000 | INHALATION_SPRAY | RESPIRATORY_TRACT | Status: DC | PRN

## 2013-07-12 MED ORDER — AMOXICILLIN-POT CLAVULANATE 875-125 MG PO TABS: 1.0000 | ORAL_TABLET | Freq: Two times a day (BID) | ORAL | Status: DC

## 2013-07-12 MED ORDER — FLUTICASONE PROPIONATE 50 MCG/ACT NA SUSP: NASAL | Status: DC

## 2013-07-12 NOTE — Progress Notes (Signed)
OFFICE NOTE  07/12/2013  CC:  Chief Complaint  Patient presents with  . Nasal Congestion     HPI: Patient is a 52 y.o. Caucasian male who is here for nasal congestion. Nasal congestion with sinus HA x 1 wk, has not had any claritin D lately and needs rf.  No fever. Nasal mucous sticky/thick, +PND. Feels like his asthma is back some lately (mild wheeze, occ dry cough), albut inhaler expired. He uses a saline nasal rinse daily.  He requests an ENT referral to further discuss his chronic/recurrent rhinosinusitis.  He also requests rx today for CPAP mask, tubing, and water chamber.  Pertinent PMH:  Past Medical History  Diagnosis Date  . Multiple sclerosis 1992    Remission since 1996 on no meds.  Initial presentation was gait/balance and leg weakness sx's.  . Allergic rhinitis     Much improved with allergy immunotherapy (molds primarily)  . Asthma     no inhaler requirement since allergies under control with immunotherapy  . PUD (peptic ulcer disease)     Bleeding ulcer 1978  . BPH (benign prostatic hypertrophy)     Rapaflo helps.  PSA's have been normal.  Cysto 06/2012 confirmed trilobed BPH--plan made for TURP procedure  . OSA on CPAP 2005  . Chronic constipation   . Personal history of colonic polyps 03/24/2012    03/2012 - 5 mm cecal adenoma  . Thyroid nodule 05/2012    Right lobe 81mmX14mm nodule found on screening thyroid and carotid u/s done through his employer.  Pt was euthyroid at that time.  A radioactive iodine uptake and scan was normal 06/2012.  Marland Kitchen Chronic kidney disease     urinary frequency  . Cancer     skin cancer   . Constipation, chronic 11/24/2011    MEDS: *Not taking doxycycline listed below. Outpatient Prescriptions Prior to Visit  Medication Sig Dispense Refill  . Arginine 1000 MG TABS Take 1-2 tablets by mouth daily.       . vitamin C (ASCORBIC ACID) 500 MG tablet Take 500 mg by mouth daily.       Marland Kitchen albuterol (PROVENTIL HFA;VENTOLIN HFA) 108 (90  BASE) MCG/ACT inhaler Inhale 2 puffs into the lungs every 6 (six) hours as needed for shortness of breath.       . loratadine-pseudoephedrine (CLARITIN-D 12-HOUR) 5-120 MG per tablet Take 1 tablet by mouth 2 (two) times daily.      Marland Kitchen Phenylephrine-Acetaminophen (SINUS CONGESTION/PAIN DAYTIME PO) Take 1 tablet by mouth daily as needed (for allergies.).       Marland Kitchen psyllium (HYDROCIL/METAMUCIL) 95 % PACK Take 1 packet by mouth daily as needed (if no relief from Senokot.).       Marland Kitchen senna (SENOKOT) 8.6 MG tablet Take 2 tablets by mouth daily.       Marland Kitchen doxycycline (VIBRAMYCIN) 100 MG capsule Take 1 capsule (100 mg total) by mouth 2 (two) times daily.  14 capsule  0   No facility-administered medications prior to visit.    PE: Blood pressure 131/87, pulse 77, temperature 98.7 F (37.1 C), temperature source Temporal, resp. rate 18, height 5' 9.5" (1.765 m), weight 224 lb (101.606 kg), SpO2 97.00%. VS: noted--normal. Gen: alert, NAD, NONTOXIC APPEARING. HEENT: eyes without injection, drainage, or swelling.  Ears: EACs clear, TMs with normal light reflex and landmarks.  Nose: Clear rhinorrhea, with some dried, crusty exudate adherent to mildly injected and edematous turbinates.  No purulent d/c.  Mild diffuse paranasal sinus TTP.  No facial  swelling.  Throat and mouth without focal lesion.  No pharyngial swelling, erythema, or exudate.   Neck: supple, no LAD.   LUNGS: CTA bilat except for intermittent coarse exp wheeze.  Exp phase is not prolonged.  Nonlabored resps.   CV: RRR, no m/r/g. EXT: no c/c/e SKIN: no rash  IMPRESSION AND PLAN:  Acute sinusitis, hx of recurrent/chronic rhinosinusitis. Also having mild athmatic sx's lately associated with his recent UR problems.  Plan: ENT referral as per pt request. Augmentin rx, renew claritin D rx, start flonase qd, renew albuterol inhaler rx. Rx for CPAP supplies handed to pt today.  An After Visit Summary was printed and given to the patient.  FOLLOW  UP: prn

## 2013-07-12 NOTE — Progress Notes (Signed)
Pre visit review using our clinic review tool, if applicable. No additional management support is needed unless otherwise documented below in the visit note. 

## 2013-07-19 ENCOUNTER — Other Ambulatory Visit: Payer: Self-pay | Admitting: Family Medicine

## 2013-07-19 MED ORDER — LORATADINE-PSEUDOEPHEDRINE ER 5-120 MG PO TB12: 1.0000 | ORAL_TABLET | Freq: Two times a day (BID) | ORAL | Status: DC

## 2013-07-19 NOTE — Telephone Encounter (Signed)
Patient called stating that his mail order pharmacy would not rx refill claritin d for 3 month supply. Patient requested for it to be sent to [facility].  I sent medication into pharmacy.

## 2013-08-09 ENCOUNTER — Encounter: Payer: Self-pay | Admitting: Family Medicine

## 2013-10-26 ENCOUNTER — Encounter: Payer: Self-pay | Admitting: Family Medicine

## 2014-06-29 ENCOUNTER — Ambulatory Visit: Payer: BC Managed Care – PPO | Admitting: Psychology

## 2014-07-03 ENCOUNTER — Ambulatory Visit (INDEPENDENT_AMBULATORY_CARE_PROVIDER_SITE_OTHER): Payer: BC Managed Care – PPO | Admitting: Family Medicine

## 2014-07-03 ENCOUNTER — Encounter: Payer: Self-pay | Admitting: Family Medicine

## 2014-07-03 VITALS — BP 144/88 | HR 87 | Temp 97.9°F | Ht 69.5 in | Wt 228.0 lb

## 2014-07-03 DIAGNOSIS — J0101 Acute recurrent maxillary sinusitis: Secondary | ICD-10-CM

## 2014-07-03 DIAGNOSIS — M542 Cervicalgia: Secondary | ICD-10-CM

## 2014-07-03 DIAGNOSIS — E041 Nontoxic single thyroid nodule: Secondary | ICD-10-CM

## 2014-07-03 MED ORDER — AMOXICILLIN-POT CLAVULANATE 875-125 MG PO TABS: 1.0000 | ORAL_TABLET | Freq: Two times a day (BID) | ORAL | Status: DC

## 2014-07-03 MED ORDER — FLUTICASONE PROPIONATE 50 MCG/ACT NA SUSP: NASAL | Status: DC

## 2014-07-03 MED ORDER — LORATADINE-PSEUDOEPHEDRINE ER 5-120 MG PO TB12: 1.0000 | ORAL_TABLET | Freq: Two times a day (BID) | ORAL | Status: DC

## 2014-07-03 MED ORDER — ALBUTEROL SULFATE HFA 108 (90 BASE) MCG/ACT IN AERS: 2.0000 | INHALATION_SPRAY | RESPIRATORY_TRACT | Status: DC | PRN

## 2014-07-03 NOTE — Progress Notes (Signed)
Pre visit review using our clinic review tool, if applicable. No additional management support is needed unless otherwise documented below in the visit note. 

## 2014-07-03 NOTE — Progress Notes (Signed)
OFFICE NOTE  07/03/2014  CC:  Chief Complaint  Patient presents with  . Thyroid Problem   HPI: Patient is a 53 y.o. Caucasian male who is here for change in right thyroid nodule.   Employer redid neck u/s approx 2 mo ago and his prev detected right thyr nodule was bigger--he does not have the paper with him today but he recalls one dimension being 17 mm.  Then, in the last 2-3 wks he feels vague feeling of pressure and heat in right side of neck on and off.  Denies dysphagia. No fatigue.  No neck strain/injury recalled.  After initial detection of nodule in 2013 by screening neck u/s via his employer at a health fair, we ended up not having him see an endocrinologist (see [place] below).  More sinus congestion than what is typical for him lately.  Has seen ENT and he is a candidate for surgery but has put it off for now.  No fevers.  Occ tight/dry "asthmatic" cough.    Pertinent PMH:  Past Medical History  Diagnosis Date  . Multiple sclerosis 1992    Remission since 1996 on no meds.  Initial presentation was gait/balance and leg weakness sx's.  . Allergic rhinitis     Much improved with allergy immunotherapy (molds primarily)  . Asthma     no inhaler requirement since allergies under control with immunotherapy  . PUD (peptic ulcer disease)     Bleeding ulcer 1978  . BPH (benign prostatic hypertrophy)     Rapaflo helps.  PSA's have been normal.  Cysto 06/2012 confirmed trilobed BPH--plan made for TURP procedure  . OSA on CPAP 2005  . Chronic constipation   . Personal history of colonic polyps 03/24/2012    03/2012 - 5 mm cecal adenoma  . Thyroid nodule 05/2012    Right lobe 92mmX14mm nodule found on screening thyroid and carotid u/s done through his employer.  Pt was euthyroid at that time.  A radioactive iodine uptake and scan was normal 06/2012.  Marland Kitchen Chronic kidney disease     urinary frequency  . Cancer     skin cancer   . Constipation, chronic 11/24/2011  . Chronic sinusitis    Deviated nasal septum: >90% airway obstruction on right.   Past Surgical History  Procedure Laterality Date  . Femur fracture surgery  1978    with rod insertion.  Rod removal 1979  . Appendectomy  1986  . Colonoscopy  2006, 2013     diminutive hyperplastic cecal polyp ([place]); diminutive cecal adenoma removed 2013, recall 2018  . Transurethral resection of prostate N/A 08/25/2012    Procedure: TRANSURETHRAL RESECTION OF THE PROSTATE WITH GYRUS INSTRUMENTS;  Surgeon: Alexis Frock, MD;  Location: [facility];  Service: Urology;  Laterality: N/A;--patient got excellent results from procedure.    MEDS:  Outpatient Prescriptions Prior to Visit  Medication Sig Dispense Refill  . albuterol (PROVENTIL HFA;VENTOLIN HFA) 108 (90 BASE) MCG/ACT inhaler Inhale 2 puffs into the lungs every 4 (four) hours as needed for shortness of breath. 2 Inhaler 1  . Arginine 1000 MG TABS Take 1-2 tablets by mouth daily.     . fluticasone (FLONASE) 50 MCG/ACT nasal spray 2 sprays each nostril once each morning 48 g 3  . loratadine-pseudoephedrine (CLARITIN-D 12-HOUR) 5-120 MG per tablet Take 1 tablet by mouth 2 (two) times daily. 60 tablet 11  . Omega-3 Fatty Acids (FISH OIL) 1000 MG CAPS Take by mouth.    . Phenylephrine-Acetaminophen (SINUS CONGESTION/PAIN DAYTIME  PO) Take 1 tablet by mouth daily as needed (for allergies.).     Marland Kitchen psyllium (HYDROCIL/METAMUCIL) 95 % PACK Take 1 packet by mouth daily as needed (if no relief from Senokot.).     Marland Kitchen vitamin C (ASCORBIC ACID) 500 MG tablet Take 500 mg by mouth daily.     Marland Kitchen amoxicillin-clavulanate (AUGMENTIN) 875-125 MG per tablet Take 1 tablet by mouth 2 (two) times daily. 28 tablet 0  . senna (SENOKOT) 8.6 MG tablet Take 2 tablets by mouth daily.      No facility-administered medications prior to visit.    PE: Blood pressure 144/88, pulse 87, temperature 97.9 F (36.6 C), temperature source Oral, height 5' 9.5" (1.765 m), weight 228 lb (103.42 kg), SpO2 97 %. VS:  noted--normal. Gen: alert, NAD, NONTOXIC APPEARING. HEENT: eyes without injection, drainage, or swelling.  Ears: EACs clear, TMs with normal light reflex and landmarks.  Nose: Clear rhinorrhea, with some dried, crusty exudate adherent to mildly injected mucosa.  No purulent d/c.  No paranasal sinus TTP.  No facial swelling.  Throat and mouth without focal lesion.  No pharyngial swelling, erythema, or exudate.   Neck: supple, no LAD.  I cannot palpate any thyroid gland tissue.  No tenderness or mass in area that patient points out as his area of concern where he feels subtle pressure/burning POSTERIOR TO THE SCM muscle on right side of neck.   LUNGS: CTA bilat, nonlabored resps.   CV: RRR, no m/r/g. EXT: no c/c/e SKIN: no rash  LAB: none today RECENT: Lab Results  Component Value Date   TSH 0.89 06/04/2012   IMPRESSION AND PLAN:  1) Right sided thyroid nodule: recent repeat u/s showed possible enlargement but he needs to fax me the report so we can see the dimensions that were measured, as he does not have all the info today (in 2013 the dimensions were 12 mm x 14 mm).  We'll see if the nodule meets criteria for FNA.  Check TSH today. The neck sx's he is having are far removed anatomically from thyroid region.  I reassured him about this today.  2) Acute sinusitis, recurrent: Augmentin 875mg  bid x 10d.    An After Visit Summary was printed and given to the patient.  FOLLOW UP: prn

## 2014-07-04 LAB — TSH: TSH: 1.05 u[IU]/mL (ref 0.35–4.50)

## 2014-11-22 ENCOUNTER — Ambulatory Visit (INDEPENDENT_AMBULATORY_CARE_PROVIDER_SITE_OTHER): Payer: BLUE CROSS/BLUE SHIELD | Admitting: Family Medicine

## 2014-11-22 ENCOUNTER — Encounter: Payer: Self-pay | Admitting: Family Medicine

## 2014-11-22 VITALS — BP 133/90 | HR 81 | Temp 97.7°F | Resp 16 | Wt 217.0 lb

## 2014-11-22 DIAGNOSIS — E041 Nontoxic single thyroid nodule: Secondary | ICD-10-CM

## 2014-11-22 DIAGNOSIS — K4090 Unilateral inguinal hernia, without obstruction or gangrene, not specified as recurrent: Secondary | ICD-10-CM | POA: Diagnosis not present

## 2014-11-22 DIAGNOSIS — K5900 Constipation, unspecified: Secondary | ICD-10-CM | POA: Diagnosis not present

## 2014-11-22 DIAGNOSIS — K5909 Other constipation: Secondary | ICD-10-CM

## 2014-11-22 NOTE — Progress Notes (Signed)
Pre visit review using our clinic review tool, if applicable. No additional management support is needed unless otherwise documented below in the visit note. 

## 2014-11-22 NOTE — Progress Notes (Signed)
OFFICE VISIT  11/22/2014   CC:  Chief Complaint  Patient presents with  . Groin Pain  . Follow-up    follow up on thyroid scan   HPI:    Patient is a 54 y.o. Caucasian male who presents for notable swelling in R groin for the last 3 mo.  Started with pain, then noted intermittent swelling focally is noted when intra-abdominal pressure is increased.  Periodically he feels a pain/strain with an odd movement and this lasts seconds.  No prolonged pain.  Occ it feels like R hemiscrotum is "fuller". No probs with eating/drinking. Constipation has always been something he has fought.  Also, he brings in the report of the repeat thyroid u/s he had done 04/2014--this was done by his employer via a health fair and has only size documented, no sonographic characteristics: size 14 mm x 17 mm.   We discussed this nodule and the fact that it had changed but pt did not have the report/details at his last visit in 06/2014 and he was supposed to have given me a report after that visit so I could determine the amount of growth compared to our last u/s.  Denies any tenderness, difficulty swallowing, or abnormal feeling in the area of the thyroid/anterior neck region.   Past Medical History  Diagnosis Date  . Multiple sclerosis 1992    Remission since 1996 on no meds.  Initial presentation was gait/balance and leg weakness sx's.  . Allergic rhinitis     Much improved with allergy immunotherapy (molds primarily)  . Asthma     no inhaler requirement since allergies under control with immunotherapy  . PUD (peptic ulcer disease)     Bleeding ulcer 1978  . BPH (benign prostatic hypertrophy)     Rapaflo helps.  PSA's have been normal.  Cysto 06/2012 confirmed trilobed BPH--plan made for TURP procedure  . OSA on CPAP 2005  . Chronic constipation   . Personal history of colonic polyps 03/24/2012    03/2012 - 5 mm cecal adenoma  . Thyroid nodule 05/2012    Right lobe 52mmX14mm nodule found on screening thyroid  and carotid u/s done through his employer.  Pt was euthyroid at that time.  A radioactive iodine uptake and scan was normal 06/2012.  Marland Kitchen Chronic kidney disease     urinary frequency  . Cancer     skin cancer   . Constipation, chronic 11/24/2011  . Chronic sinusitis     Deviated nasal septum: >90% airway obstruction on right.    Past Surgical History  Procedure Laterality Date  . Femur fracture surgery  1978    with rod insertion.  Rod removal 1979  . Appendectomy  1986  . Colonoscopy  2006, 2013     diminutive hyperplastic cecal polyp ([place]); diminutive cecal adenoma removed 2013, recall 2018  . Transurethral resection of prostate N/A 08/25/2012    Procedure: TRANSURETHRAL RESECTION OF THE PROSTATE WITH GYRUS INSTRUMENTS;  Surgeon: Alexis Frock, MD;  Location: [facility];  Service: Urology;  Laterality: N/A;--patient got excellent results from procedure.    Outpatient Prescriptions Prior to Visit  Medication Sig Dispense Refill  . albuterol (PROVENTIL HFA;VENTOLIN HFA) 108 (90 BASE) MCG/ACT inhaler Inhale 2 puffs into the lungs every 4 (four) hours as needed for shortness of breath. 2 Inhaler 1  . Arginine 1000 MG TABS Take 1-2 tablets by mouth daily.     . fluticasone (FLONASE) 50 MCG/ACT nasal spray 2 sprays each nostril once each morning 48 g  3  . loratadine-pseudoephedrine (CLARITIN-D 12-HOUR) 5-120 MG per tablet Take 1 tablet by mouth 2 (two) times daily. 60 tablet 11  . Omega-3 Fatty Acids (FISH OIL) 1000 MG CAPS Take by mouth.    . Phenylephrine-Acetaminophen (SINUS CONGESTION/PAIN DAYTIME PO) Take 1 tablet by mouth daily as needed (for allergies.).     Marland Kitchen psyllium (HYDROCIL/METAMUCIL) 95 % PACK Take 1 packet by mouth daily as needed (if no relief from Senokot.).     Marland Kitchen vitamin C (ASCORBIC ACID) 500 MG tablet Take 500 mg by mouth daily.     Marland Kitchen amoxicillin-clavulanate (AUGMENTIN) 875-125 MG per tablet Take 1 tablet by mouth 2 (two) times daily. (Patient not taking: Reported on  11/22/2014) 20 tablet 0   No facility-administered medications prior to visit.    No Known Allergies  ROS As per HPI  PE: Blood pressure 133/90, pulse 81, temperature 97.7 F (36.5 C), temperature source Oral, resp. rate 16, weight 217 lb (98.431 kg), SpO2 95 %. Gen: Alert, well appearing.  Patient is oriented to person, place, time, and situation. YFR:TMYT: no injection, icteris, swelling, or exudate.  EOMI, PERRLA. Mouth: lips without lesion/swelling.  Oral mucosa pink and moist. Oropharynx without erythema, exudate, or swelling.  Neck - No masses or thyromegaly or limitation in range of motion.  I cannot palpate any thyroid nodularity. ABD: soft, NT/ND GU: focal R groin tenderness/discomfort and mild focal bulge in this area with coughing.  The bulging no longer is palpable when pt is in supine position.  Normal penis and testicles, w/out any scrotal mass or tenderness/mass/fullness of spermatic cord contents.   LABS:  Lab Results  Component Value Date   TSH 1.05 07/03/2014     IMPRESSION AND PLAN:  1) Right (direct) inguinal hernia, w/out much symptomatology at this time. Discussed options at this time: watchful waiting vs referral for consideration of elective repair.  He is in favor of watchful waiting approach. Signs/symptoms to call or return for were reviewed and pt expressed understanding. Continue senakot S 2 tabs bid to try to minimize constipation and it's potential effect on his hernia.  2) Solitary R lobe thyroid nodule: growth over a 23 month period was 2 mm x 40mm (and this measurement was done about 6 mo ago). The u/s was done via his employer/health fair, so no ultrasonography characteristics are commented on in the report--just size. Will refer to endocrinologist for further evaluation. He has been euthyroid since the nodule was discovered in 2013.  Spent 25 min with pt today, with >50% of this time spent in counseling and care coordination regarding the  above problems.  An After Visit Summary was printed and given to the patient.  FOLLOW UP: Return if symptoms worsen or fail to improve.

## 2015-01-18 ENCOUNTER — Telehealth: Payer: Self-pay | Admitting: Internal Medicine

## 2015-01-18 NOTE — Telephone Encounter (Signed)
Spoke with patient and he is having a change in bowels- constipation. He is using Senekot 2 in AM and PM. He is still having some problems. He states his wife is undergoing chemo treatments and this is a stressful situation. He will increase fluids and understands he can try Miralax. He is asking for OV to "just get checked out." Scheduled on  02/28/15 with Dr. Carlean Purl.

## 2015-01-19 ENCOUNTER — Encounter: Payer: Self-pay | Admitting: Internal Medicine

## 2015-01-19 ENCOUNTER — Ambulatory Visit (INDEPENDENT_AMBULATORY_CARE_PROVIDER_SITE_OTHER): Payer: BLUE CROSS/BLUE SHIELD | Admitting: Internal Medicine

## 2015-01-19 VITALS — BP 112/68 | HR 73 | Temp 97.9°F | Resp 12 | Ht 70.75 in | Wt 221.8 lb

## 2015-01-19 DIAGNOSIS — E041 Nontoxic single thyroid nodule: Secondary | ICD-10-CM | POA: Diagnosis not present

## 2015-01-19 NOTE — Progress Notes (Addendum)
Patient ID: Chisum Habenicht, male   DOB: May 19, 1961, 54 y.o.   MRN: [id number]   HPI  Jarryd Gratz is a 54 y.o.-year-old male, referred by his PCP, Dr.McGowen, for evaluation for Thyroid nodule.  Patient had a carotid ultrasound screening at the health fair at work that showed a thyroid nodule in 2013. A dedicated thyroid ultrasound performed subsequently showed the right thyroid nodule, of 1.4 cm. A repeat carotid ultrasound obtained at the health fair at work in 2015 showed a possible increase in the size of the nodule, to 1.7 cm.  Reviewed imaging studies:  Carotid U/S (05/2014): R 1.4 x 1.7 cm (records not available for this)  Thyroid U/S (05/27/2012): R thyroid nodule, 1.2 x 1.4 cm  He had a Thryoid Uptake and scan (06/16/2012): normal.  I reviewed pt's thyroid tests: Lab Results  Component Value Date   TSH 1.05 07/03/2014   TSH 0.89 06/04/2012   TSH 1.33 06/12/2011   FREET4 0.80 06/04/2012    Pt denies feeling nodules in neck, + hoarseness - gets raspy voice after talks more, no dysphagia/odynophagia, no SOB with lying down.  Pt c/o: - + fatigue >> takes L-Arginine and this helps - no heat intolerance/cold intolerance - no tremors - no palpitations - no anxiety/depression - no hyperdefecation/+ constipation - + weight loss - + weight gain - no dry skin - no hair falling - no problems with concentration  Pt does have a FH of thyroid ds.in mother. No FH of thyroid cancer. No h/o radiation tx to head or neck.  No seaweed or kelp, no recent contrast studies. No recent steroid use. No herbal supplements.   I reviewed his chart and he also has a history of MS - dx 1992, stable, back pain, hernia, deviated septum, OSA  - wears a CPAP.   He usually exercises at 5 am, not recently. Wife is undergoing cancer treatment.  ROS: Constitutional: no weight gain/loss, + fatigue, no subjective hyperthermia/hypothermia Eyes: no blurry vision, no xerophthalmia ENT: no sore throat, no  nodules palpated in throat, no dysphagia/odynophagia, + hoarseness Cardiovascular: no CP/SOB/palpitations/leg swelling Respiratory: no cough/SOB Gastrointestinal: no N/V/D/+ C Musculoskeletal: no muscle/joint aches Skin: no rashes Neurological: no tremors/numbness/tingling/dizziness Psychiatric: no depression/anxiety  Past Medical History  Diagnosis Date  . Multiple sclerosis 1992    Remission since 1996 on no meds.  Initial presentation was gait/balance and leg weakness sx's.  . Allergic rhinitis     Much improved with allergy immunotherapy (molds primarily)  . Asthma     no inhaler requirement since allergies under control with immunotherapy  . PUD (peptic ulcer disease)     Bleeding ulcer 1978  . BPH (benign prostatic hypertrophy)     Rapaflo helps.  PSA's have been normal.  Cysto 06/2012 confirmed trilobed BPH--plan made for TURP procedure  . OSA on CPAP 2005  . Chronic constipation   . Personal history of colonic polyps 03/24/2012    03/2012 - 5 mm cecal adenoma  . Thyroid nodule 05/2012    Right lobe 46mmX14mm nodule found on screening thyroid and carotid u/s done through his employer.  Pt was euthyroid at that time.  A radioactive iodine uptake and scan was normal 06/2012.  Marland Kitchen Chronic kidney disease     urinary frequency  . Cancer     skin cancer   . Constipation, chronic 11/24/2011  . Chronic sinusitis     Deviated nasal septum: >90% airway obstruction on right.   Past Surgical History  Procedure Laterality  Date  . Femur fracture surgery  1978    with rod insertion.  Rod removal 1979  . Appendectomy  1986  . Colonoscopy  2006, 2013     diminutive hyperplastic cecal polyp ([place]); diminutive cecal adenoma removed 2013, recall 2018  . Transurethral resection of prostate N/A 08/25/2012    Procedure: TRANSURETHRAL RESECTION OF THE PROSTATE WITH GYRUS INSTRUMENTS;  Surgeon: Alexis Frock, MD;  Location: [facility];  Service: Urology;  Laterality: N/A;--patient got excellent  results from procedure.   History   Social History  . Marital Status: Married    Spouse Name: N/A  . Number of Children: 1   Occupational History  .  [employer]   Social History Main Topics  . Smoking status: Never Smoker   . Smokeless tobacco: Never Used  . Alcohol Use: 0.0 oz/week     Comment: occasional wine   . Drug Use: No   Social History Narrative   Married, has one 51 y/o son.   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   JD and MBA from [employer].   No tobacco, 3-4 glasses of red wine per week, no drug use.   No exercise.   Current Outpatient Prescriptions on File Prior to Visit  Medication Sig Dispense Refill  . albuterol (PROVENTIL HFA;VENTOLIN HFA) 108 (90 BASE) MCG/ACT inhaler Inhale 2 puffs into the lungs every 4 (four) hours as needed for shortness of breath. 2 Inhaler 1  . Arginine 1000 MG TABS Take 1-2 tablets by mouth daily.     . fluticasone (FLONASE) 50 MCG/ACT nasal spray 2 sprays each nostril once each morning 48 g 3  . loratadine-pseudoephedrine (CLARITIN-D 12-HOUR) 5-120 MG per tablet Take 1 tablet by mouth 2 (two) times daily. 60 tablet 11  . Omega-3 Fatty Acids (FISH OIL) 1000 MG CAPS Take by mouth.    . Phenylephrine-Acetaminophen (SINUS CONGESTION/PAIN DAYTIME PO) Take 1 tablet by mouth daily as needed (for allergies.).     Marland Kitchen psyllium (HYDROCIL/METAMUCIL) 95 % PACK Take 1 packet by mouth daily as needed (if no relief from Senokot.).     Marland Kitchen vitamin C (ASCORBIC ACID) 500 MG tablet Take 500 mg by mouth daily.      No current facility-administered medications on file prior to visit.   No Known Allergies Family History  Problem Relation Age of Onset  . Breast cancer Mother   . Parkinsonism Father   . Stroke Father   . Colon polyps Father 48   PE: BP 112/68 mmHg  Pulse 73  Temp(Src) 97.9 F (36.6 C) (Oral)  Resp 12  Ht 5' 10.75" (1.797 m)  Wt 221 lb 12.8 oz (100.608 kg)  BMI 31.16 kg/m2   SpO2 97% Wt Readings from Last 3 Encounters:  01/19/15 221 lb 12.8 oz (100.608 kg)  11/22/14 217 lb (98.431 kg)  07/03/14 228 lb (103.42 kg)   Constitutional: overweight, in NAD Eyes: PERRLA, EOMI, no exophthalmos ENT: moist mucous membranes, no thyromegaly, + right sided thyroid nodule approximately 1 cm palpated in the lower neck. Fullness in the right lower neck. No cervical lymphadenopathy Cardiovascular: RRR, No MRG Respiratory: CTA B Gastrointestinal: abdomen soft, NT, ND, BS+ Musculoskeletal: no deformities, strength intact in all 4;  Skin: moist, warm, no rashes Neurological: no tremor with outstretched hands, DTR normal in all 4  ASSESSMENT: 1. Thyroid nodule  PLAN: 1. Thyroid not - I reviewed the images and the report of his thyroid ultrasounds  along with the patient. I pointed out that the thyroid nodule meets the criteria for for biopsy based on the latest ultrasound, however, this was a carotid imaging study and we would need a dedicated thyroid ultrasound to be able to better evaluate the dimensions of the nodule. He agrees to get a new ultrasound, which I ordered today. -  If the nodule is less than 1.5 cm and does not have worrisome features, we can just follow it for now and repeat another ultrasound in 1-2 years. However, if the nodule is 1.5 cm or more, I would suggest biopsy. I explained that, ultimately, the only way that we can tell exactly if it is cancer or not is by doing the thyroid biopsy. I explained what the test entails and I gave him written material about this. He is very anxious about potential biopsy, and I agreed to give him a small dose of benzodiazepine if we need to do this. He will also have somebody to drive him to and from the appointment in this case. - Pt does not have a thyroid cancer family history or a personal history of RxTx to head/neck. All these would favor benignity. - I explained that this is not cancer, we can continue to follow him on a  yearly basis, and check another ultrasound in another year or 2. - he should let me know if he develops neck compression symptoms, in that case, we might need to do either lobectomy or thyroidectomy - No need to repeat his thyroid tests, as they were repeatedly normal over time, last check in 06/2014. - I'll see him back in a year, assuming her FNA is normal. If FNA abnormal, we will meet sooner.  - I advised pt to join my chart and I will send him the results through there   01/29/2015 - Thyroid U/S: CLINICAL DATA: Follow-up thyroid nodules.  EXAM: THYROID ULTRASOUND  TECHNIQUE: Ultrasound examination of the thyroid gland and adjacent soft tissues was performed.  COMPARISON: None.  FINDINGS: Right thyroid lobe  Measurements: 4.8 x 2.9 x 2.0 cm. 2.5 x 1.5 x 2.6 cm complex nodule lower pole right thyroid. By history the patient had a 12 x 14 mm right thyroid lobe nodule on a prior outside scan of 2013.  Left thyroid lobe  Measurements: 3.4 x 1.5 x 1.8 cm. No nodules visualized.  Isthmus  Thickness: 0.6 cm. No nodules visualized.  Lymphadenopathy  None visualized.  IMPRESSION: 2.5 x 1.5 x 2.6 cm complex nodule lower pole right lobe of the thyroid . Findings meet consensus criteria for biopsy. Ultrasound-guided fine needle aspiration should be considered, as per the consensus statement: Management of Thyroid Nodules Detected at [place]: Society of Radiologists in [place]. Radiology 2005; [id number].   Electronically Signed By: Marcello Moores Register On: 01/29/2015 17:14  Right sided thyroid nodule appears to have doubled in size. This is concerning. Will order a thyroid biopsy. Will call in Ativan tablets to help him go through the procedure.  FNA R thyroid nodule (04/17/15): Adequacy Reason Satisfactory For Evaluation. Diagnosis THYROID, FINE NEEDLE ASPIRATION RLP (SPECIMEN 1 OF 1, COLLECTED ON 04/17/2015): BENIGN. ([place]  CATEGORY II). NUMEROUS MACROPHAGES AND SCANT FOLLICULAR EPITHELIUM CONSISTENT WITH CYST. Claudette Laws MD Pathologist, Electronic Signature (Case signed 04/19/2015) Specimen Clinical Information 2.5 x 1.5 x 2.6 cm complex nodule lower pole right lobe of the thyroid. Findings meet consensus criteria for biopsy. Source Thyroid, Fine Needle Aspiration, Right Lobe RLP (Specimen 1 of 1, collected on 04/17/15)  Great news!

## 2015-01-19 NOTE — Patient Instructions (Signed)
Please return in 1 year.  Please stop in [facility] to schedule the neck ultrasound.  Thyroid Biopsy The thyroid gland is a butterfly-shaped gland situated in the front of the neck. It produces hormones which affect metabolism, growth and development, and body temperature. A thyroid biopsy is a procedure in which small samples of tissue or fluid are removed from the thyroid gland or mass and examined under a microscope. This test is done to determine the cause of thyroid problems, such as infection, cancer, or other thyroid problems. There are 2 ways to obtain samples: 1. Fine needle biopsy. Samples are removed using a thin needle inserted through the skin and into the thyroid gland or mass. 2. Open biopsy. Samples are removed after a cut (incision) is made through the skin. LET YOUR CAREGIVER KNOW ABOUT:   Allergies.  Medications taken including herbs, eye drops, over-the-counter medications, and creams.  Use of steroids (by mouth or creams).  Previous problems with anesthetics or numbing medicine.  Possibility of pregnancy, if this applies.  History of blood clots (thrombophlebitis).  History of bleeding or blood problems.  Previous surgery.  Other health problems. RISKS AND COMPLICATIONS  Bleeding from the site. The risk of bleeding is higher if you have a bleeding disorder or are taking any blood thinning medications (anticoagulants).  Infection.  Injury to structures near the thyroid gland. BEFORE THE PROCEDURE  This is a procedure that can be done as an outpatient. Confirm the time that you need to arrive for your procedure. Confirm whether there is a need to fast or withhold any medications. A blood sample may be done to determine your blood clotting time. Medicine may be given to help you relax (sedative). PROCEDURE Fine needle biopsy. You will be awake during the procedure. You may be asked to lie on your back with your head tipped backward to extend your neck. Let  your caregiver know if you cannot tolerate the positioning. An area on your neck will be cleansed. A needle is inserted through the skin of your neck. You may feel a mild discomfort during this procedure. You may be asked to avoid coughing, talking, swallowing, or making sounds during some portions of the procedure. The needle is withdrawn once tissue or fluid samples have been removed. Pressure may be applied to the neck to reduce swelling and ensure that bleeding has stopped. The samples will be sent for examination.  Open biopsy. You will be given general anesthesia. You will be asleep during the procedure. An incision is made in your neck. A sample of thyroid tissue or the mass is removed. The tissue sample or mass will be sent for examination. The sample or mass may be examined during the biopsy. If the sample or mass contains cancer cells, some or all of the thyroid gland may be removed. The incision is closed with stitches. AFTER THE PROCEDURE  Your recovery will be assessed and monitored. If there are no problems, as an outpatient, you should be able to go home shortly after the procedure. If you had a fine needle biopsy:  You may have soreness at the biopsy site for 1 to 2 days. If you had an open biopsy:   You may have soreness at the biopsy site for 3 to 4 days.  You may have a hoarse voice or sore throat for 1 to 2 days. Obtaining the Test Results It is your responsibility to obtain your test results. Do not assume everything is normal if you have  not heard from your caregiver or the medical facility. It is important for you to follow up on all of your test results. HOME CARE INSTRUCTIONS   Keeping your head raised on a pillow when you are lying down may ease biopsy site discomfort.  Supporting the back of your head and neck with both hands as you sit up from a lying position may ease biopsy site discomfort.  Only take over-the-counter or prescription medicines for pain, discomfort,  or fever as directed by your caregiver.  Throat lozenges or gargling with warm salt water may help to soothe a sore throat. SEEK IMMEDIATE MEDICAL CARE IF:   You have severe bleeding from the biopsy site.  You have difficulty swallowing.  You have a fever.  You have increased pain, swelling, redness, or warmth at the biopsy site.  You notice pus coming from the biopsy site.  You have swollen glands (lymph nodes) in your neck. Document Released: 04/27/2007 Document Revised: 10/25/2012 Document Reviewed: 09/22/2013 [facility] Patient Information 2015 [place], [place]. This information is not intended to replace advice given to you by your health care provider. Make sure you discuss any questions you have with your health care provider.

## 2015-01-21 ENCOUNTER — Encounter: Payer: Self-pay | Admitting: Family Medicine

## 2015-01-23 ENCOUNTER — Other Ambulatory Visit: Payer: BLUE CROSS/BLUE SHIELD

## 2015-01-29 ENCOUNTER — Ambulatory Visit
Admission: RE | Admit: 2015-01-29 | Discharge: 2015-01-29 | Disposition: A | Discharge status: Home / Self Care | Payer: BLUE CROSS/BLUE SHIELD | Source: Ambulatory Visit | Attending: Internal Medicine | Admitting: Internal Medicine

## 2015-01-30 MED ORDER — LORAZEPAM 0.5 MG PO TABS: ORAL_TABLET | ORAL | Status: DC

## 2015-01-30 NOTE — Addendum Note (Signed)
Addended by: Philemon Kingdom on: 01/30/2015 07:39 AM   Modules accepted: Orders, Level of Service

## 2015-02-22 ENCOUNTER — Other Ambulatory Visit: Payer: BLUE CROSS/BLUE SHIELD

## 2015-02-28 ENCOUNTER — Ambulatory Visit (INDEPENDENT_AMBULATORY_CARE_PROVIDER_SITE_OTHER): Payer: BLUE CROSS/BLUE SHIELD | Admitting: Internal Medicine

## 2015-02-28 ENCOUNTER — Other Ambulatory Visit (INDEPENDENT_AMBULATORY_CARE_PROVIDER_SITE_OTHER): Payer: BLUE CROSS/BLUE SHIELD

## 2015-02-28 ENCOUNTER — Encounter: Payer: Self-pay | Admitting: Internal Medicine

## 2015-02-28 VITALS — BP 140/80 | HR 62 | Ht 69.25 in | Wt 224.1 lb

## 2015-02-28 DIAGNOSIS — D649 Anemia, unspecified: Secondary | ICD-10-CM | POA: Diagnosis not present

## 2015-02-28 DIAGNOSIS — R1030 Lower abdominal pain, unspecified: Secondary | ICD-10-CM

## 2015-02-28 DIAGNOSIS — K4090 Unilateral inguinal hernia, without obstruction or gangrene, not specified as recurrent: Secondary | ICD-10-CM

## 2015-02-28 DIAGNOSIS — K5900 Constipation, unspecified: Secondary | ICD-10-CM | POA: Diagnosis not present

## 2015-02-28 DIAGNOSIS — R1031 Right lower quadrant pain: Secondary | ICD-10-CM

## 2015-02-28 DIAGNOSIS — K5909 Other constipation: Secondary | ICD-10-CM

## 2015-02-28 LAB — URINALYSIS, ROUTINE W REFLEX MICROSCOPIC
Bilirubin Urine: NEGATIVE
HGB URINE DIPSTICK: NEGATIVE
Ketones, ur: NEGATIVE
LEUKOCYTES UA: NEGATIVE
NITRITE: NEGATIVE
RBC / HPF: NONE SEEN (ref 0–?)
Total Protein, Urine: NEGATIVE
URINE GLUCOSE: NEGATIVE
Urobilinogen, UA: 0.2 (ref 0.0–1.0)
WBC, UA: NONE SEEN (ref 0–?)
pH: 6.5 (ref 5.0–8.0)

## 2015-02-28 LAB — CBC WITH DIFFERENTIAL/PLATELET
Basophils Absolute: 0 10*3/uL (ref 0.0–0.1)
Basophils Relative: 0.3 % (ref 0.0–3.0)
EOS PCT: 1.7 % (ref 0.0–5.0)
Eosinophils Absolute: 0.1 10*3/uL (ref 0.0–0.7)
HCT: 44.4 % (ref 39.0–52.0)
Hemoglobin: 15 g/dL (ref 13.0–17.0)
LYMPHS ABS: 2 10*3/uL (ref 0.7–4.0)
Lymphocytes Relative: 36.3 % (ref 12.0–46.0)
MCHC: 33.8 g/dL (ref 30.0–36.0)
MCV: 90.7 fl (ref 78.0–100.0)
MONO ABS: 0.6 10*3/uL (ref 0.1–1.0)
Monocytes Relative: 10.8 % (ref 3.0–12.0)
NEUTROS ABS: 2.8 10*3/uL (ref 1.4–7.7)
NEUTROS PCT: 50.9 % (ref 43.0–77.0)
PLATELETS: 253 10*3/uL (ref 150.0–400.0)
RBC: 4.9 Mil/uL (ref 4.22–5.81)
RDW: 13.6 % (ref 11.5–15.5)
WBC: 5.6 10*3/uL (ref 4.0–10.5)

## 2015-02-28 LAB — COMPREHENSIVE METABOLIC PANEL
ALK PHOS: 55 U/L (ref 39–117)
ALT: 32 U/L (ref 0–53)
AST: 20 U/L (ref 0–37)
Albumin: 4.3 g/dL (ref 3.5–5.2)
BILIRUBIN TOTAL: 0.4 mg/dL (ref 0.2–1.2)
BUN: 14 mg/dL (ref 6–23)
CO2: 30 meq/L (ref 19–32)
Calcium: 9.3 mg/dL (ref 8.4–10.5)
Chloride: 104 mEq/L (ref 96–112)
Creatinine, Ser: 1.02 mg/dL (ref 0.40–1.50)
GFR: 80.8 mL/min (ref >60.00)
GLUCOSE: 97 mg/dL (ref 70–99)
POTASSIUM: 4.3 meq/L (ref 3.5–5.1)
SODIUM: 139 meq/L (ref 135–145)
TOTAL PROTEIN: 7.2 g/dL (ref 6.0–8.3)

## 2015-02-28 NOTE — Patient Instructions (Signed)
Please purchase and take Miralax daily, coupon provided.   Your physician has requested that you go to the basement for the following lab work before leaving today: CBC, CMET, U/A   I appreciate the opportunity to care for you. Silvano Rusk, MD, [facility]

## 2015-02-28 NOTE — Progress Notes (Signed)
   Subjective:    Patient ID: Brian Walsh, male    DOB: 05/05/1961, 54 y.o.   MRN: [id number] Cc: back and inguinal pain w/ constipation HPI Having intermittent worsening constipation and assoc back pain. Also a right groin pain. Wife has colon cancer and he has been helping with appts etc so off NL schedule. Carried a chair upstairs then the the right groin pain started. No stool caliber change or bleeding.  Medications, allergies, past medical history, past surgical history, family history and social history are reviewed and updated in the EMR.  Review of Systems Allergies/sinus sxs O/w ok    Objective:   Physical Exam BP 140/80 mmHg  Pulse 62  Ht 5' 9.25" (1.759 m)  Wt 224 lb 2 oz (101.662 kg)  BMI 32.86 kg/m2 NAD, WDWN abd soft and nt no mass, no hernia GU  uncirc bilat desc testes Small right indirect inguinal hernia palpable w/ cough and w/ finger in scrotal area  Rectal   Anoderm inspection revealed no abnormalities Anal wink was absent Digital exam revealed normal resting tone and voluntary squeeze. No mass or rectocele present. Simulated defecation with valsalva revealed appropriate abdominal contraction and descent.       Assessment & Plan:  Right groin pain  Right inguinal hernia  Chronic constipation  Mild chronic anemia   CBC, CMET UA Daily MiraLAx Observe hernia - advised re: what to watch for, handout given

## 2015-02-28 NOTE — Progress Notes (Signed)
Quick Note:  Labs all fine - My Chart message ______

## 2015-03-02 ENCOUNTER — Encounter: Payer: Self-pay | Admitting: Internal Medicine

## 2015-03-15 ENCOUNTER — Inpatient Hospital Stay: Admission: RE | Admit: 2015-03-15 | Payer: BLUE CROSS/BLUE SHIELD | Source: Ambulatory Visit

## 2015-04-04 ENCOUNTER — Encounter: Payer: Self-pay | Admitting: Family Medicine

## 2015-04-04 ENCOUNTER — Ambulatory Visit (INDEPENDENT_AMBULATORY_CARE_PROVIDER_SITE_OTHER): Payer: BLUE CROSS/BLUE SHIELD | Admitting: Family Medicine

## 2015-04-04 VITALS — BP 136/89 | HR 54 | Temp 98.1°F | Resp 16 | Ht 69.25 in | Wt 224.0 lb

## 2015-04-04 DIAGNOSIS — J0101 Acute recurrent maxillary sinusitis: Secondary | ICD-10-CM | POA: Diagnosis not present

## 2015-04-04 MED ORDER — AMOXICILLIN-POT CLAVULANATE 875-125 MG PO TABS: 1.0000 | ORAL_TABLET | Freq: Two times a day (BID) | ORAL | Status: DC

## 2015-04-04 NOTE — Progress Notes (Signed)
Pre visit review using our clinic review tool, if applicable. No additional management support is needed unless otherwise documented below in the visit note. 

## 2015-04-04 NOTE — Progress Notes (Signed)
OFFICE VISIT  04/04/2015   CC:  Chief Complaint  Patient presents with  . URI    x 4-5 days   HPI:    Patient is a 54 y.o. Caucasian male who presents for 4-5 days of acutely worsened chronic rhinitis, mucous was thin and clear and now thick and green, no pain in face and no ST.  Energy ok.  No signif cough.  Mild ST yesterday. Consistently uses saline nasal rinses.  Some ear fullness but no pain.   He has seen an ENT in the past locally and there was talk of sinus surgery in future but patient has a lot of medical things on his plate now and his wife has been ill as well.  Past Medical History  Diagnosis Date  . Multiple sclerosis 1992    Remission since 1996 on no meds.  Initial presentation was gait/balance and leg weakness sx's.  . Allergic rhinitis     Much improved with allergy immunotherapy (molds primarily)  . Asthma     no inhaler requirement since allergies under control with immunotherapy  . PUD (peptic ulcer disease)     Bleeding ulcer 1978  . BPH (benign prostatic hypertrophy)     Rapaflo helps.  PSA's have been normal.  Cysto 06/2012 confirmed trilobed BPH--plan made for TURP procedure  . OSA on CPAP 2005  . Chronic constipation   . Personal history of colonic polyps 03/24/2012    03/2012 - 5 mm cecal adenoma  . Thyroid nodule 05/2012    Right lobe 39mmX14mm nodule found on screening thyroid and carotid u/s done through his employer.  Pt was euthyroid at that time.  A radioactive iodine uptake and scan was normal 06/2012.    Marland Kitchen Chronic kidney disease     urinary frequency  . Cancer     skin cancer   . Constipation, chronic 11/24/2011  . Chronic sinusitis     Deviated nasal septum: >90% airway obstruction on right.  . Colon polyps     Past Surgical History  Procedure Laterality Date  . Femur fracture surgery  1978    with rod insertion.  Rod removal 1979  . Appendectomy  1986  . Colonoscopy  2006, 2013     diminutive hyperplastic cecal polyp ([place]);  diminutive cecal adenoma removed 2013, recall 2018  . Transurethral resection of prostate N/A 08/25/2012    Procedure: TRANSURETHRAL RESECTION OF THE PROSTATE WITH GYRUS INSTRUMENTS;  Surgeon: Alexis Frock, MD;  Location: [facility];  Service: Urology;  Laterality: N/A;--patient got excellent results from procedure.    Outpatient Prescriptions Prior to Visit  Medication Sig Dispense Refill  . albuterol (PROVENTIL HFA;VENTOLIN HFA) 108 (90 BASE) MCG/ACT inhaler Inhale 2 puffs into the lungs every 4 (four) hours as needed for shortness of breath. 2 Inhaler 1  . fluticasone (FLONASE) 50 MCG/ACT nasal spray 2 sprays each nostril once each morning 48 g 3  . loratadine-pseudoephedrine (CLARITIN-D 12-HOUR) 5-120 MG per tablet Take 1 tablet by mouth 2 (two) times daily. 60 tablet 11  . LORazepam (ATIVAN) 0.5 MG tablet Take 1-2 tablets as needed before the thyroid biopsy 4 tablet 0  . Omega-3 Fatty Acids (FISH OIL) 1000 MG CAPS Take by mouth 2 (two) times daily.     Marland Kitchen Phenylephrine-Acetaminophen (SINUS CONGESTION/PAIN DAYTIME PO) Take 1 tablet by mouth daily as needed (for allergies.).     Marland Kitchen psyllium (HYDROCIL/METAMUCIL) 95 % PACK Take 1 packet by mouth daily as needed (if no relief from Senokot.).     Marland Kitchen  senna (SENOKOT) 8.6 MG tablet Take 2 tablets by mouth 2 (two) times daily as needed for constipation.    . vitamin C (ASCORBIC ACID) 500 MG tablet Take 500 mg by mouth as needed.      No facility-administered medications prior to visit.    No Known Allergies  ROS As per HPI  PE: Blood pressure 136/89, pulse 54, temperature 98.1 F (36.7 C), temperature source Oral, resp. rate 16, height 5' 9.25" (1.759 m), weight 224 lb (101.606 kg), SpO2 99 %. VS: noted--normal. Gen: alert, NAD, NONTOXIC APPEARING. HEENT: eyes without injection, drainage, or swelling.  Ears: EACs clear, TMs with normal light reflex and landmarks.  Nose: Clear rhinorrhea, with some dried, crusty exudate adherent to mildly injected  mucosa.  No purulent d/c.  No paranasal sinus TTP.  No facial swelling.  Throat and mouth without focal lesion.  No pharyngial swelling, erythema, or exudate.   Neck: supple, no LAD.   LUNGS: CTA bilat, nonlabored resps.   CV: RRR, no m/r/g. EXT: no c/c/e SKIN: no rash  LABS:  none  IMPRESSION AND PLAN:  Acute sinusitis superimposed on chronic rhinitis. He has hx of recurrent sinusitis that ENT is prepared to do sinus surgery for in the future per pt report. Augmentin 875mg  bid x 10d rx'd today. Continue all other current symptomatic care. Signs/symptoms to call or return for were reviewed and pt expressed understanding.  An After Visit Summary was printed and given to the patient.  FOLLOW UP: Return if symptoms worsen or fail to improve.

## 2015-04-05 ENCOUNTER — Ambulatory Visit: Payer: BLUE CROSS/BLUE SHIELD | Admitting: Family Medicine

## 2015-04-17 ENCOUNTER — Other Ambulatory Visit (HOSPITAL_COMMUNITY)
Admission: RE | Admit: 2015-04-17 | Discharge: 2015-04-17 | Disposition: A | Discharge status: Home / Self Care | Payer: BLUE CROSS/BLUE SHIELD | Source: Ambulatory Visit | Attending: Interventional Radiology | Admitting: Interventional Radiology

## 2015-04-17 ENCOUNTER — Ambulatory Visit
Admission: RE | Admit: 2015-04-17 | Discharge: 2015-04-17 | Disposition: A | Discharge status: Home / Self Care | Payer: BLUE CROSS/BLUE SHIELD | Source: Ambulatory Visit | Attending: Internal Medicine | Admitting: Internal Medicine

## 2015-04-17 DIAGNOSIS — E041 Nontoxic single thyroid nodule: Secondary | ICD-10-CM | POA: Diagnosis not present

## 2015-04-17 HISTORY — PX: OTHER SURGICAL HISTORY: SHX169

## 2015-05-10 ENCOUNTER — Encounter: Payer: Self-pay | Admitting: Family Medicine

## 2015-05-18 ENCOUNTER — Telehealth: Payer: Self-pay | Admitting: *Deleted

## 2015-05-18 NOTE — Telephone Encounter (Signed)
Pt LMOM on 05/18/15 at 10:36am requesting a Rx for new CPAP machine with tubing and mask. He stated that his CPAP machine died last night.  I spoke with pt to find out his settings. He was unable to get the settings from the CPAP machine but did have a copy of the sleep study that was done in 2005 which stated that the settings was 9cm of water pressure with comfort pressure of 3cm. He stated that he would like to get this Rx today. I advised pt that Dr. Anitra Lauth is seeing pts this morning and this afternoon and that we will call him once Dr. Anitra Lauth has replied to the message being sent back. Pt voiced understanding.  Please advise.Thanks.

## 2015-05-18 NOTE — Telephone Encounter (Signed)
Pt advised and voiced understanding.  Rx put up front for p/u.  

## 2015-05-18 NOTE — Telephone Encounter (Signed)
Rx written.

## 2015-06-14 ENCOUNTER — Other Ambulatory Visit: Payer: Self-pay | Admitting: Family Medicine

## 2015-06-14 MED ORDER — FLUTICASONE PROPIONATE 50 MCG/ACT NA SUSP: NASAL | Status: DC

## 2015-06-14 MED ORDER — LORATADINE-PSEUDOEPHEDRINE ER 5-120 MG PO TB12: 1.0000 | ORAL_TABLET | Freq: Two times a day (BID) | ORAL | Status: DC

## 2015-09-12 ENCOUNTER — Ambulatory Visit (INDEPENDENT_AMBULATORY_CARE_PROVIDER_SITE_OTHER): Payer: BLUE CROSS/BLUE SHIELD | Admitting: Family Medicine

## 2015-09-12 ENCOUNTER — Encounter: Payer: Self-pay | Admitting: Family Medicine

## 2015-09-12 VITALS — BP 137/90 | HR 66 | Temp 98.3°F | Resp 16 | Ht 69.25 in | Wt 225.8 lb

## 2015-09-12 DIAGNOSIS — K4090 Unilateral inguinal hernia, without obstruction or gangrene, not specified as recurrent: Secondary | ICD-10-CM | POA: Diagnosis not present

## 2015-09-12 NOTE — Progress Notes (Signed)
OFFICE VISIT  09/12/2015   CC:  Chief Complaint  Patient presents with  . Hernia    per pt increased in size   HPI:    Patient is a 55 y.o. Caucasian male who presents for hx of right inguinal hernia that he feels like has increased in size.  He is feeling uncomfortable in the R groin/testicular area, unable to exercise.  Feels a bulge between golf ball and baseball sized on R side.  The bulge does go down when he lies back.  He is eating and drinking fine. Has chronic constipation.  Past Medical History  Diagnosis Date  . Multiple sclerosis ([place]) 1992    Remission since 1996 on no meds.  Initial presentation was gait/balance and leg weakness sx's.  . Allergic rhinitis     Much improved with allergy immunotherapy (molds primarily)  . Asthma     no inhaler requirement since allergies under control with immunotherapy  . PUD (peptic ulcer disease)     Bleeding ulcer 1978  . BPH (benign prostatic hypertrophy)     Rapaflo helps.  PSA's have been normal.  Cysto 06/2012 confirmed trilobed BPH--plan made for TURP procedure  . OSA on CPAP 2005  . Chronic constipation   . Personal history of colonic polyps 03/24/2012    03/2012 - 5 mm cecal adenoma  . Thyroid nodule 05/2012    Right lobe 66mmX14mm nodule found on screening thyroid and carotid u/s done through his employer.  Pt was euthyroid at that time.  A radioactive iodine uptake and scan was normal 06/2012.  FNA bx 04/2015 was BENIGN/CYST.  Marland Kitchen Chronic kidney disease     urinary frequency  . Cancer ([place])     skin cancer   . Constipation, chronic 11/24/2011  . Chronic sinusitis     Deviated nasal septum: >90% airway obstruction on right.  Dr. Wilburn Cornelia discussed possible septoplasty and turbinate reduction with pt in 2015.  Marland Kitchen Colon polyps     Past Surgical History  Procedure Laterality Date  . Femur fracture surgery  1978    with rod insertion.  Rod removal 1979  . Appendectomy  1986  . Colonoscopy  2006, 2013     diminutive  hyperplastic cecal polyp ([place]); diminutive cecal adenoma removed 2013, recall 2018  . Transurethral resection of prostate N/A 08/25/2012    Procedure: TRANSURETHRAL RESECTION OF THE PROSTATE WITH GYRUS INSTRUMENTS;  Surgeon: Alexis Frock, MD;  Location: [facility];  Service: Urology;  Laterality: N/A;--patient got excellent results from procedure.  . Fna r thyroid nodule  04/17/15    Benign/cystic (Dr. Cruzita Lederer)    Outpatient Prescriptions Prior to Visit  Medication Sig Dispense Refill  . albuterol (PROVENTIL HFA;VENTOLIN HFA) 108 (90 BASE) MCG/ACT inhaler Inhale 2 puffs into the lungs every 4 (four) hours as needed for shortness of breath. 2 Inhaler 1  . fluticasone (FLONASE) 50 MCG/ACT nasal spray 2 sprays each nostril once each morning 48 g 2  . loratadine-pseudoephedrine (CLARITIN-D 12-HOUR) 5-120 MG tablet Take 1 tablet by mouth 2 (two) times daily. 60 tablet 2  . Omega-3 Fatty Acids (FISH OIL) 1000 MG CAPS Take by mouth 2 (two) times daily.     Marland Kitchen senna (SENOKOT) 8.6 MG tablet Take 2 tablets by mouth 2 (two) times daily as needed for constipation.    . vitamin C (ASCORBIC ACID) 500 MG tablet Take 500 mg by mouth as needed.     Marland Kitchen amoxicillin-clavulanate (AUGMENTIN) 875-125 MG per tablet Take 1 tablet by  mouth 2 (two) times daily. (Patient not taking: Reported on 09/12/2015) 20 tablet 0  . LORazepam (ATIVAN) 0.5 MG tablet Take 1-2 tablets as needed before the thyroid biopsy (Patient not taking: Reported on 09/12/2015) 4 tablet 0  . Phenylephrine-Acetaminophen (SINUS CONGESTION/PAIN DAYTIME PO) Take 1 tablet by mouth daily as needed (for allergies.). Reported on 09/12/2015    . psyllium (HYDROCIL/METAMUCIL) 95 % PACK Take 1 packet by mouth daily as needed (if no relief from Senokot.). Reported on 09/12/2015     No facility-administered medications prior to visit.    No Known Allergies  ROS As per HPI  PE: Blood pressure 137/90, pulse 66, temperature 98.3 F (36.8 C), temperature source Oral,  resp. rate 16, height 5' 9.25" (1.759 m), weight 225 lb 12 oz (102.4 kg), SpO2 95 %. Gen: Alert, well appearing.  Patient is oriented to person, place, time, and situation. GU: R sided inguinal bulge is noted and this is also barely palpable when I feel indirectly via the scrotum. I can reduce the hernia w/out problem when he lies supine.  No tenderness or erythema to the hernia.  LABS:  none  IMPRESSION AND PLAN:  Enlarging right inguinal hernia.   Some discomfort present now and it is limiting his activity. No sign of incarceration/strangulation. Will refer to gen surg for consideration of hernia repair.  An After Visit Summary was printed and given to the patient.  FOLLOW UP: Return if symptoms worsen or fail to improve.

## 2015-09-12 NOTE — Progress Notes (Signed)
Pre visit review using our clinic review tool, if applicable. No additional management support is needed unless otherwise documented below in the visit note. 

## 2015-09-19 ENCOUNTER — Ambulatory Visit: Payer: Self-pay | Admitting: General Surgery

## 2015-09-19 NOTE — H&P (Signed)
History of Present Illness Brian Spruce MD; 09/19/2015 1:45 PM) Patient words: New-hernia.  The patient is a 55 year old male who presents with an inguinal hernia. Referred by Brian Walsh. He noticed a bulge approximately 1 month ago. It causes pain especially on lifting. Because of this is not been back to the gym in the past year also complicated by other medical issues. He denies any nausea or vomiting. He has had no a previous repairs. He denies any nausea or vomiting.  He does not smoke. He does not have diabetes. He does not have any known collagen deficiency disorders.   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 Albuterol (90MCG/ACT Aerosol Soln, Inhalation) Active. Omega 3 (1000MG  Capsule, Oral) Active. Senna (8.6MG  Tablet, Oral) Active. Vitamin C (500MG  Tablet, Oral) Active. Claritin-D 12 Hour (5-120MG  Tablet ER 12HR, Oral) Active. Medications Reconciled  Social History Brian Walsh, CMA; 09/19/2015 1:22 PM) Alcohol use Moderate alcohol use. Caffeine use Coffee, Tea. No drug use Tobacco use Never smoker.  Family History Brian Walsh, CMA; 09/19/2015 1:22 PM) Breast Cancer Mother, Sister. Cerebrovascular Accident Father. Colon Polyps Father. Melanoma Mother. Migraine Headache Mother. Seizure  disorder Son. Thyroid problems Mother.    Review of Systems Brian Walsh CMA; 09/19/2015 1:22 PM) General Not Present- Appetite Loss, Chills, Fatigue, Fever, Night Sweats, Weight Gain and Weight Loss. Skin Not Present- Change in Wart/Mole, Dryness, Hives, Jaundice, New Lesions, Non-Healing Wounds, Rash and Ulcer. HEENT Present- Seasonal Allergies, Sinus Pain and Wears glasses/contact lenses. Not Present- Earache, Hearing Loss, Hoarseness, Nose Bleed, Oral Ulcers, Ringing in the Ears, Sore Throat, Visual Disturbances and Yellow Eyes. Respiratory Present- Snoring. Not Present- Bloody sputum, Chronic Cough, Difficulty Breathing and Wheezing. Breast Not Present- Breast Mass, Breast Pain, Nipple Discharge and Skin Changes. Cardiovascular Present- Leg Cramps and Swelling of Extremities. Not Present- Chest Pain, Difficulty Breathing Lying Down, Palpitations, Rapid Heart Rate and Shortness of Breath. Gastrointestinal Present- Abdominal Pain, Bloating and Excessive gas. Not Present- Bloody Stool, Change in Bowel Habits, Chronic diarrhea, Constipation, Difficulty Swallowing, Gets full quickly at meals, Hemorrhoids, Indigestion, Nausea, Rectal Pain and Vomiting. Male Genitourinary Not Present- Blood in Urine, Change in Urinary Stream, Frequency, Impotence, Nocturia, Painful Urination, Urgency and Urine Leakage. Musculoskeletal Present- Back Pain, Joint Pain and Joint Stiffness. Not Present- Muscle Pain, Muscle Weakness and Swelling of Extremities. Neurological Not Present- Decreased Memory, Fainting, Headaches, Numbness, Seizures, Tingling, Tremor, Trouble walking and Weakness. Psychiatric Not Present- Anxiety, Bipolar, Change in Sleep Pattern, Depression, Fearful and Frequent crying. Endocrine Not Present- Cold Intolerance, Excessive Hunger, Hair Changes, Heat Intolerance, Hot flashes and New Diabetes. Hematology Not Present- Easy Bruising, Excessive bleeding, Gland problems, HIV and Persistent  Infections.  Vitals (Brian Walsh CMA; 09/19/2015 1:22 PM) 09/19/2015 1:21 PM Weight: 223.8 lb Height: 70in Body Surface Area: 2.19 m Body Mass Index: 32.11 kg/m  Temp.: 98.13F(Oral)  Pulse: 78 (Regular)  BP: 152/92 (Sitting, Left Arm, Standard)       Physical Exam Brian Spruce MD; 09/19/2015 1:45 PM) General  Mental Status-Alert. General Appearance-Cooperative. Orientation-Oriented X4. Build & Nutrition-Obese. Posture-Normal posture.  Integumentary Global Assessment Normal Exam - Head/Face: no rashes, ulcers, lesions or evidence of photo damage. No palpable nodules or masses and Neck: no visible lesions or palpable masses.  Head and Neck Head-normocephalic, atraumatic with no lesions or palpable masses. Face Global Assessment - atraumatic. Thyroid Gland Characteristics - normal size and consistency.  Eye Eyeball - Bilateral-Extraocular movements intact. Sclera/Conjunctiva - Bilateral-No scleral icterus, No Discharge.  ENMT Nose and Sinuses Nose - no deformities observed, no swelling present.  Chest and Lung Exam Palpation Normal exam - Non-tender. Auscultation Breath sounds - Normal.  Cardiovascular Auscultation Rhythm - Regular. Heart Sounds - S1 WNL and S2 WNL. Carotid arteries - No Carotid bruit.  Abdomen Inspection Normal Exam - No Visible peristalsis, No Abnormal pulsations and No Paradoxical movements. Palpation/Percussion Normal exam - Soft, Non Tender, No Rebound tenderness, No Rigidity (guarding), No hepatosplenomegaly and No Palpable abdominal masses. Note: Right palpable hernia sac easily reducible in supine position. No left inguinal defect   Peripheral Vascular Upper Extremity Palpation - Pulses bilaterally normal. Lower Extremity Palpation - Edema - Bilateral - No edema.  Neurologic Neurologic evaluation reveals -normal sensation and normal coordination.  Neuropsychiatric Mental status exam performed  with findings of-able to articulate well with normal speech/language, rate, volume and coherence and thought content normal with ability to perform basic computations and apply abstract reasoning.  Musculoskeletal Normal Exam - Bilateral-Upper Extremity Strength Normal and Lower Extremity Strength Normal.    Assessment & Plan Brian Spruce MD; 09/19/2015 1:46 PM) RIGHT INGUINAL HERNIA (K40.90) Impression: 55 year old male with symptomatic right inguinal hernia. We discussed the etiology of his hernia, the risk of it enlarging, incarceration, obstruction, strangulation, and that it is unlikely to get smaller or better on its own. We discussed operative options of laparoscopic vs open repair with mesh including the risks of recurrence, injury to intestines or abominal organs, chronic pain associated with mesh, ischemic orchiditis or testicular injury. We decided to proceed with laparoscopic right inguinal hernia repair with mesh. Current Plans The anatomy & physiology of the abdominal wall and pelvic floor was discussed. The pathophysiology of hernias in the inguinal and pelvic region was discussed. Natural history risks such as progressive enlargement, pain, incarceration, and strangulation was discussed. Contributors to complications such as smoking, obesity, diabetes, prior surgery, etc were discussed.  I feel the risks of no intervention will lead to serious problems that outweigh the operative risks; therefore, I recommended surgery to reduce and repair the hernia. I explained laparoscopic techniques with possible need for an open approach. I noted usual use of mesh to patch and/or buttress hernia repair  Risks such as bleeding, infection, abscess, need for further treatment, heart attack, death, and other risks were discussed. I noted a good likelihood this will help address the problem. Goals of post-operative recovery were discussed as well. Possibility that this will not correct all  symptoms was explained. I stressed the importance of low-impact activity, aggressive pain control, avoiding constipation, & not pushing through pain to minimize risk of post-operative chronic pain or injury. Possibility of reherniation was discussed. We will work to minimize complications.  An educational handout further explaining the pathology & treatment options was given as well. Questions were answered. The patient expresses understanding & wishes to proceed with surgery.  You are being scheduled for surgery - Our schedulers will call you.  You should hear from our office's scheduling department within 5 working days about the  location, date, and time of surgery. We try to make accommodations for patient's preferences in scheduling surgery, but sometimes the OR schedule or the surgeon's schedule prevents [place] from making those accommodations.  If you have not heard from our office [phone]) in 5 working days, call the office and ask for your surgeon's nurse.  If you have other questions about your diagnosis, plan, or surgery, call the office and ask for your surgeon's nurse.  Pt Education - Pamphlet Given - Laparoscopic Hernia Repair: discussed with patient and provided information.

## 2015-10-17 ENCOUNTER — Encounter (HOSPITAL_BASED_OUTPATIENT_CLINIC_OR_DEPARTMENT_OTHER): Payer: Self-pay | Admitting: *Deleted

## 2015-10-17 NOTE — Progress Notes (Addendum)
To [facility] at 0930-Hg,Ekg  on arrival-Instructed Npo after Mn-Hibiclens shower evening prior and am of procedure -to bring mask from CPAP only.

## 2015-10-22 ENCOUNTER — Ambulatory Visit (HOSPITAL_BASED_OUTPATIENT_CLINIC_OR_DEPARTMENT_OTHER): Payer: BLUE CROSS/BLUE SHIELD | Admitting: Anesthesiology

## 2015-10-22 ENCOUNTER — Other Ambulatory Visit: Payer: Self-pay

## 2015-10-22 ENCOUNTER — Ambulatory Visit (HOSPITAL_BASED_OUTPATIENT_CLINIC_OR_DEPARTMENT_OTHER)
Admission: RE | Admit: 2015-10-22 | Discharge: 2015-10-22 | Disposition: A | Discharge status: Home / Self Care | Payer: BLUE CROSS/BLUE SHIELD | Source: Ambulatory Visit | Attending: General Surgery | Admitting: General Surgery

## 2015-10-22 ENCOUNTER — Encounter (HOSPITAL_BASED_OUTPATIENT_CLINIC_OR_DEPARTMENT_OTHER)
Admission: RE | Disposition: A | Discharge status: Home / Self Care | Payer: Self-pay | Source: Ambulatory Visit | Attending: General Surgery

## 2015-10-22 ENCOUNTER — Encounter (HOSPITAL_BASED_OUTPATIENT_CLINIC_OR_DEPARTMENT_OTHER): Payer: Self-pay | Admitting: *Deleted

## 2015-10-22 DIAGNOSIS — J45909 Unspecified asthma, uncomplicated: Secondary | ICD-10-CM | POA: Insufficient documentation

## 2015-10-22 DIAGNOSIS — K4090 Unilateral inguinal hernia, without obstruction or gangrene, not specified as recurrent: Secondary | ICD-10-CM | POA: Diagnosis present

## 2015-10-22 DIAGNOSIS — G4733 Obstructive sleep apnea (adult) (pediatric): Secondary | ICD-10-CM | POA: Diagnosis not present

## 2015-10-22 DIAGNOSIS — Z9989 Dependence on other enabling machines and devices: Secondary | ICD-10-CM | POA: Diagnosis not present

## 2015-10-22 DIAGNOSIS — G35 Multiple sclerosis: Secondary | ICD-10-CM | POA: Insufficient documentation

## 2015-10-22 DIAGNOSIS — Z7951 Long term (current) use of inhaled steroids: Secondary | ICD-10-CM | POA: Insufficient documentation

## 2015-10-22 DIAGNOSIS — D176 Benign lipomatous neoplasm of spermatic cord: Secondary | ICD-10-CM | POA: Insufficient documentation

## 2015-10-22 DIAGNOSIS — Z79899 Other long term (current) drug therapy: Secondary | ICD-10-CM | POA: Insufficient documentation

## 2015-10-22 HISTORY — PX: INSERTION OF MESH: SHX5868

## 2015-10-22 HISTORY — PX: INGUINAL HERNIA REPAIR: SHX194

## 2015-10-22 LAB — POCT HEMOGLOBIN-HEMACUE: Hemoglobin: 14.8 g/dL (ref 13.0–17.0)

## 2015-10-22 SURGERY — REPAIR, HERNIA, INGUINAL, LAPAROSCOPIC
Anesthesia: General | Laterality: Right

## 2015-10-22 MED ORDER — FENTANYL CITRATE (PF) 100 MCG/2ML IJ SOLN
INTRAMUSCULAR | Status: AC
  Filled 2015-10-22: qty 2

## 2015-10-22 MED ORDER — OXYCODONE-ACETAMINOPHEN 5-325 MG PO TABS: 1.0000 | ORAL_TABLET | ORAL | Status: DC | PRN

## 2015-10-22 MED ORDER — MIDAZOLAM HCL 2 MG/2ML IJ SOLN
INTRAMUSCULAR | Status: AC
  Filled 2015-10-22: qty 2

## 2015-10-22 MED ORDER — FENTANYL CITRATE (PF) 100 MCG/2ML IJ SOLN
INTRAMUSCULAR | Status: DC | PRN
  Administered 2015-10-22 (×8): 25 ug via INTRAVENOUS

## 2015-10-22 MED ORDER — MEPERIDINE HCL 25 MG/ML IJ SOLN
6.2500 mg | INTRAMUSCULAR | Status: DC | PRN
  Filled 2015-10-22: qty 1

## 2015-10-22 MED ORDER — LACTATED RINGERS IV SOLN
INTRAVENOUS | Status: DC
  Administered 2015-10-22 (×2): via INTRAVENOUS
  Filled 2015-10-22: qty 1000

## 2015-10-22 MED ORDER — PROPOFOL 10 MG/ML IV BOLUS
INTRAVENOUS | Status: AC
  Filled 2015-10-22: qty 40

## 2015-10-22 MED ORDER — HYDROMORPHONE HCL 1 MG/ML IJ SOLN
INTRAMUSCULAR | Status: AC
  Filled 2015-10-22: qty 1

## 2015-10-22 MED ORDER — OXYCODONE-ACETAMINOPHEN 5-325 MG PO TABS
1.0000 | ORAL_TABLET | Freq: Once | ORAL | Status: DC
  Filled 2015-10-22: qty 1

## 2015-10-22 MED ORDER — LIDOCAINE HCL (CARDIAC) 20 MG/ML IV SOLN
INTRAVENOUS | Status: AC
  Filled 2015-10-22: qty 5

## 2015-10-22 MED ORDER — MIDAZOLAM HCL 5 MG/5ML IJ SOLN
INTRAMUSCULAR | Status: DC | PRN
  Administered 2015-10-22: 2 mg via INTRAVENOUS

## 2015-10-22 MED ORDER — SUGAMMADEX SODIUM 200 MG/2ML IV SOLN
INTRAVENOUS | Status: DC | PRN
  Administered 2015-10-22: 200 mg via INTRAVENOUS

## 2015-10-22 MED ORDER — SODIUM CHLORIDE 0.9 % IV SOLN
INTRAVENOUS | Status: DC | PRN
  Administered 2015-10-22: 40 mL

## 2015-10-22 MED ORDER — LACTATED RINGERS IV SOLN
INTRAVENOUS | Status: DC
  Filled 2015-10-22: qty 1000

## 2015-10-22 MED ORDER — PROMETHAZINE HCL 25 MG/ML IJ SOLN
6.2500 mg | INTRAMUSCULAR | Status: DC | PRN
  Filled 2015-10-22: qty 1

## 2015-10-22 MED ORDER — SUCCINYLCHOLINE CHLORIDE 20 MG/ML IJ SOLN
INTRAMUSCULAR | Status: DC | PRN
  Administered 2015-10-22: 100 mg via INTRAVENOUS

## 2015-10-22 MED ORDER — CEFAZOLIN SODIUM-DEXTROSE 2-3 GM-% IV SOLR
INTRAVENOUS | Status: AC
Start: 2015-10-22 — End: 2015-10-22
  Filled 2015-10-22: qty 50

## 2015-10-22 MED ORDER — ONDANSETRON HCL 4 MG/2ML IJ SOLN
INTRAMUSCULAR | Status: AC
  Filled 2015-10-22: qty 2

## 2015-10-22 MED ORDER — HYDROMORPHONE HCL 1 MG/ML IJ SOLN
0.2500 mg | INTRAMUSCULAR | Status: DC | PRN
  Administered 2015-10-22: 0.5 mg via INTRAVENOUS
  Filled 2015-10-22: qty 1

## 2015-10-22 MED ORDER — PROPOFOL 10 MG/ML IV BOLUS
INTRAVENOUS | Status: DC | PRN
  Administered 2015-10-22: 200 mg via INTRAVENOUS

## 2015-10-22 MED ORDER — METOCLOPRAMIDE HCL 5 MG/ML IJ SOLN
INTRAMUSCULAR | Status: DC | PRN
  Administered 2015-10-22: 10 mg via INTRAVENOUS

## 2015-10-22 MED ORDER — DEXTROSE 5 % IV SOLN
2.0000 g | INTRAVENOUS | Status: AC
  Administered 2015-10-22: 2 g via INTRAVENOUS
  Filled 2015-10-22: qty 20

## 2015-10-22 MED ORDER — CHLORHEXIDINE GLUCONATE 4 % EX LIQD
1.0000 "application " | Freq: Once | CUTANEOUS | Status: AC
  Administered 2015-10-22: 1 via TOPICAL
  Filled 2015-10-22: qty 15

## 2015-10-22 MED ORDER — BUPIVACAINE HCL (PF) 0.25 % IJ SOLN
INTRAMUSCULAR | Status: DC | PRN
  Administered 2015-10-22: 17 mL

## 2015-10-22 MED ORDER — LIDOCAINE HCL (CARDIAC) 20 MG/ML IV SOLN
INTRAVENOUS | Status: DC | PRN
  Administered 2015-10-22: 80 mg via INTRAVENOUS

## 2015-10-22 MED ORDER — ONDANSETRON HCL 4 MG/2ML IJ SOLN
INTRAMUSCULAR | Status: DC | PRN
  Administered 2015-10-22: 4 mg via INTRAVENOUS

## 2015-10-22 MED ORDER — ROCURONIUM BROMIDE 100 MG/10ML IV SOLN
INTRAVENOUS | Status: DC | PRN
  Administered 2015-10-22: 30 mg via INTRAVENOUS

## 2015-10-22 MED ORDER — DEXAMETHASONE SODIUM PHOSPHATE 4 MG/ML IJ SOLN
INTRAMUSCULAR | Status: DC | PRN
  Administered 2015-10-22: 10 mg via INTRAVENOUS

## 2015-10-22 MED ORDER — CHLORHEXIDINE GLUCONATE 4 % EX LIQD
1.0000 "application " | Freq: Once | CUTANEOUS | Status: DC
  Filled 2015-10-22: qty 15

## 2015-10-22 MED ORDER — SODIUM CHLORIDE 0.9 % IR SOLN
Status: DC | PRN
  Administered 2015-10-22: 1000 mL

## 2015-10-22 SURGICAL SUPPLY — 35 items
BLADE 11 SAFETY STRL DISP (BLADE) ×2 IMPLANT
BLADE CLIPPER SENSICLIP SURGIC (BLADE) ×2 IMPLANT
BNDG GAUZE ELAST 4 BULKY (GAUZE/BANDAGES/DRESSINGS) IMPLANT
CHLORAPREP W/TINT 26ML (MISCELLANEOUS) ×2 IMPLANT
COVER BACK TABLE 60X90IN (DRAPES) ×2 IMPLANT
COVER MAYO STAND STRL (DRAPES) ×2 IMPLANT
DECANTER SPIKE VIAL GLASS SM (MISCELLANEOUS) IMPLANT
DEVICE SECURE STRAP 25 ABSORB (INSTRUMENTS) IMPLANT
DISSECT BALLN SPACEMKR + OVL (BALLOONS) ×2
DISSECTOR BALLN SPACEMKR + OVL (BALLOONS) ×1 IMPLANT
DRAPE LAPAROSCOPIC ABDOMINAL (DRAPES) ×2 IMPLANT
DRAPE UTILITY XL STRL (DRAPES) ×2 IMPLANT
ELECT REM PT RETURN 9FT ADLT (ELECTROSURGICAL) ×2
ELECTRODE REM PT RTRN 9FT ADLT (ELECTROSURGICAL) ×1 IMPLANT
GLOVE BIOGEL PI IND STRL 7.0 (GLOVE) ×1 IMPLANT
GLOVE BIOGEL PI INDICATOR 7.0 (GLOVE) ×1
GLOVE SURG SS PI 7.0 STRL IVOR (GLOVE) ×2 IMPLANT
GOWN STRL REUS W/TWL LRG LVL3 (GOWN DISPOSABLE) ×2 IMPLANT
LIQUID BAND (GAUZE/BANDAGES/DRESSINGS) ×2 IMPLANT
MESH 3DMAX 5X7 RT XLRG (Mesh General) ×2 IMPLANT
NEEDLE HYPO 25X1 1.5 SAFETY (NEEDLE) ×2 IMPLANT
PACK BASIN DAY SURGERY FS (CUSTOM PROCEDURE TRAY) ×2 IMPLANT
PADDING ION DISPOSABLE (MISCELLANEOUS) ×2 IMPLANT
SCISSORS LAP 5X35 DISP (ENDOMECHANICALS) IMPLANT
SET IRRIG TUBING LAPAROSCOPIC (IRRIGATION / IRRIGATOR) ×2 IMPLANT
SHEARS HARMONIC ACE PLUS 36CM (ENDOMECHANICALS) IMPLANT
SOLUTION ANTI FOG 6CC (MISCELLANEOUS) ×2 IMPLANT
SUT MNCRL AB 4-0 PS2 18 (SUTURE) ×2 IMPLANT
SUT VIC AB 2-0 SH 27 (SUTURE)
SUT VIC AB 2-0 SH 27X BRD (SUTURE) IMPLANT
SUT VICRYL 0 UR6 27IN ABS (SUTURE) ×2 IMPLANT
SYR CONTROL 10ML LL (SYRINGE) ×2 IMPLANT
TOWEL OR 17X24 6PK STRL BLUE (TOWEL DISPOSABLE) ×4 IMPLANT
TROCAR CANNULA W/PORT DUAL 5MM (MISCELLANEOUS) ×2 IMPLANT
TUBING INSUF HEATED (TUBING) ×2 IMPLANT

## 2015-10-22 NOTE — Discharge Instructions (Signed)
°  Post Anesthesia Home Care Instructions  Activity: Get plenty of rest for the remainder of the day. A responsible adult should stay with you for 24 hours following the procedure.  For the next 24 hours, DO NOT: -Drive a car -Operate machinery -Drink alcoholic beverages -Take any medication unless instructed by your physician -Make any legal decisions or sign important papers.  Meals: Start with liquid foods such as gelatin or soup. Progress to regular foods as tolerated. Avoid greasy, spicy, heavy foods. If nausea and/or vomiting occur, drink only clear liquids until the nausea and/or vomiting subsides. Call your physician if vomiting continues.  Special Instructions/Symptoms: Your throat may feel dry or sore from the anesthesia or the breathing tube placed in your throat during surgery. If this causes discomfort, gargle with warm salt water. The discomfort should disappear within 24 hours.  If you had a scopolamine patch placed behind your ear for the management of post- operative nausea and/or vomiting:  1. The medication in the patch is effective for 72 hours, after which it should be removed.  Wrap patch in a tissue and discard in the trash. Wash hands thoroughly with soap and water. 2. You may remove the patch earlier than 72 hours if you experience unpleasant side effects which may include dry mouth, dizziness or visual disturbances. 3. Avoid touching the patch. Wash your hands with soap and water after contact with the patch.   Information for Discharge Teaching: EXPAREL (bupivacaine liposome injectable suspension)   Your surgeon gave you EXPAREL(bupivacaine) in your surgical incision to help control your pain after surgery.   EXPAREL is a local anesthetic that provides pain relief by numbing the tissue around the surgical site.  EXPAREL is designed to release pain medication over time and can control pain for up to 72 hours.  Depending on how you respond to EXPAREL, you may  require less pain medication during your recovery.  Possible side effects:  Temporary loss of sensation or ability to move in the area where bupivacaine was injected.  Nausea, vomiting, constipation  Rarely, numbness and tingling in your mouth or lips, lightheadedness, or anxiety may occur.  Call your doctor right away if you think you may be experiencing any of these sensations, or if you have other questions regarding possible side effects.  Follow all other discharge instructions given to you by your surgeon or nurse. Eat a healthy diet and drink plenty of water or other fluids.  If you return to the hospital for any reason within 96 hours following the administration of EXPAREL, please inform your health care providers. 

## 2015-10-22 NOTE — Transfer of Care (Signed)
Immediate Anesthesia Transfer of Care Note  Patient: Brian Walsh  Procedure(s) Performed: Procedure(s): LAPAROSCOPIC RIGHT INGUINAL HERNIA REPAIR (Right) INSERTION OF MESH (Right)  Patient Location: PACU  Anesthesia Type:General  Level of Consciousness: awake, alert  and patient cooperative  Airway & Oxygen Therapy: Patient Spontanous Breathing and Patient connected to face mask oxygen  Post-op Assessment: Report given to RN and Post -op Vital signs reviewed and stable  Post vital signs: Reviewed and stable  Last Vitals:  Filed Vitals:   10/22/15 0942  BP: 153/90  Pulse: 72  Temp: 36.9 C  Resp: 16    Complications: No apparent anesthesia complications

## 2015-10-22 NOTE — H&P (Addendum)
Brian Walsh is an 55 y.o. male.   Chief Complaint: right inguinal hernia HPI: 55 yo male with symptomatic reducible right inguinal hernia. wen. He noticed a bulge approximately 2 month ago. It causes pain especially on lifting. Because of this is not been back to the gym in the past year also complicated by other medical issues. He denies any nausea or vomiting. He has had no a previous repairs. He denies any nausea or vomiting.    Past Medical History  Diagnosis Date  . Multiple sclerosis ([place]) 1992    Remission since 1996 on no meds.  Initial presentation was gait/balance and leg weakness sx's.  . Allergic rhinitis     Much improved with allergy immunotherapy (molds primarily)  . Asthma     no inhaler requirement since allergies under control with immunotherapy  . PUD (peptic ulcer disease)     Bleeding ulcer 1978  . Chronic constipation   . Personal history of colonic polyps 03/24/2012    03/2012 - 5 mm cecal adenoma  . Thyroid nodule 05/2012    Right lobe 32mmX14mm nodule found on screening thyroid and carotid u/s done through his employer.  Pt was euthyroid at that time.  A radioactive iodine uptake and scan was normal 06/2012.  FNA bx 04/2015 was BENIGN/CYST.  Marland Kitchen Cancer ([place])     skin cancer   . Constipation, chronic 11/24/2011  . Chronic sinusitis     Deviated nasal septum: >90% airway obstruction on right.  Dr. Wilburn Cornelia discussed possible septoplasty and turbinate reduction with pt in 2015.  Marland Kitchen Colon polyps   . OSA on CPAP 2005    cpap nightly  . BPH (benign prostatic hypertrophy) 2014    TURP    Past Surgical History  Procedure Laterality Date  . Femur fracture surgery  1978    with rod insertion.  Rod removal 1979  . Appendectomy  1986  . Colonoscopy  2006, 2013     diminutive hyperplastic cecal polyp ([place]); diminutive cecal adenoma removed 2013, recall 2018  . Transurethral resection of prostate N/A 08/25/2012    Procedure: TRANSURETHRAL RESECTION OF THE  PROSTATE WITH GYRUS INSTRUMENTS;  Surgeon: Alexis Frock, MD;  Location: [facility];  Service: Urology;  Laterality: N/A;--patient got excellent results from procedure.  . Fna r thyroid nodule  04/17/15    Benign/cystic (Dr. Cruzita Lederer)    Family History  Problem Relation Age of Onset  . Breast cancer Mother   . Parkinsonism Father   . Stroke Father   . Colon polyps Father 66   Social History:  reports that he has never smoked. He has never used smokeless tobacco. He reports that he drinks alcohol. He reports that he does not use illicit drugs.  Allergies: No Known Allergies  Medications Prior to Admission  Medication Sig Dispense Refill  . albuterol (PROVENTIL HFA;VENTOLIN HFA) 108 (90 BASE) MCG/ACT inhaler Inhale 2 puffs into the lungs every 4 (four) hours as needed for shortness of breath. 2 Inhaler 1  . fluticasone (FLONASE) 50 MCG/ACT nasal spray 2 sprays each nostril once each morning 48 g 2  . loratadine-pseudoephedrine (CLARITIN-D 12-HOUR) 5-120 MG tablet Take 1 tablet by mouth 2 (two) times daily. 60 tablet 2  . Omega-3 Fatty Acids (FISH OIL) 1000 MG CAPS Take by mouth 2 (two) times daily.     Marland Kitchen senna (SENOKOT) 8.6 MG tablet Take 2 tablets by mouth 2 (two) times daily as needed for constipation.    . vitamin C (ASCORBIC ACID)  500 MG tablet Take 500 mg by mouth as needed.       Results for orders placed or performed during the hospital encounter of 10/22/15 (from the past 48 hour(s))  Hemoglobin-hemacue, POC     Status: None   Collection Time: 10/22/15 10:10 AM  Result Value Ref Range   Hemoglobin 14.8 13.0 - 17.0 g/dL   No results found.  Review of Systems  Constitutional: Negative for fever and chills.  HENT: Negative for hearing loss.   Eyes: Negative for blurred vision and double vision.  Respiratory: Negative for cough and hemoptysis.   Cardiovascular: Negative for chest pain and palpitations.  Gastrointestinal: Negative for nausea, vomiting and abdominal pain.   Genitourinary: Negative for dysuria and urgency.  Musculoskeletal: Negative for myalgias and neck pain.  Skin: Negative for itching and rash.  Neurological: Negative for dizziness, tingling and headaches.  Endo/Heme/Allergies: Does not bruise/bleed easily.  Psychiatric/Behavioral: Negative for depression and suicidal ideas.    Blood pressure 153/90, pulse 72, temperature 98.5 F (36.9 C), temperature source Oral, resp. rate 16, height 5\' 10"  (1.778 m), weight 99.338 kg (219 lb), SpO2 99 %. Physical Exam  Constitutional: He is oriented to person, place, and time. He appears well-developed and well-nourished.  HENT:  Head: Normocephalic and atraumatic.  Eyes: Conjunctivae are normal. Pupils are equal, round, and reactive to light.  Neck: Normal range of motion. Neck supple.  Cardiovascular: Normal rate and regular rhythm.   Respiratory: Breath sounds normal.  GI: He exhibits no distension and no mass. There is tenderness. There is no rebound.  Reducible right inguinal hernia  Musculoskeletal: He exhibits no edema or tenderness.  Neurological: He is alert and oriented to person, place, and time.  Skin: Skin is warm and dry.  Psychiatric: He has a normal mood and affect. His behavior is normal.     Assessment/Plan 55 yo male with reducible right inguinal hernia Lap right inguinal hernia repair with mesh  Mickeal Skinner, MD 10/22/2015, 10:22 AM

## 2015-10-22 NOTE — Op Note (Signed)
Preop diagnosis: right inguinal hernia  Postop diagnosis: right indirect inguinal hernia  Procedure: laparoscopic Right inguinal hernia repair with mesh (TEP)  Surgeon: Gurney Maxin, M.D.  Asst: none  Anesthesia: Gen.   Indications for procedure: Brian Walsh is a 55 y.o. male with symptoms of pain and enlarging Right inguinal hernia(s). After discussing risks, alternatives and benefits he decided on laparoscopic repair and was brought to day surgery for repair.  Description of procedure: The patient was brought into the operative suite, placed supine. Anesthesia was administered with endotracheal tube. Patient was strapped in place. Both arms were tucked. All pressure points were offloaded by foam padding. The patient was prepped and draped in the usual sterile fashion.  0.25% marcaine was injected into the area left of the umbilicus and the suprapubic space. Lateral incision was made to the Left of the umbilicus. The subcu was bluntly dissected down to the anterior resection fascia which was sharply incised. The rectus muscle was mobilized laterally and a 56mm balloon trocar was inserted without resistance. The laparoscope was inserted and the balloon was inflated to separate the planes. Next the balloon was deflated and removed. CO2 was applied. 2 27mm trocars were placed, in the inferior midline. All trocars sites were first anesthesized with 1:1 Exparel: Saline. Next the patient was placed in trendelenberg.   Blunt dissection began moving laterally to identify the ASIS and then moved medially to identify the inferior epigastric vessels and indirect hernia space. The previous open appendectomy scar made the dissection of the peritoneum slightly difficult moving superiorly and the peritoneal hole was made. There was an obvious hernia sac in the space. The sac was dissected away from the vessels and completely reduced. Next the medial space was dissected to identify the pubic bone and cooper's  ligament.  On completing this dissection there was also a small direct space defect. There was a lipoma along the cord which was dissected out of the canal.  A 3D max light weight mesh was inserted and tacked  medially to the lacunar ligament. 1 other tack was used against the abdominal wall to keep the mesh in place. The mesh was positioned flat and directly up against the direct and indirect areas. The CO2 was evacuated while watching to ensure the mesh did not migrate. The remainder of the Exparel mix was infused 2cm medial and 2cm inferior to the ASIS deep to the fascia. The anterior rectus fascia was closed with 0 vicryl in interrupted sutures and all skin incisions were closed with 4-0 monocryl subcu stitch. The patient awaoke from anesthesia and was brought to PACU in stable condition.  Findings: right indirect and small direct inguinal hernia  Specimen: none  Blood loss: 50 ml  Local anesthesia: 40 ml 1:1 Exparel:Saline, 65ml 0.25% marcaine w epi  Complications: none  Implant: 3D max light, extra large mesh  Gurney Maxin, M.D. General, Bariatric, & Minimally Invasive Surgery [facility], [place] 12:07 PM 10/22/2015

## 2015-10-22 NOTE — Anesthesia Procedure Notes (Signed)
Procedure Name: Intubation Date/Time: 10/22/2015 10:54 AM Performed by: Wanita Chamberlain Pre-anesthesia Checklist: Patient identified, Timeout performed, Emergency Drugs available, Suction available and Patient being monitored Patient Re-evaluated:Patient Re-evaluated prior to inductionOxygen Delivery Method: Circle system utilized Preoxygenation: Pre-oxygenation with 100% oxygen Intubation Type: IV induction Ventilation: Mask ventilation without difficulty Laryngoscope Size: Mac and 4 Grade View: Grade II Tube type: Oral Tube size: 8.0 mm Number of attempts: 1 Airway Equipment and Method: Stylet Placement Confirmation: breath sounds checked- equal and bilateral,  ETT inserted through vocal cords under direct vision and positive ETCO2 Secured at: 22 cm Tube secured with: Tape Dental Injury: Teeth and Oropharynx as per pre-operative assessment

## 2015-10-22 NOTE — Anesthesia Preprocedure Evaluation (Addendum)
Anesthesia Evaluation  Patient identified by MRN, date of birth, ID band Patient awake    Reviewed: Allergy & Precautions, NPO status , Patient's Chart, lab work & pertinent test results  Airway Mallampati: III  TM Distance: >3 FB Neck ROM: Full    Dental  (+) Teeth Intact   Pulmonary asthma , sleep apnea and Continuous Positive Airway Pressure Ventilation ,    breath sounds clear to auscultation       Cardiovascular  Rhythm:Regular Rate:Normal     Neuro/Psych negative neurological ROS  negative psych ROS   GI/Hepatic Neg liver ROS, PUD,   Endo/Other  negative endocrine ROS  Renal/GU negative Renal ROS  negative genitourinary   Musculoskeletal negative musculoskeletal ROS (+)   Abdominal   Peds negative pediatric ROS (+)  Hematology negative hematology ROS (+)   Anesthesia Other Findings - MS -   Reproductive/Obstetrics negative OB ROS                            Anesthesia Physical Anesthesia Plan  ASA: II  Anesthesia Plan: General   Post-op Pain Management:    Induction: Intravenous  Airway Management Planned: Oral ETT  Additional Equipment:   Intra-op Plan:   Post-operative Plan: Extubation in OR  Informed Consent: I have reviewed the patients History and Physical, chart, labs and discussed the procedure including the risks, benefits and alternatives for the proposed anesthesia with the patient or authorized representative who has indicated his/her understanding and acceptance.   Dental advisory given  Plan Discussed with: CRNA  Anesthesia Plan Comments: (Per Mr. Nohl wife, he has allergies to wood but his allergy did not show an allergy to Latex. However, she recalls an incident of lip swelling after biting a pencil.   No neurologic sequelae associated with MS. Pt aware surgical stress and anesthetic agents may precipitate MS exacerbation. )      Anesthesia  Quick Evaluation

## 2015-10-23 ENCOUNTER — Encounter (HOSPITAL_BASED_OUTPATIENT_CLINIC_OR_DEPARTMENT_OTHER): Payer: Self-pay | Admitting: General Surgery

## 2015-10-23 NOTE — Anesthesia Postprocedure Evaluation (Signed)
Anesthesia Post Note  Patient: Brian Walsh  Procedure(s) Performed: Procedure(s) (LRB): LAPAROSCOPIC RIGHT INGUINAL HERNIA REPAIR (Right) INSERTION OF MESH (Right)  Patient location during evaluation: PACU Anesthesia Type: General Level of consciousness: awake and alert Pain management: pain level controlled Vital Signs Assessment: post-procedure vital signs reviewed and stable Respiratory status: spontaneous breathing, nonlabored ventilation, respiratory function stable and patient connected to nasal cannula oxygen Cardiovascular status: blood pressure returned to baseline and stable Postop Assessment: no signs of nausea or vomiting Anesthetic complications: no    Last Vitals:  Filed Vitals:   10/22/15 1347 10/22/15 1430  BP: 148/98 162/103  Pulse: 98 98  Temp:  36.7 C  Resp: 15     Last Pain:  Filed Vitals:   10/22/15 1431  PainSc: [place] Rexann Lueras

## 2015-10-30 ENCOUNTER — Other Ambulatory Visit: Payer: Self-pay | Admitting: *Deleted

## 2015-10-30 MED ORDER — LORATADINE-PSEUDOEPHEDRINE ER 5-120 MG PO TB12: 1.0000 | ORAL_TABLET | Freq: Two times a day (BID) | ORAL | Status: DC

## 2015-10-30 NOTE — Telephone Encounter (Signed)
RF request for Claritin D  LOV: 04/04/15 Next ov: None Last written: 06/14/15 #60 w/ 2RF

## 2016-05-30 ENCOUNTER — Encounter: Payer: Self-pay | Admitting: Family Medicine

## 2016-05-30 ENCOUNTER — Ambulatory Visit (INDEPENDENT_AMBULATORY_CARE_PROVIDER_SITE_OTHER): Payer: BLUE CROSS/BLUE SHIELD | Admitting: Family Medicine

## 2016-05-30 VITALS — BP 154/89 | HR 65 | Temp 98.5°F | Resp 18 | Ht 69.25 in | Wt 227.0 lb

## 2016-05-30 DIAGNOSIS — J0100 Acute maxillary sinusitis, unspecified: Secondary | ICD-10-CM

## 2016-05-30 DIAGNOSIS — G4733 Obstructive sleep apnea (adult) (pediatric): Secondary | ICD-10-CM | POA: Diagnosis not present

## 2016-05-30 MED ORDER — METHYLPREDNISOLONE ACETATE 80 MG/ML IJ SUSP
80.0000 mg | Freq: Once | INTRAMUSCULAR | Status: AC
  Administered 2016-05-30: 80 mg via INTRAMUSCULAR

## 2016-05-30 MED ORDER — AMOXICILLIN-POT CLAVULANATE 875-125 MG PO TABS: 1.0000 | ORAL_TABLET | Freq: Two times a day (BID) | ORAL | 0 refills | Status: DC

## 2016-05-30 NOTE — Progress Notes (Signed)
Brian Walsh , 08/10/1960, 55 y.o., male MRN: [id number] Patient Care Team    Relationship Specialty Notifications Start End  Tammi Sou, MD PCP - General Family Medicine  06/11/11   Clearnce Sorrel, MD Consulting Physician Neurology  06/30/11   Gatha Mayer, MD Consulting Physician Gastroenterology  02/20/12   Alexis Frock, MD Consulting Physician Urology  06/22/12   Philemon Kingdom, MD Consulting Physician Endocrinology  01/21/15   Jerrell Belfast, MD Consulting Physician Otolaryngology  04/04/15   Mickeal Skinner, MD Consulting Physician General Surgery  09/20/15     CC: sinus pressure Subjective: Pt presents for an acute OV with complaints of sinus pressure of 3 weeks duration.  Associated symptoms include eye pressure, sinus pressure, headache, nasal congestion, hoarseness. He denies fever, chills, cough. Pt has tried saline rinses, Claritin D, flonase to ease their symptoms.He has an Asthma h/o but only needs inhaler when ill. He has MS and feels mildly off balance. He is eating and drinking well. He states the facial pressure has just become too much.   No Known Allergies Social History  Substance Use Topics  . Smoking status: Never Smoker  . Smokeless tobacco: Never Used  . Alcohol use 0.0 oz/week     Comment: occasional wine    Past Medical History:  Diagnosis Date  . Allergic rhinitis    Much improved with allergy immunotherapy (molds primarily)  . Asthma    no inhaler requirement since allergies under control with immunotherapy  . BPH (benign prostatic hypertrophy) 2014   TURP  . Cancer ([place])    skin cancer   . Chronic constipation   . Chronic sinusitis    Deviated nasal septum: >90% airway obstruction on right.  Dr. Wilburn Cornelia discussed possible septoplasty and turbinate reduction with pt in 2015.  Marland Kitchen Colon polyps   . Constipation, chronic 11/24/2011  . Multiple sclerosis ([place]) 1992   Remission since 1996 on no meds.  Initial presentation was  gait/balance and leg weakness sx's.  . OSA on CPAP 2005   cpap nightly  . Personal history of colonic polyps 03/24/2012   03/2012 - 5 mm cecal adenoma  . PUD (peptic ulcer disease)    Bleeding ulcer 1978  . Thyroid nodule 05/2012   Right lobe 35mmX14mm nodule found on screening thyroid and carotid u/s done through his employer.  Pt was euthyroid at that time.  A radioactive iodine uptake and scan was normal 06/2012.  FNA bx 04/2015 was BENIGN/CYST.   Past Surgical History:  Procedure Laterality Date  . APPENDECTOMY  1986  . COLONOSCOPY  2006, 2013    diminutive hyperplastic cecal polyp ([place]); diminutive cecal adenoma removed 2013, recall 2018  . FEMUR FRACTURE SURGERY  1978   with rod insertion.  Rod removal 1979  . FNA R Thyroid nodule  04/17/15   Benign/cystic (Dr. Cruzita Lederer)  . INGUINAL HERNIA REPAIR Right 10/22/2015   Procedure: LAPAROSCOPIC RIGHT INGUINAL HERNIA REPAIR;  Surgeon: Arta Bruce Kinsinger, MD;  Location: [facility];  Service: General;  Laterality: Right;  . INSERTION OF MESH Right 10/22/2015   Procedure: INSERTION OF MESH;  Surgeon: Mickeal Skinner, MD;  Location: [facility];  Service: General;  Laterality: Right;  . TRANSURETHRAL RESECTION OF PROSTATE N/A 08/25/2012   Procedure: TRANSURETHRAL RESECTION OF THE PROSTATE WITH GYRUS INSTRUMENTS;  Surgeon: Alexis Frock, MD;  Location: [facility];  Service: Urology;  Laterality: N/A;--patient got excellent results from procedure.   Family History  Problem Relation Age of Onset  . Breast cancer Mother   . Parkinsonism Father   . Stroke Father   . Colon polyps Father 54     Medication List       Accurate as of 05/30/16  3:42 PM. Always use your most recent med list.          albuterol 108 (90 Base) MCG/ACT inhaler Commonly known as:  PROVENTIL HFA;VENTOLIN HFA Inhale 2 puffs into the lungs every 4 (four) hours as needed for shortness of breath.   Fish Oil 1000 MG Caps Take by  mouth 2 (two) times daily.   fluticasone 50 MCG/ACT nasal spray Commonly known as:  FLONASE 2 sprays each nostril once each morning   loratadine-pseudoephedrine 5-120 MG tablet Commonly known as:  CLARITIN-D 12-hour Take 1 tablet by mouth 2 (two) times daily.   polyethylene glycol packet Commonly known as:  MIRALAX / GLYCOLAX Take 17 g by mouth as needed.   senna 8.6 MG tablet Commonly known as:  SENOKOT Take 2 tablets by mouth 2 (two) times daily as needed for constipation.   vitamin C 500 MG tablet Commonly known as:  ASCORBIC ACID Take 500 mg by mouth as needed.       No results found for this or any previous visit (from the past 24 hour(s)). No results found.   ROS: Negative, with the exception of above mentioned in HPI   Objective:  BP (!) 154/89 (BP Location: Right Arm, Patient Position: Sitting, Cuff Size: Large)   Pulse 65   Temp 98.5 F (36.9 C) (Oral)   Resp 18   Ht 5' 9.25" (1.759 m)   Wt 227 lb (103 kg)   SpO2 97%   BMI 33.28 kg/m  Body mass index is 33.28 kg/m. Gen: Afebrile. No acute distress. Nontoxic in appearance, well developed, well nourished. Pleasant male.  HENT: AT. [place]. Bilateral TM visualized with bilateral erythema. MMM, no oral lesions. Bilateral nares with erythema, bleeding, right nasal septum sore, drainage. Throat without erythema or exudates. PND present. Mild cough, hoarseness and TTP bilateral max sinus.  Eyes:Pupils Equal Round Reactive to light, Extraocular movements intact,  Conjunctiva without redness, discharge or icterus. Neck/lymp/endocrine: Supple,no lymphadenopathy CV: RRR  Chest: CTAB, no wheeze or crackles. Good air movement, normal resp effort.  Abd: Soft. NTND. BS present.  Skin: no rashes, purpura or petechiae.  Neuro:  Normal gait. PERLA. EOMi. Alert. Oriented x3   Assessment/Plan: Brian Walsh is a 55 y.o. male present for acute OV for  Acute maxillary sinusitis, recurrence not specified - methylPREDNISolone  acetate (DEPO-MEDROL) injection 80 mg; Inject 1 mL (80 mg total) into the muscle once. - Augmetnin BID x 10 d - Nasal saline, floanse. Bag balm on sore.  - F/u PRN, or 1 week if symptoms not improving or worsening.   electronically signed by:  Howard Pouch, DO  [place]

## 2016-05-30 NOTE — Patient Instructions (Signed)
Steroid shot today to help with inflammation of sinus. Augmentin prescribed every 12 hours for 10 days.  Saline flushes, flonase, and calritin continued.  Try placing a small amount of bag balm on sore in nose until healed.

## 2016-05-30 NOTE — Progress Notes (Signed)
Pre visit review using our clinic review tool, if applicable. No additional management support is needed unless otherwise documented below in the visit note. 

## 2016-09-17 ENCOUNTER — Encounter: Payer: Self-pay | Admitting: *Deleted

## 2016-10-06 DIAGNOSIS — D2272 Melanocytic nevi of left lower limb, including hip: Secondary | ICD-10-CM | POA: Diagnosis not present

## 2016-10-06 DIAGNOSIS — D1801 Hemangioma of skin and subcutaneous tissue: Secondary | ICD-10-CM | POA: Diagnosis not present

## 2016-10-06 DIAGNOSIS — D044 Carcinoma in situ of skin of scalp and neck: Secondary | ICD-10-CM | POA: Diagnosis not present

## 2016-10-06 DIAGNOSIS — L570 Actinic keratosis: Secondary | ICD-10-CM | POA: Diagnosis not present

## 2016-10-06 DIAGNOSIS — Z85828 Personal history of other malignant neoplasm of skin: Secondary | ICD-10-CM | POA: Diagnosis not present

## 2016-12-24 ENCOUNTER — Encounter: Payer: BLUE CROSS/BLUE SHIELD | Admitting: Family Medicine

## 2017-01-02 ENCOUNTER — Ambulatory Visit (INDEPENDENT_AMBULATORY_CARE_PROVIDER_SITE_OTHER): Payer: BLUE CROSS/BLUE SHIELD | Admitting: Family Medicine

## 2017-01-02 ENCOUNTER — Encounter: Payer: Self-pay | Admitting: Family Medicine

## 2017-01-02 VITALS — BP 156/91 | HR 71 | Temp 97.7°F | Resp 16 | Ht 69.25 in | Wt 226.0 lb

## 2017-01-02 DIAGNOSIS — Z125 Encounter for screening for malignant neoplasm of prostate: Secondary | ICD-10-CM | POA: Diagnosis not present

## 2017-01-02 DIAGNOSIS — Z23 Encounter for immunization: Secondary | ICD-10-CM | POA: Diagnosis not present

## 2017-01-02 DIAGNOSIS — G35 Multiple sclerosis: Secondary | ICD-10-CM

## 2017-01-02 DIAGNOSIS — Z0000 Encounter for general adult medical examination without abnormal findings: Secondary | ICD-10-CM

## 2017-01-02 DIAGNOSIS — Z114 Encounter for screening for human immunodeficiency virus [HIV]: Secondary | ICD-10-CM | POA: Diagnosis not present

## 2017-01-02 DIAGNOSIS — Z9189 Other specified personal risk factors, not elsewhere classified: Secondary | ICD-10-CM | POA: Diagnosis not present

## 2017-01-02 DIAGNOSIS — Z1159 Encounter for screening for other viral diseases: Secondary | ICD-10-CM

## 2017-01-02 LAB — PSA: PSA: 0.65 ng/mL (ref 0.10–4.00)

## 2017-01-02 NOTE — Patient Instructions (Signed)

## 2017-01-02 NOTE — Addendum Note (Signed)
Addended by: Gordy Councilman on: 01/02/2017 10:51 AM   Modules accepted: Orders

## 2017-01-02 NOTE — Progress Notes (Signed)
Office Note 01/02/2017  CC:  Chief Complaint  Patient presents with  . Annual Exam    CPE    HPI:  Brian Walsh is a 56 y.o. White male who is here for annual health maintenance exam.  Chronic constipation: exercising and this helps.  Gets tightness in his lower back when eating.  Rare nausea but no vomiting. No signif GER feelings, no abd pains.  No scapular pain.  Worse with big meals. Takes metamucil.  No otc constipation meds at this time.  Senakot S was helping at one time.  Past Medical History:  Diagnosis Date  . Allergic rhinitis    Much improved with allergy immunotherapy (molds primarily)  . Asthma    no inhaler requirement since allergies under control with immunotherapy  . BPH (benign prostatic hypertrophy) 2014   TURP  . Cancer ([place])    skin cancer   . Chronic constipation   . Chronic sinusitis    Deviated nasal septum: >90% airway obstruction on right.  Dr. Wilburn Cornelia discussed possible septoplasty and turbinate reduction with pt in 2015.  Marland Kitchen Colon polyps   . Constipation, chronic 11/24/2011  . Multiple sclerosis ([place]) 1992   Remission since 1996 on no meds.  Initial presentation was gait/balance and leg weakness sx's.  . OSA on CPAP 2005   cpap nightly  . Personal history of colonic polyps 03/24/2012   03/2012 - 5 mm cecal adenoma  . PUD (peptic ulcer disease)    Bleeding ulcer 1978  . Thyroid nodule 05/2012   Right lobe 59mmX14mm nodule found on screening thyroid and carotid u/s done through his employer.  Pt was euthyroid at that time.  A radioactive iodine uptake and scan was normal 06/2012.  FNA bx 04/2015 was BENIGN/CYST.    Past Surgical History:  Procedure Laterality Date  . APPENDECTOMY  1986  . COLONOSCOPY  2006, 2013    diminutive hyperplastic cecal polyp ([place]); diminutive cecal adenoma removed 2013, recall 2018  . FEMUR FRACTURE SURGERY  1978   with rod insertion.  Rod removal 1979  . FNA R Thyroid nodule  04/17/15   Benign/cystic (Dr.  Cruzita Lederer)  . INGUINAL HERNIA REPAIR Right 10/22/2015   Procedure: LAPAROSCOPIC RIGHT INGUINAL HERNIA REPAIR;  Surgeon: Arta Bruce Kinsinger, MD;  Location: [facility];  Service: General;  Laterality: Right;  . INSERTION OF MESH Right 10/22/2015   Procedure: INSERTION OF MESH;  Surgeon: Mickeal Skinner, MD;  [facility];  Service: General;  Laterality: Right;  . TRANSURETHRAL RESECTION OF PROSTATE N/A 08/25/2012   Procedure: TRANSURETHRAL RESECTION OF THE PROSTATE WITH GYRUS INSTRUMENTS;  Surgeon: Alexis Frock, MD;  Location: [facility];  Service: Urology;  Laterality: N/A;--patient got excellent results from procedure.    Family History  Problem Relation Age of Onset  . Parkinsonism Father   . Stroke Father   . Colon polyps Father 31  . Breast cancer Mother     Social History   Social History  . Marital status: Married    Spouse name: N/A  . Number of children: 1  . Years of education: N/A   Occupational History  . HR manager [employer]   Social History Main Topics  . Smoking status: Never Smoker  . Smokeless tobacco: Never Used  . Alcohol use 0.0 oz/week     Comment: occasional wine   . Drug use: No  . Sexual activity: Not on file   Other Topics Concern  . Not  on file   Social History Narrative   Married, has one 61 y/o son.   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   JD and MBA from [place].   No tobacco, 3-4 glasses of red wine per week, no drug use.   No exercise.    Outpatient Medications Prior to Visit  Medication Sig Dispense Refill  . albuterol (PROVENTIL HFA;VENTOLIN HFA) 108 (90 BASE) MCG/ACT inhaler Inhale 2 puffs into the lungs every 4 (four) hours as needed for shortness of breath. 2 Inhaler 1  . fluticasone (FLONASE) 50 MCG/ACT nasal spray 2 sprays each nostril once each morning 48 g 2  . loratadine-pseudoephedrine (CLARITIN-D 12-HOUR) 5-120 MG tablet Take  1 tablet by mouth 2 (two) times daily. 60 tablet 11  . Omega-3 Fatty Acids (FISH OIL) 1000 MG CAPS Take by mouth 2 (two) times daily.     . vitamin C (ASCORBIC ACID) 500 MG tablet Take 500 mg by mouth as needed.     . polyethylene glycol (MIRALAX / GLYCOLAX) packet Take 17 g by mouth as needed.    . senna (SENOKOT) 8.6 MG tablet Take 2 tablets by mouth 2 (two) times daily as needed for constipation.    Marland Kitchen amoxicillin-clavulanate (AUGMENTIN) 875-125 MG tablet Take 1 tablet by mouth 2 (two) times daily. (Patient not taking: Reported on 01/02/2017) 20 tablet 0   No facility-administered medications prior to visit.     No Known Allergies  ROS ROS: Review of Systems  Constitutional: Negative for appetite change, chills, fatigue and fever.  HENT: Negative for congestion, dental problem, ear pain and sore throat.   Eyes: Negative for discharge, redness and visual disturbance.  Respiratory: Negative for cough, chest tightness, shortness of breath and wheezing.   Cardiovascular: Negative for chest pain, palpitations and leg swelling.  Gastrointestinal: Positive for constipation (chronic-see HPI). Negative for abdominal pain, blood in stool, diarrhea, nausea and vomiting.  Genitourinary: Negative for difficulty urinating, dysuria, flank pain, frequency, hematuria and urgency.  Musculoskeletal: Negative for arthralgias, back pain, joint swelling, myalgias and neck stiffness.  Skin: Negative for pallor and rash.  Neurological: Negative for dizziness, speech difficulty, weakness and headaches.  Hematological: Negative for adenopathy. Does not bruise/bleed easily.  Psychiatric/Behavioral: Negative for confusion and sleep disturbance. The patient is not nervous/anxious.     PE; Blood pressure (!) 156/91, pulse 71, temperature 97.7 F (36.5 C), temperature source Oral, resp. rate 16, height 5' 9.25" (1.759 m), weight 226 lb (102.5 kg), SpO2 97 %. Gen: Alert, well appearing.  Patient is oriented to  person, place, time, and situation. AFFECT: pleasant, lucid thought and speech. ENT: Ears: EACs clear, normal epithelium.  TMs with good light reflex and landmarks bilaterally.  Eyes: no injection, icteris, swelling, or exudate.  EOMI, PERRLA. Nose: no drainage or turbinate edema/swelling.  No injection or focal lesion.  Mouth: lips without lesion/swelling.  Oral mucosa pink and moist.  Dentition intact and without obvious caries or gingival swelling.  Oropharynx without erythema, exudate, or swelling.  Neck: supple/nontender.  No LAD, mass, or TM.  Carotid pulses 2+ bilaterally, without bruits. CV: RRR, no m/r/g.   LUNGS: CTA bilat, nonlabored resps, good aeration in all lung fields. ABD: soft, NT, ND, BS normal.  No hepatospenomegaly or mass.  No bruits. EXT: no clubbing, cyanosis, or edema.  Musculoskeletal: no joint swelling, erythema, warmth, or tenderness.  ROM of all joints intact. Skin - no sores or suspicious lesions or rashes or  color changes Rectal exam: negative without mass, lesions or tenderness, PROSTATE EXAM: smooth and symmetric without nodules or tenderness.   Pertinent labs:  Lab Results  Component Value Date   TSH 1.05 07/03/2014   Lab Results  Component Value Date   WBC 5.6 02/28/2015   HGB 14.8 10/22/2015   HCT 44.4 02/28/2015   MCV 90.7 02/28/2015   PLT 253.0 02/28/2015   Lab Results  Component Value Date   CREATININE 1.02 02/28/2015   BUN 14 02/28/2015   NA 139 02/28/2015   K 4.3 02/28/2015   CL 104 02/28/2015   CO2 30 02/28/2015   Lab Results  Component Value Date   ALT 32 02/28/2015   AST 20 02/28/2015   ALKPHOS 55 02/28/2015   BILITOT 0.4 02/28/2015   Lab Results  Component Value Date   PSA 1.73 06/12/2011    ASSESSMENT AND PLAN:   1) Chronic constipation: needs to continue metamucil and RESUME daily senakot S generic.  2) Health maintenance exam: Reviewed age and gender appropriate health maintenance issues (prudent diet, regular  exercise, health risks of tobacco and excessive alcohol, use of seatbelts, fire alarms in home, use of sunscreen).  Also reviewed age and gender appropriate health screening as well as vaccine recommendations. Vaccines: Td UTD.  Shingrix discussed today--we gave him shingrix #1 here today. HP labs to be done at his work.  Hep C and HIV screening ordered today. Prostate ca screening: hx of TURP with Dr. Tresa Moore.  DRE normal today, PSA ordered. Colon ca screening/hx of polyps: next colonoscopy due this year 03/2017 (Dr. Carlean Purl). Should be getting recall letter soon.  3) Multiple sclerosis: dx'd in the 90s, currently asymptomatic.  Pt desires referral to get established with a neurologist locally.  An After Visit Summary was printed and given to the patient.  FOLLOW UP:  Return in about 1 year (around 01/02/2018) for annual CPE (fasting).  Signed:  Crissie Sickles, MD           01/02/2017

## 2017-01-02 NOTE — Addendum Note (Signed)
Addended by: Tammi Sou on: 01/02/2017 01:21 PM   Modules accepted: Orders

## 2017-01-03 LAB — HEPATITIS C ANTIBODY: HCV Ab: NEGATIVE

## 2017-01-03 LAB — HIV ANTIBODY (ROUTINE TESTING W REFLEX): HIV 1&2 Ab, 4th Generation: NONREACTIVE

## 2017-01-04 ENCOUNTER — Telehealth: Payer: Self-pay | Admitting: Family Medicine

## 2017-01-04 NOTE — Telephone Encounter (Signed)
Hep C screening and HIV screening NEGATIVE. PSA normal.  Reassure pt.-thx

## 2017-01-05 NOTE — Telephone Encounter (Signed)
MyChart message has been read

## 2017-01-08 ENCOUNTER — Encounter: Payer: Self-pay | Admitting: Neurology

## 2017-01-08 ENCOUNTER — Encounter: Payer: Self-pay | Admitting: Family Medicine

## 2017-01-10 IMAGING — US US THYROID BIOPSY
1 series · 13 of 15 positions shown · non-contrast
Comparison: Prior thyroid ultrasound 01/29/2015

CLINICAL DATA: 54-year-old male with right-sided complex cystic and
solid nodule.

EXAM:
ULTRASOUND GUIDED NEEDLE ASPIRATE BIOPSY OF THE THYROID GLAND

[Series 1: us thyroid biopsy · 0.08mm/px · 15 acquisitions, 13 frames shown]
[im 1/15]
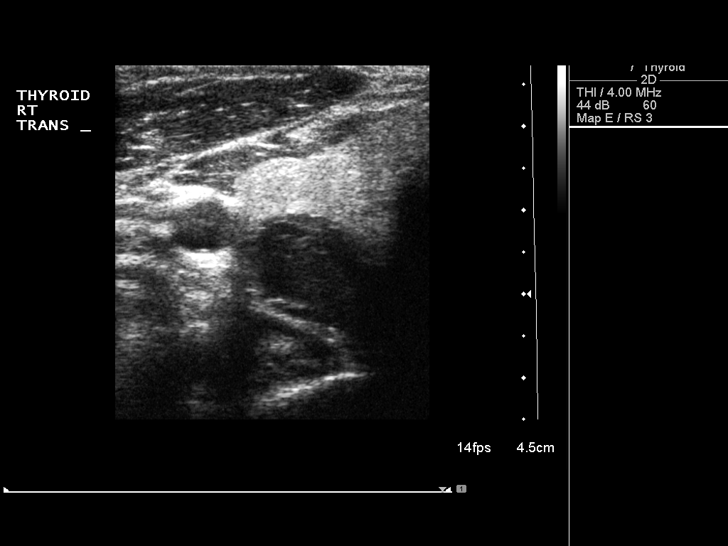
[im 2/15]
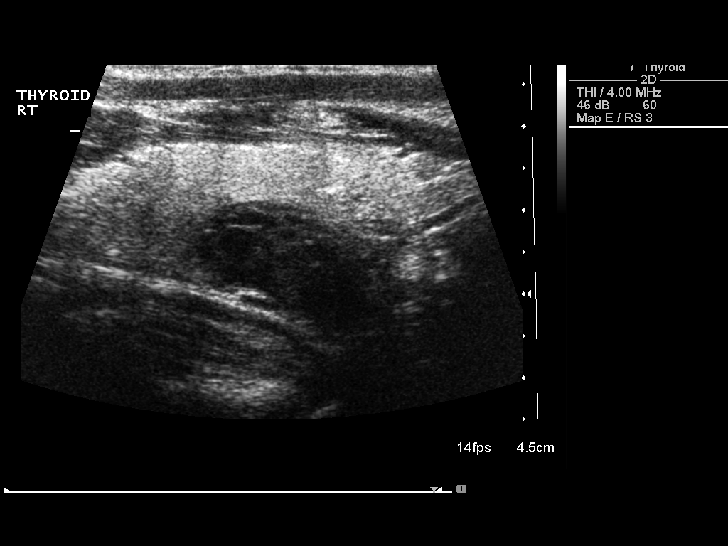
[im 3/15]
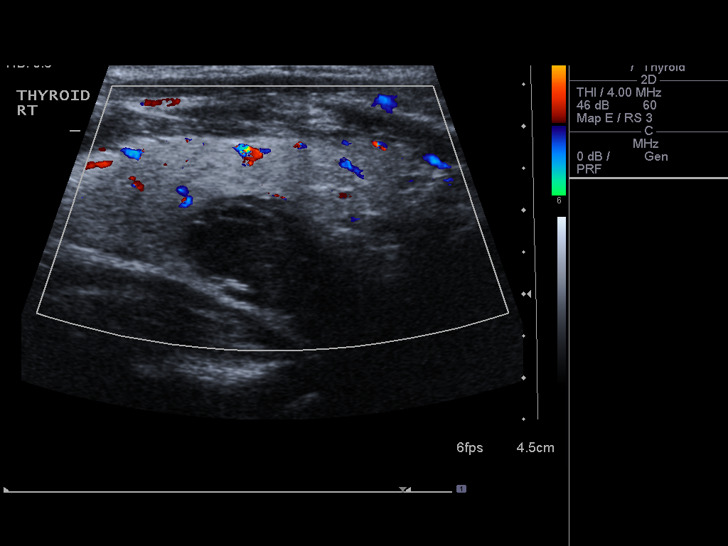
[im 5/15]
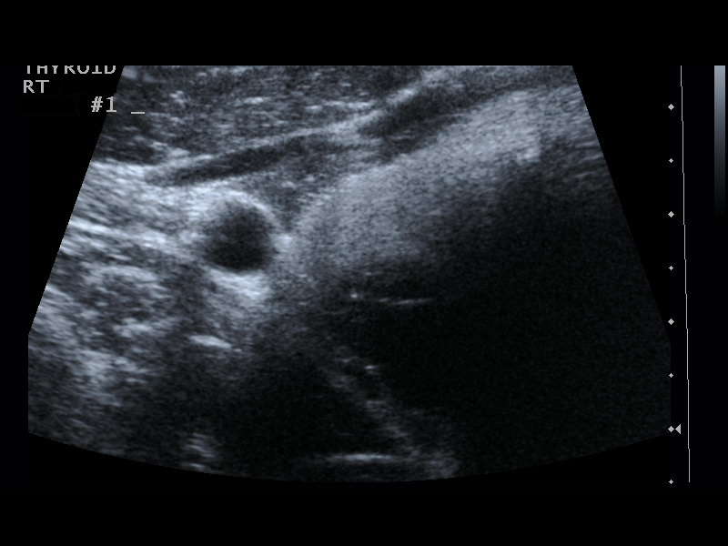
[im 6/15]
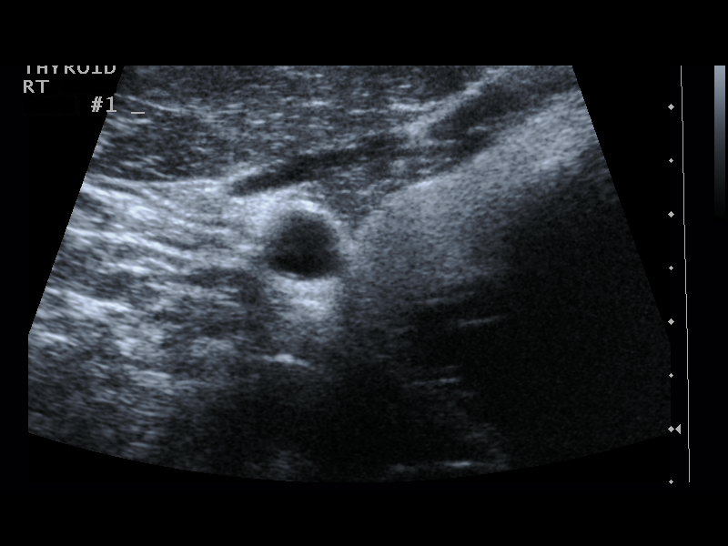
[im 7/15]
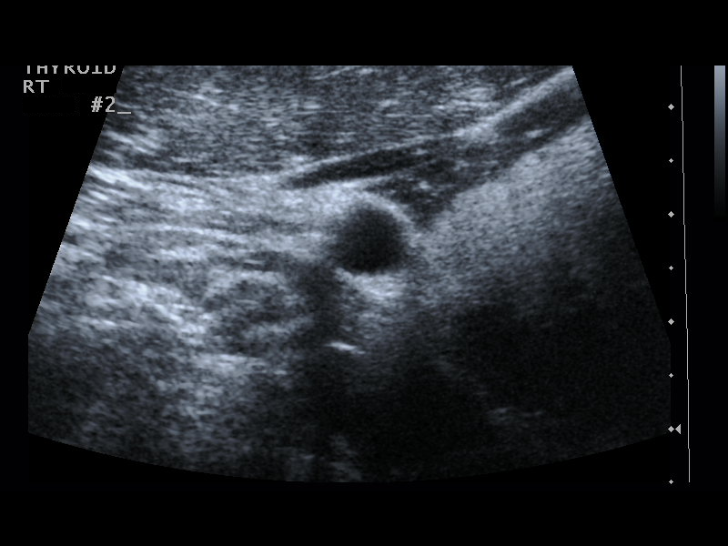
[im 8/15]
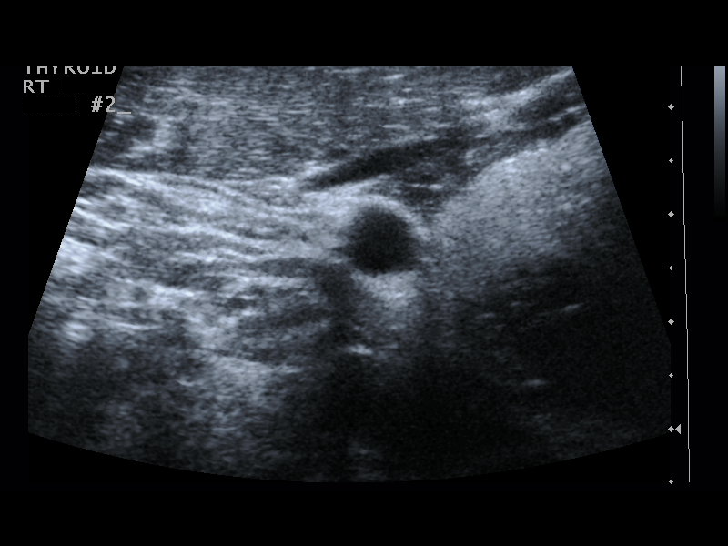
[im 9/15]
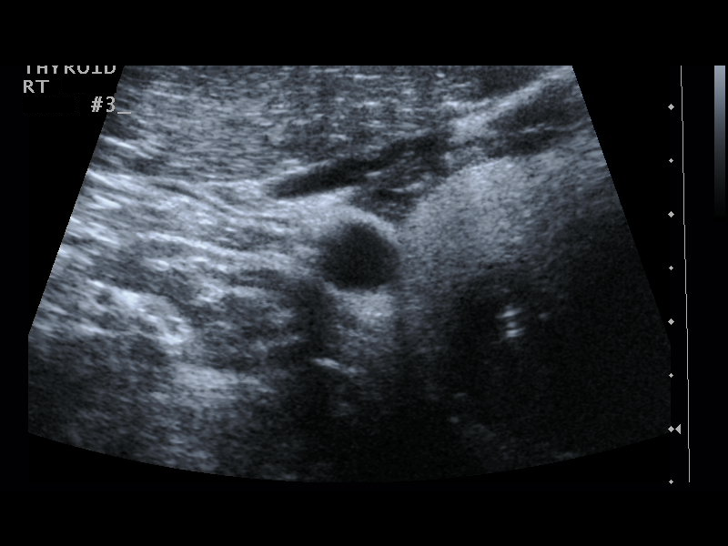
[im 10/15]
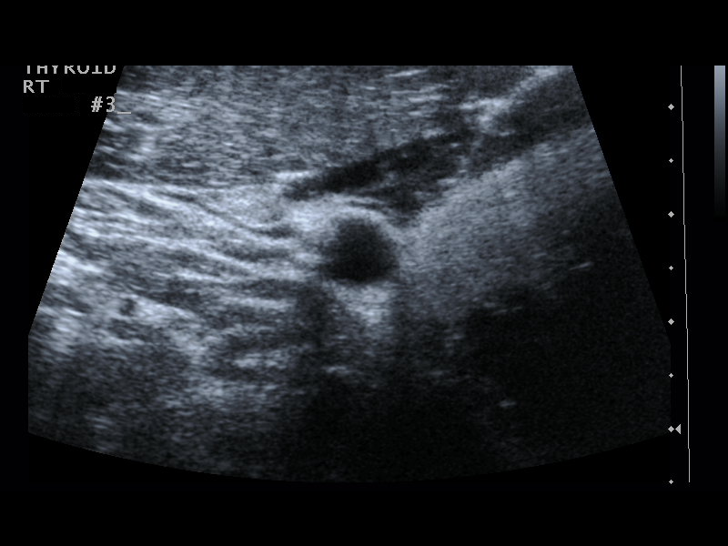
[im 11/15]
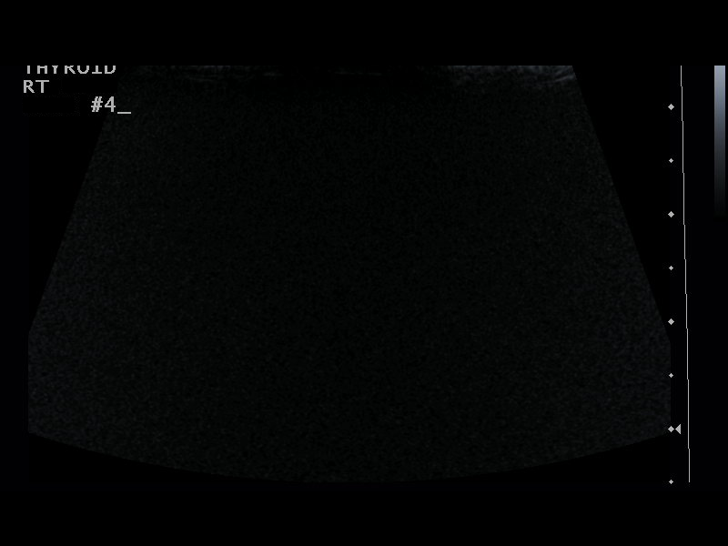
[im 13/15]
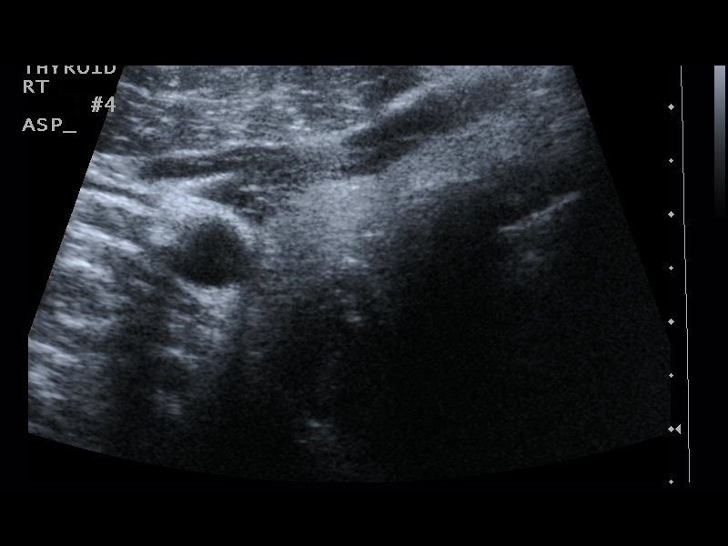
[im 14/15]
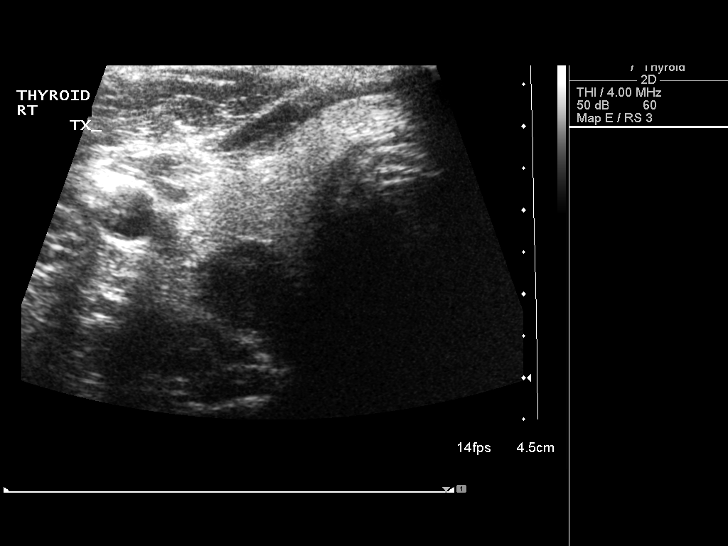
[im 15/15]
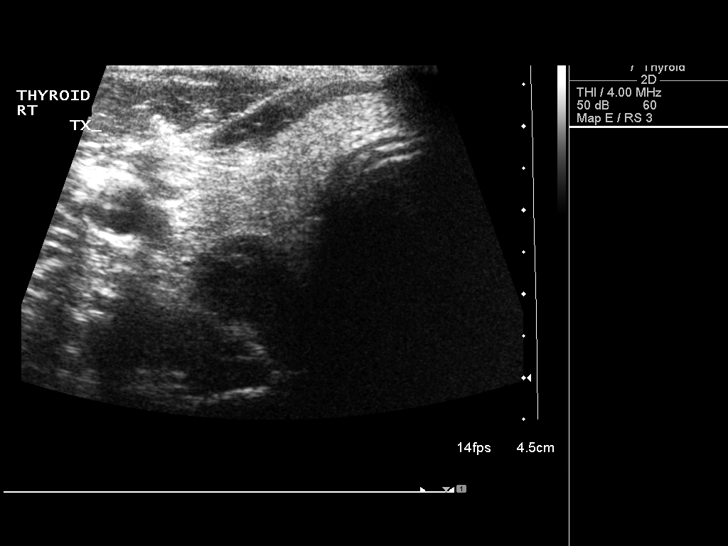

[13 of 15 positions shown; findings below may reference images not displayed]

PROCEDURE:
Thyroid biopsy was thoroughly discussed with the patient and
questions were answered. The benefits, risks, alternatives, and
complications were also discussed. The patient understands and
wishes to proceed with the procedure. Written consent was obtained.

Ultrasound was performed to localize and mark an adequate site for
the biopsy. The patient was then prepped and draped in a normal
sterile fashion. Local anesthesia was provided with 1% lidocaine.
Using direct ultrasound guidance, 4 passes were made using needles
into the nodule within the right lobe of the thyroid. Ultrasound was
used to confirm needle placements on all occasions. Specimens were
sent to Pathology for analysis.

COMPLICATIONS:
COMPLICATIONS
None
FINDINGS: The complex nodule is predominantly cystic. Aspiration yielded dark
brown cystic fluid consistent with prior hemorrhage.
IMPRESSION: Ultrasound guided needle aspirate biopsy performed of the right
thyroid nodule.

Of note, the nodule is predominantly cystic. Aspirates yielded
predominantly brownish cystic fluid consistent with prior
hemorrhage.

## 2017-03-06 ENCOUNTER — Ambulatory Visit: Payer: BLUE CROSS/BLUE SHIELD

## 2017-03-17 ENCOUNTER — Ambulatory Visit: Payer: BLUE CROSS/BLUE SHIELD

## 2017-03-17 ENCOUNTER — Ambulatory Visit (INDEPENDENT_AMBULATORY_CARE_PROVIDER_SITE_OTHER): Payer: BLUE CROSS/BLUE SHIELD

## 2017-03-17 DIAGNOSIS — Z23 Encounter for immunization: Secondary | ICD-10-CM | POA: Diagnosis not present

## 2017-03-17 NOTE — Progress Notes (Signed)
Patient presents today for Shingles #2 vaccine. Given with no incidence or problems. Patient left with no complaints.

## 2017-03-23 NOTE — Progress Notes (Signed)
Noted. Agree.

## 2017-03-30 DIAGNOSIS — G4733 Obstructive sleep apnea (adult) (pediatric): Secondary | ICD-10-CM | POA: Diagnosis not present

## 2017-04-09 DIAGNOSIS — D2262 Melanocytic nevi of left upper limb, including shoulder: Secondary | ICD-10-CM | POA: Diagnosis not present

## 2017-04-09 DIAGNOSIS — D225 Melanocytic nevi of trunk: Secondary | ICD-10-CM | POA: Diagnosis not present

## 2017-04-09 DIAGNOSIS — D2261 Melanocytic nevi of right upper limb, including shoulder: Secondary | ICD-10-CM | POA: Diagnosis not present

## 2017-04-09 DIAGNOSIS — D045 Carcinoma in situ of skin of trunk: Secondary | ICD-10-CM | POA: Diagnosis not present

## 2017-04-09 DIAGNOSIS — L821 Other seborrheic keratosis: Secondary | ICD-10-CM | POA: Diagnosis not present

## 2017-04-21 ENCOUNTER — Encounter: Payer: Self-pay | Admitting: Neurology

## 2017-04-21 ENCOUNTER — Ambulatory Visit (INDEPENDENT_AMBULATORY_CARE_PROVIDER_SITE_OTHER): Payer: BLUE CROSS/BLUE SHIELD | Admitting: Neurology

## 2017-04-21 ENCOUNTER — Other Ambulatory Visit: Payer: BLUE CROSS/BLUE SHIELD

## 2017-04-21 VITALS — BP 132/88 | HR 77 | Ht 69.0 in | Wt 227.2 lb

## 2017-04-21 DIAGNOSIS — G35 Multiple sclerosis: Secondary | ICD-10-CM | POA: Diagnosis not present

## 2017-04-21 NOTE — Patient Instructions (Signed)
Check vitamin D and B12 levels Contact me if you wish to pursue new baseline MRIs or to start a MS drug

## 2017-04-21 NOTE — Progress Notes (Signed)
NEUROLOGY CONSULTATION NOTE  Brian Walsh MRN: [id number] DOB: 03-11-1961  Referring provider: Dr. Anitra Lauth Primary care provider: Dr. Anitra Lauth  Reason for consult:  Multiple sclerosis.  HISTORY OF PRESENT ILLNESS: Brian Walsh is a 56 year old right-handed male with asthma, PUD, BPH and OSA who presents to establish care regarding multiple sclerosis.  History supplemented by prior neurologist's notes.  In 1992, he developed onset of left lower extremity weakness that caused difficulty with gait and balance.  He was diagnosed with multiple sclerosis via brain MRI (multiple pericollosal lesions noted) and CSF analysis negative for oligoclonal bands but positive for elevated IgG index.  He was treated with IV SoluMedrol at that time.  He had two relapses in [place], presenting as blurred vision and incoordination, which also required treatment with SoluMedrol.  He has not had a relapse since 1996.  He has never been on a disease modifying drug.  He takes 2000 IU D3 daily, as well as calcium and zinc.  His last MRI was over 10 years ago.  Overall, he is feeling well with no significant chronic symptoms. Fatigue:  He does report feeling tired but it is also related to long hours. Bladder control:  He has BPH.  He has been doing well since undergoing TURP. Chronic constipation:  He is doing well with Senokot and Metamucil Cognitive deficits:  He denies any concerning memory deficits. He reports occasional tingling in the hands and feet.  He does report impaired glucose intolerance.  He tries to lead a healthy lifestyle with less red meat and more fish and green vegetables.  He exercises, usually early in the morning at a gym so to prevent from becoming overheated.  He has been under a lot of stress.  His wife has stage 4 colon cancer.  His son has epilepsy.  Work can be stressful.  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  At this time, he is not interested in starting DMT.  He  would just like to follow up and establish care with a new neurologist.  PAST MEDICAL HISTORY: Past Medical History:  Diagnosis Date  . Allergic rhinitis    Much improved with allergy immunotherapy (molds primarily)  . Asthma    no inhaler requirement since allergies under control with immunotherapy  . BPH (benign prostatic hypertrophy) 2014   TURP  . Cancer ([place])    skin cancer   . Chronic constipation   . Chronic sinusitis    Deviated nasal septum: >90% airway obstruction on right.  Dr. Wilburn Cornelia discussed possible septoplasty and turbinate reduction with pt in 2015.  Marland Kitchen Colon polyps   . Constipation, chronic 11/24/2011  . Multiple sclerosis ([place]) 1992   Remission since 1996 on no meds.  Initial presentation was gait/balance and leg weakness sx's.  . OSA on CPAP 2005   cpap nightly  . Personal history of colonic polyps 03/24/2012   03/2012 - 5 mm cecal adenoma  . PUD (peptic ulcer disease)    Bleeding ulcer 1978  . Thyroid nodule 05/2012   Right lobe 61mmX14mm nodule found on screening thyroid and carotid u/s done through his employer.  Pt was euthyroid at that time.  A radioactive iodine uptake and scan was normal 06/2012.  FNA bx 04/2015 was BENIGN/CYST.    PAST SURGICAL HISTORY: Past Surgical History:  Procedure Laterality Date  . APPENDECTOMY  1986  . COLONOSCOPY  2006, 2013    diminutive hyperplastic cecal polyp [facility]); diminutive cecal adenoma removed  2013, recall 2018  . FEMUR FRACTURE SURGERY  1978   with rod insertion.  Rod removal 1979  . FNA R Thyroid nodule  04/17/15   Benign/cystic (Dr. Cruzita Lederer)  . INGUINAL HERNIA REPAIR Right 10/22/2015   Procedure: LAPAROSCOPIC RIGHT INGUINAL HERNIA REPAIR;  Surgeon: Arta Bruce Kinsinger, MD;  Location: [facility];  Service: General;  Laterality: Right;  . INSERTION OF MESH Right 10/22/2015   Procedure: INSERTION OF MESH;  Surgeon: Mickeal Skinner, MD;  Location: [facility];  Service:  General;  Laterality: Right;  . TRANSURETHRAL RESECTION OF PROSTATE N/A 08/25/2012   Procedure: TRANSURETHRAL RESECTION OF THE PROSTATE WITH GYRUS INSTRUMENTS;  Surgeon: Alexis Frock, MD;  Location: [facility];  Service: Urology;  Laterality: N/A;--patient got excellent results from procedure.    MEDICATIONS: Current Outpatient Prescriptions on File Prior to Visit  Medication Sig Dispense Refill  . albuterol (PROVENTIL HFA;VENTOLIN HFA) 108 (90 BASE) MCG/ACT inhaler Inhale 2 puffs into the lungs every 4 (four) hours as needed for shortness of breath. 2 Inhaler 1  . fluorouracil (EFUDEX) 5 % cream   0  . fluticasone (FLONASE) 50 MCG/ACT nasal spray 2 sprays each nostril once each morning 48 g 2  . loratadine-pseudoephedrine (CLARITIN-D 12-HOUR) 5-120 MG tablet Take 1 tablet by mouth 2 (two) times daily. 60 tablet 11  . Omega-3 Fatty Acids (FISH OIL) 1000 MG CAPS Take by mouth 2 (two) times daily.     . polyethylene glycol (MIRALAX / GLYCOLAX) packet Take 17 g by mouth as needed.    . psyllium (METAMUCIL) 58.6 % powder Take 1 packet by mouth 3 (three) times daily.    Marland Kitchen senna (SENOKOT) 8.6 MG tablet Take 2 tablets by mouth 2 (two) times daily as needed for constipation.    . vitamin C (ASCORBIC ACID) 500 MG tablet Take 500 mg by mouth as needed.      No current facility-administered medications on file prior to visit.     ALLERGIES: No Known Allergies  FAMILY HISTORY: Family History  Problem Relation Age of Onset  . Parkinsonism Father   . Stroke Father   . Colon polyps Father 12  . Breast cancer Mother     SOCIAL HISTORY: Social History   Social History  . Marital status: Married    Spouse name: N/A  . Number of children: 1  . Years of education: N/A   Occupational History  . HR manager [employer]   Social History Main Topics  . Smoking status: Never Smoker  . Smokeless tobacco: Never Used  . Alcohol use 0.6 oz/week    1 Glasses of wine per week     Comment:  occasional wine   . Drug use: No  . Sexual activity: Not on file   Other Topics Concern  . Not on file   Social History Narrative   Married, has one 23 y/o son.   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   JD and MBA from [employer].   No tobacco, 3-4 glasses of red wine per week, no drug use.   No exercise.   [place], plays tennis, works out at gym 5 days a week,. 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    REVIEW OF SYSTEMS: Constitutional: No fevers, chills, or sweats, no generalized fatigue, change in appetite Eyes: No visual changes, double vision, eye pain Ear, nose and throat: No hearing loss, ear pain, nasal congestion, sore throat  Cardiovascular: No chest pain, palpitations Respiratory:  No shortness of breath at rest or with exertion, wheezes GastrointestinaI: No nausea, vomiting, diarrhea, abdominal pain, fecal incontinence Genitourinary:  No dysuria, urinary retention or frequency Musculoskeletal:  No neck pain, back pain Integumentary: No rash, pruritus, skin lesions Neurological: as above Psychiatric: No depression, insomnia, anxiety Endocrine: No palpitations, fatigue, diaphoresis, mood swings, change in appetite, change in weight, increased thirst Hematologic/Lymphatic:  No purpura, petechiae. Allergic/Immunologic: no itchy/runny eyes, nasal congestion, recent allergic reactions, rashes  PHYSICAL EXAM: Vitals:   04/21/17 0810  BP: 132/88  Pulse: 77  SpO2: 99%   General: No acute distress.  Patient appears well-groomed.  Head:  Normocephalic/atraumatic Eyes:  fundi examined but not visualized Neck: supple, no paraspinal tenderness, full range of motion Back: No paraspinal tenderness Heart: regular rate and rhythm Lungs: Clear to auscultation bilaterally. Vascular: No carotid bruits. Neurological Exam: Mental status: alert and oriented to person, place, and time, recent and remote memory intact, fund of knowledge intact,  attention and concentration intact, speech fluent and not dysarthric, language intact. Cranial nerves: CN I: not tested CN II: pupils equal, round and reactive to light, visual fields intact CN III, IV, VI:  full range of motion, no nystagmus, no ptosis CN V: facial sensation intact CN VII: upper and lower face symmetric CN VIII: hearing intact CN IX, X: gag intact, uvula midline CN XI: sternocleidomastoid and trapezius muscles intact CN XII: tongue midline Bulk & Tone: normal, no fasciculations. Motor:  5/5 throughout  Sensation: temperature and vibration sensation intact. Deep Tendon Reflexes:  2+ throughout, toes downgoing.  Finger to nose testing:  Without dysmetria.  Heel to shin:  Without dysmetria.  Gait:  Normal station and stride.  Timed 25 foot walk 3.86 seconds.  Able to turn but some difficulty with tandem walk. Romberg negative.  IMPRESSION: Relapsing-remitting multiple sclerosis.  Fortunately, he has been doing well from a clinical standpoint.  However, I believe it is still worth being on disease modifying therapy in order to prevent mild subclinical disease progression.  We did discuss several DMT options.  I also recommended ordering another MRI of brain and cervical spine in order to establish a new baseline.  At this time, he wishes to hold off on ordering the MRI or starting on DMT.  I provided him with information on available DMT.  He will contact me if he changes his mind about getting the MRI and/or starting DMT.  In the meantime, we will check vitamin D and B12 level.   45 minutes spent face to face with patient, over 50% spent discussing disease, recommendation for management and available DMT.  Thank you for allowing me to take part in the care of this patient.  Metta Clines, DO  CC:  [name], MD

## 2017-04-24 ENCOUNTER — Telehealth: Payer: Self-pay

## 2017-04-24 LAB — VITAMIN D 1,25 DIHYDROXY
VITAMIN D 1, 25 (OH) TOTAL: 36 pg/mL (ref 18–72)
VITAMIN D3 1, 25 (OH): 36 pg/mL

## 2017-04-24 LAB — VITAMIN B12: VITAMIN B 12: 230 pg/mL (ref 200–1100)

## 2017-04-24 NOTE — Telephone Encounter (Signed)
LMOM asking pt to return call to the office for message below

## 2017-04-24 NOTE — Telephone Encounter (Signed)
-----   Message from Pieter Partridge, DO sent at 04/24/2017  7:18 AM EDT ----- Let Brian Walsh know that his B12 and vitamin D levels are in the low normal range.  I would like him to start B12 1016mcg daily as discussed, as well as increasing his D3 from 2000 IU daily to 4000 IU daily.

## 2017-04-27 ENCOUNTER — Encounter: Payer: Self-pay | Admitting: Family Medicine

## 2017-05-18 ENCOUNTER — Encounter: Payer: Self-pay | Admitting: Internal Medicine

## 2017-05-28 ENCOUNTER — Encounter: Payer: Self-pay | Admitting: Internal Medicine

## 2017-06-10 ENCOUNTER — Other Ambulatory Visit: Payer: Self-pay

## 2017-06-10 ENCOUNTER — Encounter: Payer: Self-pay | Admitting: Family Medicine

## 2017-06-10 ENCOUNTER — Ambulatory Visit: Payer: BLUE CROSS/BLUE SHIELD | Admitting: Family Medicine

## 2017-06-10 VITALS — BP 152/75 | HR 71 | Temp 98.2°F | Resp 16 | Ht 69.0 in | Wt 229.2 lb

## 2017-06-10 DIAGNOSIS — J0141 Acute recurrent pansinusitis: Secondary | ICD-10-CM | POA: Diagnosis not present

## 2017-06-10 MED ORDER — LORATADINE-PSEUDOEPHEDRINE ER 10-240 MG PO TB24: 1.0000 | ORAL_TABLET | Freq: Every day | ORAL | 6 refills | Status: DC

## 2017-06-10 MED ORDER — ALBUTEROL SULFATE HFA 108 (90 BASE) MCG/ACT IN AERS: 2.0000 | INHALATION_SPRAY | RESPIRATORY_TRACT | 1 refills | Status: DC | PRN

## 2017-06-10 MED ORDER — CLINDAMYCIN HCL 300 MG PO CAPS: 300.0000 mg | ORAL_CAPSULE | Freq: Three times a day (TID) | ORAL | 0 refills | Status: DC

## 2017-06-10 NOTE — Progress Notes (Signed)
OFFICE VISIT  06/10/2017   CC:  Chief Complaint  Patient presents with  . Sinus Issues   HPI:    Patient is a 56 y.o. Caucasian male who presents for "sinus issues". Recent travel to [place] and [place]---4 weeks ago started getting nasal congestion, lots of yellowish green mucous, PND, sinus pressure.  No ST, no malaise, no achiness.  No fever.  Minimal cough.  Pain around ethmoid sinus areas bilat, diffuse peri-orbital HA. Sx's waxed and waned and then have worsened for the last week.   Past Medical History:  Diagnosis Date  . Allergic rhinitis    Much improved with allergy immunotherapy (molds primarily)  . Asthma    no inhaler requirement since allergies under control with immunotherapy  . BPH (benign prostatic hypertrophy) 2014   TURP  . Cancer ([place])    skin cancer   . Chronic constipation   . Chronic sinusitis    Deviated nasal septum: >90% airway obstruction on right.  Dr. Wilburn Cornelia discussed possible septoplasty and turbinate reduction with pt in 2015.  Marland Kitchen Colon polyps   . Constipation, chronic 11/24/2011  . OSA on CPAP 2005   cpap nightly  . Personal history of colonic polyps 03/24/2012   03/2012 - 5 mm cecal adenoma  . PUD (peptic ulcer disease)    Bleeding ulcer 1978  . Relapsing remitting multiple sclerosis ([place]) 1992   Remission since 1996 on no meds.  Initial presentation was gait/balance and leg weakness sx's.  Additional relapses with blurry vision and uncoordination.  Dr. Tomi Likens 04/2017 recommended re-estab baseline MRI brain and C-spine + start disease modifying therapy but pt declined.  . Thyroid nodule 05/2012   Right lobe 28mmX14mm nodule found on screening thyroid and carotid u/s done through his employer.  Pt was euthyroid at that time.  A radioactive iodine uptake and scan was normal 06/2012.  FNA bx 04/2015 was BENIGN/CYST.    Past Surgical History:  Procedure Laterality Date  . APPENDECTOMY  1986  . COLONOSCOPY  2006, 2013    diminutive  hyperplastic cecal polyp ([place]); diminutive cecal adenoma removed 2013, recall 2018  . FEMUR FRACTURE SURGERY  1978   with rod insertion.  Rod removal 1979  . FNA R Thyroid nodule  04/17/15   Benign/cystic (Dr. Cruzita Lederer)  . INGUINAL HERNIA REPAIR Right 10/22/2015   Procedure: LAPAROSCOPIC RIGHT INGUINAL HERNIA REPAIR;  Surgeon: Arta Bruce Kinsinger, MD;  Location: [facility];  Service: General;  Laterality: Right;  . INSERTION OF MESH Right 10/22/2015   Procedure: INSERTION OF MESH;  Surgeon: Mickeal Skinner, MD;  Location[facility];  Service: General;  Laterality: Right;  . TRANSURETHRAL RESECTION OF PROSTATE N/A 08/25/2012   Procedure: TRANSURETHRAL RESECTION OF THE PROSTATE WITH GYRUS INSTRUMENTS;  Surgeon: Alexis Frock, MD;  Location: [facility];  Service: Urology;  Laterality: N/A;--patient got excellent results from procedure.    Outpatient Medications Prior to Visit  Medication Sig Dispense Refill  . aspirin EC 81 MG tablet Take 81 mg by mouth daily.    . B Complex-Folic Acid (B COMPLEX-VITAMIN B12 PO) Take 1 capsule by mouth daily.    . cholecalciferol (VITAMIN D) 1000 units tablet Take 1,000 Units by mouth daily.    . fluorouracil (EFUDEX) 5 % cream   0  . fluticasone (FLONASE) 50 MCG/ACT nasal spray 2 sprays each nostril once each morning 48 g 2  . Omega-3 Fatty Acids (FISH OIL) 1000 MG CAPS Take by mouth 2 (two) times  daily.     . polyethylene glycol (MIRALAX / GLYCOLAX) packet Take 17 g by mouth as needed.    . psyllium (METAMUCIL) 58.6 % powder Take 1 packet by mouth 3 (three) times daily.    Marland Kitchen senna (SENOKOT) 8.6 MG tablet Take 2 tablets by mouth 2 (two) times daily as needed for constipation.    . vitamin C (ASCORBIC ACID) 500 MG tablet Take 500 mg by mouth as needed.     . loratadine-pseudoephedrine (CLARITIN-D 24-HOUR) 10-240 MG 24 hr tablet Take 1 tablet by mouth daily.    Marland Kitchen albuterol (PROVENTIL HFA;VENTOLIN HFA) 108 (90 BASE) MCG/ACT  inhaler Inhale 2 puffs into the lungs every 4 (four) hours as needed for shortness of breath. (Patient not taking: Reported on 06/10/2017) 2 Inhaler 1  . loratadine-pseudoephedrine (CLARITIN-D 12-HOUR) 5-120 MG tablet Take 1 tablet by mouth 2 (two) times daily. (Patient not taking: Reported on 06/10/2017) 60 tablet 11   No facility-administered medications prior to visit.     No Known Allergies  ROS As per HPI  PE: Blood pressure (!) 152/75, pulse 71, temperature 98.2 F (36.8 C), temperature source Oral, resp. rate 16, height 5\' 9"  (1.753 m), weight 229 lb 4 oz (104 kg), SpO2 95 %. VS: noted--normal. Gen: alert, NAD, NONTOXIC APPEARING. HEENT: eyes without injection, drainage, or swelling.  Ears: EACs clear, TMs with normal light reflex and landmarks.  Nose: no rhinorrhea, he has some dried, crusty exudate adherent to mildly injected mucosa.  Septum signif deviated to the R.  No purulent d/c.  No paranasal sinus TTP.  No facial swelling.  Throat and mouth without focal lesion.  No pharyngial swelling, erythema, or exudate.  I see a bit of thick, yellow PND.   Neck: supple, no LAD.   LUNGS: CTA bilat, nonlabored resps.   CV: RRR, no m/r/g. EXT: no c/c/e SKIN: no rash  LABS:  none  IMPRESSION AND PLAN:  Recurrent sinusitis, suspect infectious--superimposed on chronic allergic rhinitis. Clindamycin 300 mg tid x 14d. Continue home symptomatic therapy.  Sent in RF's for 24h claritin D. He does sinus rinses regularly.    An After Visit Summary was printed and given to the patient.  FOLLOW UP: Return if symptoms worsen or fail to improve.  Signed:  Crissie Sickles, MD           06/10/2017

## 2017-06-16 ENCOUNTER — Encounter: Payer: BLUE CROSS/BLUE SHIELD | Admitting: Internal Medicine

## 2017-07-10 ENCOUNTER — Ambulatory Visit (AMBULATORY_SURGERY_CENTER): Payer: Self-pay | Admitting: *Deleted

## 2017-07-10 ENCOUNTER — Other Ambulatory Visit: Payer: Self-pay

## 2017-07-10 VITALS — Ht 70.0 in | Wt 225.0 lb

## 2017-07-10 DIAGNOSIS — Z86010 Personal history of colonic polyps: Secondary | ICD-10-CM

## 2017-07-10 NOTE — Progress Notes (Signed)
No egg or soy allergy known to patient  No issues with past sedation with any surgeries  or procedures, no intubation problems  No diet pills per patient No home 02 use per patient  No blood thinners per patient  Pt denies issues with constipation - at present - uses senokot, miralax, fiber and has sev stools a day that are soft and has no issues with the constipation with above products  No A fib or A flutter  EMMI video sent to pt's e mail

## 2017-07-11 DIAGNOSIS — G4733 Obstructive sleep apnea (adult) (pediatric): Secondary | ICD-10-CM | POA: Diagnosis not present

## 2017-07-23 ENCOUNTER — Other Ambulatory Visit: Payer: Self-pay | Admitting: Family Medicine

## 2017-07-23 NOTE — Telephone Encounter (Signed)
Copied from [place] [phone]. Topic: Quick Communication - Rx Refill/Question >> Jul 23, 2017  4:20 PM Oliver Pila B wrote: Medication: loratadine-pseudoephedrine (CLARITIN-D 24-HOUR) 10-240 MG 24 hr tablet [[id number]]  Has the patient contacted their pharmacy? yes  Pt is needing the Rx and is contacting pharmacy; contact pt to advise

## 2017-07-24 ENCOUNTER — Encounter: Payer: Self-pay | Admitting: Internal Medicine

## 2017-07-24 ENCOUNTER — Other Ambulatory Visit: Payer: Self-pay

## 2017-07-24 ENCOUNTER — Ambulatory Visit (AMBULATORY_SURGERY_CENTER): Payer: BLUE CROSS/BLUE SHIELD | Admitting: Internal Medicine

## 2017-07-24 VITALS — BP 154/95 | HR 68 | Temp 98.4°F | Resp 14 | Ht 70.0 in | Wt 225.0 lb

## 2017-07-24 DIAGNOSIS — D128 Benign neoplasm of rectum: Secondary | ICD-10-CM | POA: Diagnosis not present

## 2017-07-24 DIAGNOSIS — Z86010 Personal history of colonic polyps: Secondary | ICD-10-CM | POA: Diagnosis not present

## 2017-07-24 MED ORDER — SODIUM CHLORIDE 0.9 % IV SOLN: 500.0000 mL | Freq: Once | INTRAVENOUS | Status: DC

## 2017-07-24 NOTE — Progress Notes (Signed)
Called to room to assist during endoscopic procedure.  Patient ID and intended procedure confirmed with present staff. Received instructions for my participation in the procedure from the performing physician.  

## 2017-07-24 NOTE — Patient Instructions (Addendum)
    I found and removed one tiny polyp. I will let you know pathology results and when to have another routine colonoscopy by mail and/or My Chart.  I appreciate the opportunity to care for you. Gatha Mayer, MD, [facility]  *Handout given to patient on polyps**  YOU HAD AN ENDOSCOPIC PROCEDURE TODAY AT [place]:   Refer to the procedure report that was given to you for any specific questions about what was found during the examination.  If the procedure report does not answer your questions, please call your gastroenterologist to clarify.  If you requested that your care partner not be given the details of your procedure findings, then the procedure report has been included in a sealed envelope for you to review at your convenience later.  YOU SHOULD EXPECT: Some feelings of bloating in the abdomen. Passage of more gas than usual.  Walking can help get rid of the air that was put into your GI tract during the procedure and reduce the bloating. If you had a lower endoscopy (such as a colonoscopy or flexible sigmoidoscopy) you may notice spotting of blood in your stool or on the toilet paper. If you underwent a bowel prep for your procedure, you may not have a normal bowel movement for a few days.  Please Note:  You might notice some irritation and congestion in your nose or some drainage.  This is from the oxygen used during your procedure.  There is no need for concern and it should clear up in a day or so.  SYMPTOMS TO REPORT IMMEDIATELY:   Following lower endoscopy (colonoscopy or flexible sigmoidoscopy):  Excessive amounts of blood in the stool  Significant tenderness or worsening of abdominal pains  Swelling of the abdomen that is new, acute  Fever of 100F or higher   For urgent or emergent issues, a gastroenterologist can be reached at any hour by calling [phone].   DIET:  We do recommend a small meal at first, but then you may proceed to your regular  diet.  Drink plenty of fluids but you should avoid alcoholic beverages for 24 hours.  ACTIVITY:  You should plan to take it easy for the rest of today and you should NOT DRIVE or use heavy machinery until tomorrow (because of the sedation medicines used during the test).    FOLLOW UP: Our staff will call the number listed on your records the next business day following your procedure to check on you and address any questions or concerns that you may have regarding the information given to you following your procedure. If we do not reach you, we will leave a message.  However, if you are feeling well and you are not experiencing any problems, there is no need to return our call.  We will assume that you have returned to your regular daily activities without incident.  If any biopsies were taken you will be contacted by phone or by letter within the next 1-3 weeks.  Please call us at [phone] if you have not heard about the biopsies in 3 weeks.    SIGNATURES/CONFIDENTIALITY: You and/or your care partner have signed paperwork which will be entered into your electronic medical record.  These signatures attest to the fact that that the information above on your After Visit Summary has been reviewed and is understood.  Full responsibility of the confidentiality of this discharge information lies with you and/or your care-partner.

## 2017-07-24 NOTE — Telephone Encounter (Signed)
Pt should have RF's on file with pharmacy. Called [facility], pt picked up Rx yesterday.

## 2017-07-24 NOTE — Progress Notes (Signed)
To recovery, report to RN, VSS. 

## 2017-07-24 NOTE — Op Note (Signed)
[place] Patient Name: Brian Walsh Procedure Date: 07/24/2017 7:55 AM MRN: [id number] Endoscopist: Gatha Mayer , MD Age: 57 Referring MD:  Date of Birth: 20-Apr-1961 Gender: Male Account #: [id number] Procedure:                Colonoscopy Indications:              Surveillance: Personal history of adenomatous                            polyps on last colonoscopy 5 years ago Medicines:                Propofol per Anesthesia, Monitored Anesthesia Care Procedure:                Pre-Anesthesia Assessment:                           - Prior to the procedure, a History and Physical                            was performed, and patient medications and                            allergies were reviewed. The patient's tolerance of                            previous anesthesia was also reviewed. The risks                            and benefits of the procedure and the sedation                            options and risks were discussed with the patient.                            All questions were answered, and informed consent                            was obtained. Prior Anticoagulants: The patient has                            taken no previous anticoagulant or antiplatelet                            agents. ASA Grade Assessment: II - A patient with                            mild systemic disease. After reviewing the risks                            and benefits, the patient was deemed in                            satisfactory condition to undergo the procedure.  After obtaining informed consent, the colonoscope                            was passed under direct vision. Throughout the                            procedure, the patient's blood pressure, pulse, and                            oxygen saturations were monitored continuously. The                            Colonoscope was introduced through the anus and   advanced to the the cecum, identified by                            appendiceal orifice and ileocecal valve. The                            colonoscopy was performed without difficulty. The                            patient tolerated the procedure well. The quality                            of the bowel preparation was good. The ileocecal                            valve, appendiceal orifice, and rectum were                            photographed. The bowel preparation used was                            Miralax. Scope In: 8:08:35 AM Scope Out: 8:31:28 AM Scope Withdrawal Time: 0 hours 17 minutes 16 seconds  Total Procedure Duration: 0 hours 22 minutes 53 seconds  Findings:                 The perianal and digital rectal examinations were                            normal.                           A diminutive polyp was found in the rectum. The                            polyp was sessile. The polyp was removed with a                            cold snare. Resection and retrieval were complete.                            Verification of patient identification for the  specimen was done. Estimated blood loss was minimal.                           A diffuse area of mildly melanotic mucosa was found                            in the entire colon.                           The exam was otherwise without abnormality on                            direct and retroflexion views. Complications:            No immediate complications. Estimated Blood Loss:     Estimated blood loss was minimal. Impression:               - One diminutive polyp in the rectum, removed with                            a cold snare. Resected and retrieved.                           - Melanotic mucosa in the entire examined colon.                           - The examination was otherwise normal on direct                            and retroflexion views.                           - Personal  history of colonic polyps. Last was                            diminutive adenoma 2013 Recommendation:           - Patient has a contact number available for                            emergencies. The signs and symptoms of potential                            delayed complications were discussed with the                            patient. Return to normal activities tomorrow.                            Written discharge instructions were provided to the                            patient.                           - Resume previous diet.                           -  Continue present medications.                           - Repeat colonoscopy is recommended for                            surveillance. The colonoscopy date will be                            determined after pathology results from today's                            exam become available for review. Gatha Mayer, MD 07/24/2017 8:44:29 AM This report has been signed electronically.

## 2017-07-24 NOTE — Progress Notes (Signed)
Patient consents to observer being present for procedure.   No changes in medical or surgical hx since PV per pt

## 2017-07-27 ENCOUNTER — Encounter: Payer: Self-pay | Admitting: Family Medicine

## 2017-07-27 ENCOUNTER — Telehealth: Payer: Self-pay | Admitting: *Deleted

## 2017-07-27 NOTE — Telephone Encounter (Signed)
  Follow up Call-  Call back number 07/24/2017  Post procedure Call Back phone  # [phone]  Permission to leave phone message Yes  Some recent data might be hidden     Patient questions:  Do you have a fever, pain , or abdominal swelling? No. Pain Score  0 *  Have you tolerated food without any problems? Yes.    Have you been able to return to your normal activities? Yes.    Do you have any questions about your discharge instructions: Diet   No. Medications  No. Follow up visit  No.  Do you have questions or concerns about your Care? No.  Actions: * If pain score is 4 or above: No action needed, pain <4.

## 2017-07-28 ENCOUNTER — Encounter: Payer: Self-pay | Admitting: Internal Medicine

## 2017-07-28 NOTE — Progress Notes (Signed)
1 diminutive adenoma Recall 2024 My Chart letter

## 2017-08-01 ENCOUNTER — Encounter: Payer: Self-pay | Admitting: Family Medicine

## 2017-08-17 ENCOUNTER — Encounter: Payer: Self-pay | Admitting: Family Medicine

## 2017-08-17 ENCOUNTER — Ambulatory Visit (INDEPENDENT_AMBULATORY_CARE_PROVIDER_SITE_OTHER): Payer: BLUE CROSS/BLUE SHIELD | Admitting: Family Medicine

## 2017-08-17 VITALS — BP 132/74 | HR 65 | Temp 98.2°F | Ht 70.0 in | Wt 236.0 lb

## 2017-08-17 DIAGNOSIS — J0110 Acute frontal sinusitis, unspecified: Secondary | ICD-10-CM | POA: Diagnosis not present

## 2017-08-17 MED ORDER — AMOXICILLIN-POT CLAVULANATE 875-125 MG PO TABS: 1.0000 | ORAL_TABLET | Freq: Two times a day (BID) | ORAL | 0 refills | Status: AC

## 2017-08-17 NOTE — Progress Notes (Signed)
Brian Walsh - 57 y.o. male MRN [id number]  Date of birth: December 19, 1960  SUBJECTIVE:  Including CC & ROS.  Chief Complaint  Patient presents with  . Sinus Problem    Brian Walsh is a 57 y.o. male that is presenting with sinus pressure. Symptoms started a week and a half ago. He has been having a lot of drainage. Admits to fevers, body aches and chills. He took some over the counter sinus medication with no improvement. Has been using claritin d and netti pot. Feels like it did back in November. Reports having a deviated septum that predisposes him to sinus infections.      Review of Systems  Constitutional: Positive for fever.  HENT: Positive for sinus pressure and sinus pain.   Respiratory: Negative for shortness of breath.   Cardiovascular: Negative for chest pain.  Gastrointestinal: Negative for abdominal pain.  Musculoskeletal: Negative for back pain.  Skin: Negative for color change.  Hematological: Negative for adenopathy.  Psychiatric/Behavioral: Negative for agitation.    HISTORY: Past Medical, Surgical, Social, and Family History Reviewed & Updated per EMR.   Pertinent Historical Findings include:  Past Medical History:  Diagnosis Date  . Allergic rhinitis    Much improved with allergy immunotherapy (molds primarily)  . Allergy   . Asthma    no inhaler requirement since allergies under control with immunotherapy  . Blood transfusion without reported diagnosis   . BPH (benign prostatic hypertrophy) 2014   TURP  . Cancer ([place])    skin cancer   . Chronic constipation   . Chronic sinusitis    Deviated nasal septum: >90% airway obstruction on right.  Dr. Wilburn Cornelia discussed possible septoplasty and turbinate reduction with pt in 2015.  Marland Kitchen Colon polyps   . Constipation, chronic 11/24/2011  . H/O blood clots    in eye   . History of adenomatous polyp of colon    Most recent colonoscopy 07/2017---recall 5 yrs.  . Neuromuscular disorder ([place])    MS  . OSA on CPAP 2005    cpap nightly  . Personal history of colonic polyps 03/24/2012   03/2012 - 5 mm cecal adenoma  . PUD (peptic ulcer disease)    Bleeding ulcer 1978  . Relapsing remitting multiple sclerosis ([place]) 1992   Remission since 1996 on no meds.  Initial presentation was gait/balance and leg weakness sx's.  Additional relapses with blurry vision and uncoordination.  Dr. Tomi Likens 04/2017 recommended re-estab baseline MRI brain and C-spine + start disease modifying therapy but pt declined.  . Sleep apnea    wears cpap   . Thyroid nodule 05/2012   Right lobe 75mmX14mm nodule found on screening thyroid and carotid u/s done through his employer.  Pt was euthyroid at that time.  A radioactive iodine uptake and scan was normal 06/2012.  FNA bx 04/2015 was BENIGN/CYST.    Past Surgical History:  Procedure Laterality Date  . APPENDECTOMY  1986  . COLONOSCOPY  2006, 2013; 07/2017   diminutive hyperplastic cecal polyp [facility]); diminutive cecal adenoma removed 2013.  Repeat 07/24/17, one polyp--recall 5 yrs.  Marland Kitchen FEMUR FRACTURE SURGERY  1978   with rod insertion.  Rod removal 1979  . FNA R Thyroid nodule  04/17/15   Benign/cystic (Dr. Cruzita Lederer)  . INGUINAL HERNIA REPAIR Right 10/22/2015   Procedure: LAPAROSCOPIC RIGHT INGUINAL HERNIA REPAIR;  Surgeon: Arta Bruce Kinsinger, MD;  Location: [facility];  Service: General;  Laterality: Right;  . INSERTION OF MESH Right 10/22/2015  Procedure: INSERTION OF MESH;  Surgeon: Mickeal Skinner, MD;  Location: [facility];  Service: General;  Laterality: Right;  . TRANSURETHRAL RESECTION OF PROSTATE N/A 08/25/2012   Procedure: TRANSURETHRAL RESECTION OF THE PROSTATE WITH GYRUS INSTRUMENTS;  Surgeon: Alexis Frock, MD;  Location: [facility];  Service: Urology;  Laterality: N/A;--patient got excellent results from procedure.    No Known Allergies  Family History  Problem Relation Age of Onset  . Parkinsonism Father   . Stroke Father   . Colon  polyps Father 25  . Breast cancer Mother   . Colon cancer Neg Hx   . Esophageal cancer Neg Hx   . Rectal cancer Neg Hx   . Stomach cancer Neg Hx      Social History   Socioeconomic History  . Marital status: Married    Spouse name: Not on file  . Number of children: 1  . Years of education: Not on file  . Highest education level: Not on file  Social Needs  . Financial resource strain: Not on file  . Food insecurity - worry: Not on file  . Food insecurity - inability: Not on file  . Transportation needs - medical: Not on file  . Transportation needs - non-medical: Not on file  Occupational History  . 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 Smoking status: Never Smoker  . Smokeless tobacco: Never Used  Substance and Sexual Activity  . Alcohol use: Yes    Alcohol/week: 0.6 oz    Types: 1 Glasses of wine per week    Comment: occasional wine   . Drug use: No  . Sexual activity: Not on file  Other Topics Concern  . Not on file  Social History Narrative   Married, has one 56 y/o son.   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   JD and MBA from [place].   No tobacco, 3-4 glasses of red wine per week, no drug use.   No exercise.   [place], plays tennis, works out at gym 5 days a week,. 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     PHYSICAL EXAM:  VS: BP 132/74 (BP Location: Left Arm, Patient Position: Sitting, Cuff Size: Normal)   Pulse 65   Temp 98.2 F (36.8 C) (Oral)   Ht 5\' 10"  (1.778 m)   Wt 236 lb (107 kg)   SpO2 98%   BMI 33.86 kg/m  Physical Exam Gen: NAD, alert, cooperative with exam,  ENT: normal lips, normal nasal mucosa, tympanic membranes clear and intact bilaterally, normal oropharynx, no cervical lymphadenopathy Eye: normal EOM, normal conjunctiva and lids CV:  no edema, +2 pedal pulses, regular rate and rhythm, S1-S2   Resp: no accessory muscle use, non-labored, clear to  auscultation bilaterally, no crackles or wheezes Skin: no rashes, no areas of induration  Neuro: normal tone, normal sensation to touch Psych:  normal insight, alert and oriented MSK: Normal gait, normal strength       ASSESSMENT & PLAN:   Acute non-recurrent frontal sinusitis Reports his symptoms are similar to his episode in November. His symptoms resolved with ABX at that time.  - Augmentin  - counseled on supportive care - f/u PRN

## 2017-08-17 NOTE — Assessment & Plan Note (Signed)
Reports his symptoms are similar to his episode in November. His symptoms resolved with ABX at that time.  - Augmentin  - counseled on supportive care - f/u PRN

## 2017-08-17 NOTE — Patient Instructions (Signed)
Please try things such as zyrtec-D or allegra-D which is an antihistamine and decongestant.   Please try afrin which will help with nasal congestion but use for only three days.   Please also try using a netti pot on a regular occasion.  Honey can help with a sore throat.

## 2017-10-07 DIAGNOSIS — L821 Other seborrheic keratosis: Secondary | ICD-10-CM | POA: Diagnosis not present

## 2017-10-07 DIAGNOSIS — L570 Actinic keratosis: Secondary | ICD-10-CM | POA: Diagnosis not present

## 2017-10-07 DIAGNOSIS — D2272 Melanocytic nevi of left lower limb, including hip: Secondary | ICD-10-CM | POA: Diagnosis not present

## 2017-10-07 DIAGNOSIS — L820 Inflamed seborrheic keratosis: Secondary | ICD-10-CM | POA: Diagnosis not present

## 2017-10-07 DIAGNOSIS — D225 Melanocytic nevi of trunk: Secondary | ICD-10-CM | POA: Diagnosis not present

## 2017-10-07 DIAGNOSIS — D485 Neoplasm of uncertain behavior of skin: Secondary | ICD-10-CM | POA: Diagnosis not present

## 2017-10-07 DIAGNOSIS — D1801 Hemangioma of skin and subcutaneous tissue: Secondary | ICD-10-CM | POA: Diagnosis not present

## 2017-10-08 DIAGNOSIS — R1031 Right lower quadrant pain: Secondary | ICD-10-CM | POA: Diagnosis not present

## 2017-10-10 DIAGNOSIS — G4733 Obstructive sleep apnea (adult) (pediatric): Secondary | ICD-10-CM | POA: Diagnosis not present

## 2018-01-08 ENCOUNTER — Ambulatory Visit (INDEPENDENT_AMBULATORY_CARE_PROVIDER_SITE_OTHER): Payer: BLUE CROSS/BLUE SHIELD | Admitting: Family Medicine

## 2018-01-08 ENCOUNTER — Encounter: Payer: Self-pay | Admitting: Family Medicine

## 2018-01-08 VITALS — BP 138/79 | HR 59 | Temp 97.7°F | Resp 16 | Ht 69.5 in | Wt 229.1 lb

## 2018-01-08 DIAGNOSIS — Z0000 Encounter for general adult medical examination without abnormal findings: Secondary | ICD-10-CM

## 2018-01-08 DIAGNOSIS — E669 Obesity, unspecified: Secondary | ICD-10-CM | POA: Diagnosis not present

## 2018-01-08 DIAGNOSIS — Z125 Encounter for screening for malignant neoplasm of prostate: Secondary | ICD-10-CM | POA: Diagnosis not present

## 2018-01-08 LAB — CBC WITH DIFFERENTIAL/PLATELET
BASOS ABS: 0 10*3/uL (ref 0.0–0.1)
Basophils Relative: 0.8 % (ref 0.0–3.0)
EOS ABS: 0.2 10*3/uL (ref 0.0–0.7)
Eosinophils Relative: 3 % (ref 0.0–5.0)
HCT: 43.6 % (ref 39.0–52.0)
Hemoglobin: 14.7 g/dL (ref 13.0–17.0)
Lymphocytes Relative: 33.1 % (ref 12.0–46.0)
Lymphs Abs: 1.7 10*3/uL (ref 0.7–4.0)
MCHC: 33.8 g/dL (ref 30.0–36.0)
MCV: 92 fl (ref 78.0–100.0)
MONO ABS: 0.4 10*3/uL (ref 0.1–1.0)
Monocytes Relative: 8.3 % (ref 3.0–12.0)
NEUTROS PCT: 54.8 % (ref 43.0–77.0)
Neutro Abs: 2.8 10*3/uL (ref 1.4–7.7)
Platelets: 276 10*3/uL (ref 150.0–400.0)
RBC: 4.74 Mil/uL (ref 4.22–5.81)
RDW: 14.6 % (ref 11.5–15.5)
WBC: 5.2 10*3/uL (ref 4.0–10.5)

## 2018-01-08 LAB — COMPREHENSIVE METABOLIC PANEL
ALT: 31 U/L (ref 0–53)
AST: 18 U/L (ref 0–37)
Albumin: 4.4 g/dL (ref 3.5–5.2)
Alkaline Phosphatase: 66 U/L (ref 39–117)
BUN: 13 mg/dL (ref 6–23)
CALCIUM: 9.6 mg/dL (ref 8.4–10.5)
CHLORIDE: 103 meq/L (ref 96–112)
CO2: 29 mEq/L (ref 19–32)
CREATININE: 0.98 mg/dL (ref 0.40–1.50)
GFR: 83.74 mL/min (ref >60.00)
Glucose, Bld: 105 mg/dL — ABNORMAL HIGH (ref 70–99)
POTASSIUM: 4.4 meq/L (ref 3.5–5.1)
Sodium: 138 mEq/L (ref 135–145)
Total Bilirubin: 0.5 mg/dL (ref 0.2–1.2)
Total Protein: 7 g/dL (ref 6.0–8.3)

## 2018-01-08 LAB — LIPID PANEL
Cholesterol: 181 mg/dL (ref 0–200)
HDL: 58.1 mg/dL (ref >39.00)
LDL Cholesterol: 107 mg/dL — ABNORMAL HIGH (ref 0–99)
NonHDL: 123.26
TRIGLYCERIDES: 80 mg/dL (ref 0.0–149.0)
Total CHOL/HDL Ratio: 3
VLDL: 16 mg/dL (ref 0.0–40.0)

## 2018-01-08 LAB — TSH: TSH: 1.36 u[IU]/mL (ref 0.35–4.50)

## 2018-01-08 LAB — PSA: PSA: 0.65 ng/mL (ref 0.10–4.00)

## 2018-01-08 NOTE — Progress Notes (Signed)
Office Note 01/08/2018  CC:  Chief Complaint  Patient presents with  . Annual Exam    Pt is fasting.    HPI:  Brian Walsh is a 57 y.o. White male who is here for annual health maintenance exam. Overall doing well, pretty tired--?not enough sleep? Stressed a lot this year. Exercising frequently at the gym. Working on improving diet.  BP's: 130s/70-80s consistently at home.  Past Medical History:  Diagnosis Date  . Allergic rhinitis    Much improved with allergy immunotherapy (molds primarily)  . Allergy   . Asthma    no inhaler requirement since allergies under control with immunotherapy  . Blood transfusion without reported diagnosis   . BPH (benign prostatic hypertrophy) 2014   TURP  . Cancer ([place])    skin cancer   . Chronic constipation   . Chronic sinusitis    Deviated nasal septum: >90% airway obstruction on right.  Dr. Wilburn Cornelia discussed possible septoplasty and turbinate reduction with pt in 2015.  Marland Kitchen Colon polyps   . Constipation, chronic 11/24/2011  . H/O blood clots    in eye   . History of adenomatous polyp of colon    Most recent colonoscopy 07/2017---recall 5 yrs.  . Neuromuscular disorder ([place])    MS  . OSA on CPAP 2005   cpap nightly  . Personal history of colonic polyps 03/24/2012   03/2012 - 5 mm cecal adenoma  . PUD (peptic ulcer disease)    Bleeding ulcer 1978  . Relapsing remitting multiple sclerosis ([place]) 1992   Remission since 1996 on no meds.  Initial presentation was gait/balance and leg weakness sx's.  Additional relapses with blurry vision and uncoordination.  Dr. Tomi Likens 04/2017 recommended re-estab baseline MRI brain and C-spine + start disease modifying therapy but pt declined.  . Sleep apnea    wears cpap   . Thyroid nodule 05/2012   Right lobe 58mmX14mm nodule found on screening thyroid and carotid u/s done through his employer.  Pt was euthyroid at that time.  A radioactive iodine uptake and scan was normal 06/2012.  FNA bx 04/2015  was BENIGN/CYST.    Past Surgical History:  Procedure Laterality Date  . APPENDECTOMY  1986  . COLONOSCOPY  2006, 2013; 07/2017   diminutive hyperplastic cecal polyp [facility]); diminutive cecal adenoma removed 2013.  Repeat 07/24/17, one polyp--recall 5 yrs.  Marland Kitchen FEMUR FRACTURE SURGERY  1978   with rod insertion.  Rod removal 1979  . FNA R Thyroid nodule  04/17/15   Benign/cystic (Dr. Cruzita Lederer)  . INGUINAL HERNIA REPAIR Right 10/22/2015   Procedure: LAPAROSCOPIC RIGHT INGUINAL HERNIA REPAIR;  Surgeon: Arta Bruce Kinsinger, MD;  Location: [facility];  Service: General;  Laterality: Right;  . INSERTION OF MESH Right 10/22/2015   Procedure: INSERTION OF MESH;  Surgeon: Mickeal Skinner, MD;  Location: [facility];  Service: General;  Laterality: Right;  . TRANSURETHRAL RESECTION OF PROSTATE N/A 08/25/2012   Procedure: TRANSURETHRAL RESECTION OF THE PROSTATE WITH GYRUS INSTRUMENTS;  Surgeon: Alexis Frock, MD;  [facility];  Service: Urology;  Laterality: N/A;--patient got excellent results from procedure.    Family History  Problem Relation Age of Onset  . Parkinsonism Father   . Stroke Father   . Colon polyps Father 28  . Breast cancer Mother   . Colon cancer Neg Hx   . Esophageal cancer Neg Hx   . Rectal cancer Neg Hx   . Stomach cancer Neg Hx  Social History   Socioeconomic History  . Marital status: Married    Spouse name: Not on file  . Number of children: 1  . Years of education: Not on file  . Highest education level: Not on file  Occupational History  . 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 Financial resource strain: Not on file  . Food insecurity:    Worry: Not on file    Inability: Not on file  . Transportation needs:    Medical: Not on file    Non-medical: Not on file  Tobacco Use  . Smoking status: Never Smoker  . Smokeless tobacco: Never Used  Substance and Sexual Activity  . Alcohol  use: Yes    Alcohol/week: 0.6 oz    Types: 1 Glasses of wine per week    Comment: occasional wine   . Drug use: No  . Sexual activity: Not on file  Lifestyle  . Physical activity:    Days per week: Not on file    Minutes per session: Not on file  . Stress: Not on file  Relationships  . Social connections:    Talks on phone: Not on file    Gets together: Not on file    Attends religious service: Not on file    Active member of club or organization: Not on file    Attends meetings of clubs or organizations: Not on file    Relationship status: Not on file  . Intimate partner violence:    Fear of current or ex partner: Not on file    Emotionally abused: Not on file    Physically abused: Not on file    Forced sexual activity: Not on file  Other Topics Concern  . Not on file  Social History Narrative   Married, has one 33 y/o son.   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   JD and MBA from [place].   No tobacco, 3-4 glasses of red wine per week, no drug use.   No exercise.   [place], plays tennis, works out at gym 5 days a week,. 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 albuterol (PROVENTIL HFA;VENTOLIN HFA) 108 (90 Base) MCG/ACT inhaler Inhale 2 puffs into the lungs every 4 (four) hours as needed for shortness of breath. 1 Inhaler 1  . aspirin EC 81 MG tablet Take 81 mg by mouth every other day.     . B Complex-Folic Acid (B COMPLEX-VITAMIN B12 PO) Take 1 capsule by mouth daily.    . Calcium-Magnesium-Zinc 167-83-8 MG TABS Take by mouth 2 (two) times daily after a meal.    . cholecalciferol (VITAMIN D) 1000 units tablet Take 1,000 Units by mouth 2 (two) times daily.     . fluorouracil (EFUDEX) 5 % cream Apply 1 application topically daily as needed.   0  . fluticasone (FLONASE) 50 MCG/ACT nasal spray 2 sprays each nostril once each morning 48 g 2  .  loratadine-pseudoephedrine (CLARITIN-D 24-HOUR) 10-240 MG 24 hr tablet Take 1 tablet by mouth daily. 30 tablet 6  . Omega-3 Fatty Acids (FISH OIL) 1000 MG CAPS Take by mouth 2 (two) times daily.     . psyllium (METAMUCIL) 58.6 % powder Take 1 packet by mouth 2 (two) times daily.     Marland Kitchen senna (SENOKOT) 8.6 MG tablet Take 2  tablets by mouth 2 (two) times daily as needed for constipation.    . vitamin C (ASCORBIC ACID) 500 MG tablet Take 500 mg by mouth as needed.      No facility-administered medications prior to visit.     No Known Allergies  ROS Review of Systems  Constitutional: Negative for appetite change, chills, fatigue and fever.  HENT: Negative for congestion, dental problem, ear pain and sore throat.   Eyes: Negative for discharge, redness and visual disturbance.  Respiratory: Negative for cough, chest tightness, shortness of breath and wheezing.   Cardiovascular: Negative for chest pain, palpitations and leg swelling.  Gastrointestinal: Negative for abdominal pain, blood in stool, diarrhea, nausea and vomiting.  Genitourinary: Negative for difficulty urinating, dysuria, flank pain, frequency, hematuria and urgency.  Musculoskeletal: Negative for arthralgias, back pain, joint swelling, myalgias and neck stiffness.  Skin: Negative for pallor and rash.  Neurological: Negative for dizziness, speech difficulty, weakness and headaches.  Hematological: Negative for adenopathy. Does not bruise/bleed easily.  Psychiatric/Behavioral: Negative for confusion and sleep disturbance. The patient is not nervous/anxious.     PE; Blood pressure 138/79, pulse (!) 59, temperature 97.7 F (36.5 C), temperature source Oral, resp. rate 16, height 5' 9.5" (1.765 m), weight 229 lb 2 oz (103.9 kg), SpO2 97 %. Body mass index is 33.35 kg/m.  Gen: Alert, well appearing.  Patient is oriented to person, place, time, and situation. AFFECT: pleasant, lucid thought and speech. ENT: Ears: EACs clear, normal  epithelium.  TMs with good light reflex and landmarks bilaterally.  Eyes: no injection, icteris, swelling, or exudate.  EOMI, PERRLA. Nose: no drainage or turbinate edema/swelling.  No injection or focal lesion.  Mouth: lips without lesion/swelling.  Oral mucosa pink and moist.  Dentition intact and without obvious caries or gingival swelling.  Oropharynx without erythema, exudate, or swelling.  Neck: supple/nontender.  No LAD, mass, or TM.  Carotid pulses 2+ bilaterally, without bruits. CV: RRR, no m/r/g.   LUNGS: CTA bilat, nonlabored resps, good aeration in all lung fields. ABD: soft, NT, ND, BS normal.  No hepatospenomegaly or mass.  No bruits. EXT: no clubbing or cyanosis.  He has 2 + bilat LL edema--chronic. Musculoskeletal: no joint swelling, erythema, warmth, or tenderness.  ROM of all joints intact. Skin - no sores or suspicious lesions or rashes or color changes Rectal exam: negative without mass, lesions or tenderness, PROSTATE EXAM: smooth and symmetric without nodules or tenderness.   Pertinent labs:  Lab Results  Component Value Date   TSH 1.05 07/03/2014   Lab Results  Component Value Date   WBC 5.6 02/28/2015   HGB 14.8 10/22/2015   HCT 44.4 02/28/2015   MCV 90.7 02/28/2015   PLT 253.0 02/28/2015   Lab Results  Component Value Date   CREATININE 1.02 02/28/2015   BUN 14 02/28/2015   NA 139 02/28/2015   K 4.3 02/28/2015   CL 104 02/28/2015   CO2 30 02/28/2015   Lab Results  Component Value Date   ALT 32 02/28/2015   AST 20 02/28/2015   ALKPHOS 55 02/28/2015   BILITOT 0.4 02/28/2015   No results found for: CHOL No results found for: HDL No results found for: LDLCALC No results found for: TRIG No results found for: CHOLHDL Lab Results  Component Value Date   PSA 0.65 01/02/2017   PSA 1.73 06/12/2011    ASSESSMENT AND PLAN:   Health maintenance exam: Reviewed age and gender appropriate health maintenance issues (prudent diet, regular exercise,  health  risks of tobacco and excessive alcohol, use of seatbelts, fire alarms in home, use of sunscreen).  Also reviewed age and gender appropriate health screening as well as vaccine recommendations. Vaccines: all UTD, including shingrix. Labs: fasting HP + PSA. Prostate ca screening: DRE normal today , PSA. Colon ca screening: next colonoscopy 2024. Pt gets q35mo skin screening exams with [place] derm.  An After Visit Summary was printed and given to the patient.  FOLLOW UP:  Return in about 1 year (around 01/09/2019) for annual CPE (fasting).  Signed:  Crissie Sickles, MD           01/08/2018

## 2018-01-08 NOTE — Patient Instructions (Signed)

## 2018-02-15 ENCOUNTER — Other Ambulatory Visit: Payer: Self-pay | Admitting: Family Medicine

## 2018-04-14 DIAGNOSIS — L570 Actinic keratosis: Secondary | ICD-10-CM | POA: Diagnosis not present

## 2018-04-14 DIAGNOSIS — D2262 Melanocytic nevi of left upper limb, including shoulder: Secondary | ICD-10-CM | POA: Diagnosis not present

## 2018-04-14 DIAGNOSIS — L814 Other melanin hyperpigmentation: Secondary | ICD-10-CM | POA: Diagnosis not present

## 2018-04-14 DIAGNOSIS — D0421 Carcinoma in situ of skin of right ear and external auricular canal: Secondary | ICD-10-CM | POA: Diagnosis not present

## 2018-04-14 DIAGNOSIS — D225 Melanocytic nevi of trunk: Secondary | ICD-10-CM | POA: Diagnosis not present

## 2018-04-14 DIAGNOSIS — D2261 Melanocytic nevi of right upper limb, including shoulder: Secondary | ICD-10-CM | POA: Diagnosis not present

## 2018-04-16 DIAGNOSIS — G4733 Obstructive sleep apnea (adult) (pediatric): Secondary | ICD-10-CM | POA: Diagnosis not present

## 2018-04-28 DIAGNOSIS — D0421 Carcinoma in situ of skin of right ear and external auricular canal: Secondary | ICD-10-CM | POA: Diagnosis not present

## 2018-09-06 DIAGNOSIS — Z713 Dietary counseling and surveillance: Secondary | ICD-10-CM | POA: Diagnosis not present

## 2018-09-08 ENCOUNTER — Other Ambulatory Visit: Payer: Self-pay | Admitting: Family Medicine

## 2018-10-28 DIAGNOSIS — Z713 Dietary counseling and surveillance: Secondary | ICD-10-CM | POA: Diagnosis not present

## 2018-12-16 DIAGNOSIS — Z713 Dietary counseling and surveillance: Secondary | ICD-10-CM | POA: Diagnosis not present

## 2018-12-29 ENCOUNTER — Encounter: Payer: Self-pay | Admitting: Family Medicine

## 2018-12-29 DIAGNOSIS — G4733 Obstructive sleep apnea (adult) (pediatric): Secondary | ICD-10-CM | POA: Diagnosis not present

## 2018-12-29 LAB — LIPID PANEL
Cholesterol: 183 (ref 0–200)
HDL: 56 (ref 35–70)
LDL Cholesterol: 103
Triglycerides: 113 (ref 40–160)

## 2018-12-29 LAB — HEPATIC FUNCTION PANEL
ALT: 25 (ref 10–40)
AST: 18 (ref 14–40)
Alkaline Phosphatase: 80 (ref 25–125)
Bilirubin, Total: 0.3

## 2018-12-29 LAB — BASIC METABOLIC PANEL
BUN: 11 (ref 4–21)
Creatinine: 0.9 (ref 0.6–1.3)
Glucose: 113

## 2018-12-29 LAB — HM HIV SCREENING LAB: HM HIV Screening: NEGATIVE

## 2018-12-29 LAB — HEMOGLOBIN A1C: Hemoglobin A1C: 5.8

## 2018-12-31 ENCOUNTER — Encounter: Payer: Self-pay | Admitting: Family Medicine

## 2018-12-31 DIAGNOSIS — D1801 Hemangioma of skin and subcutaneous tissue: Secondary | ICD-10-CM | POA: Diagnosis not present

## 2018-12-31 DIAGNOSIS — L821 Other seborrheic keratosis: Secondary | ICD-10-CM | POA: Diagnosis not present

## 2018-12-31 DIAGNOSIS — Z85828 Personal history of other malignant neoplasm of skin: Secondary | ICD-10-CM | POA: Diagnosis not present

## 2018-12-31 DIAGNOSIS — D0422 Carcinoma in situ of skin of left ear and external auricular canal: Secondary | ICD-10-CM | POA: Diagnosis not present

## 2019-01-18 DIAGNOSIS — D0422 Carcinoma in situ of skin of left ear and external auricular canal: Secondary | ICD-10-CM | POA: Diagnosis not present

## 2019-02-04 ENCOUNTER — Other Ambulatory Visit: Payer: Self-pay | Admitting: Family Medicine

## 2019-02-07 ENCOUNTER — Encounter: Payer: Self-pay | Admitting: Family Medicine

## 2019-02-08 ENCOUNTER — Telehealth: Payer: Self-pay | Admitting: Family Medicine

## 2019-02-08 NOTE — Telephone Encounter (Signed)
Patient is checking to see if we have the results [facility] for his 02/09/19 appointment. All of his vitals on the form.

## 2019-02-08 NOTE — Telephone Encounter (Signed)
Pt was advised that results were received.

## 2019-02-09 ENCOUNTER — Encounter: Payer: Self-pay | Admitting: Family Medicine

## 2019-02-09 ENCOUNTER — Ambulatory Visit (INDEPENDENT_AMBULATORY_CARE_PROVIDER_SITE_OTHER): Payer: BC Managed Care – PPO | Admitting: Family Medicine

## 2019-02-09 ENCOUNTER — Other Ambulatory Visit: Payer: Self-pay

## 2019-02-09 VITALS — BP 112/72 | HR 68 | Ht 70.0 in | Wt 210.0 lb

## 2019-02-09 DIAGNOSIS — Z0000 Encounter for general adult medical examination without abnormal findings: Secondary | ICD-10-CM

## 2019-02-09 DIAGNOSIS — R7989 Other specified abnormal findings of blood chemistry: Secondary | ICD-10-CM | POA: Diagnosis not present

## 2019-02-09 MED ORDER — ALBUTEROL SULFATE HFA 108 (90 BASE) MCG/ACT IN AERS: 2.0000 | INHALATION_SPRAY | RESPIRATORY_TRACT | 0 refills | Status: DC | PRN

## 2019-02-09 NOTE — Progress Notes (Signed)
Virtual Visit via Video Note  I connected with Brian Walsh on 02/09/19 at  9:30 AM EDT by a video enabled telemedicine application and verified that I am speaking with the correct person using two identifiers.  Location patient: home Location provider:work or home office Persons participating in the virtual visit: patient, provider  I discussed the limitations of evaluation and management by telemedicine and the availability of in person appointments. The patient expressed understanding and agreed to proceed.  Telemedicine visit is a necessity given the COVID-19 restrictions in place at the current time.  HPI: 58 y/o WM being seen today for discussion of recent insurance physical and labs. Reviewed all labs/vitals done 12/29/18-->all normal except a1c mildly up at 5.8%.  Nonfasting glucose was 113. He says EKG was done but I don't have a copy of this.  He says "I got the insurance".  Walks 3 miles bid. Feeling well, no acute complaints. Asks for albut HFA rx to have on hand-->not having any trouble with wheezing, cough, SOB or chest tightness at all lately.   ROS: See pertinent positives and negatives per HPI.  Past Medical History:  Diagnosis Date  . Allergic rhinitis    Much improved with allergy immunotherapy (molds primarily)  . Allergy   . Asthma    no inhaler requirement since allergies under control with immunotherapy  . Blood transfusion without reported diagnosis   . BPH (benign prostatic hypertrophy) 2014   TURP  . Cancer ([place])    skin cancer   . Chronic constipation   . Chronic sinusitis    Deviated nasal septum: >90% airway obstruction on right.  Dr. Wilburn Cornelia discussed possible septoplasty and turbinate reduction with Brian Walsh in 2015.  Marland Kitchen Constipation, chronic 11/24/2011  . H/O blood clots    in eye   . History of adenomatous polyp of colon    Most recent colonoscopy 07/2017---recall 5 yrs.  . Lower extremity venous stasis   . Neuromuscular disorder ([place])    MS  . OSA on CPAP  2005   cpap nightly  . Personal history of colonic polyps 03/24/2012   03/2012 - 5 mm cecal adenoma  . PUD (peptic ulcer disease)    Bleeding ulcer 1978  . Relapsing remitting multiple sclerosis ([place]) 1992   Remission since 1996 on no meds.  Initial presentation was gait/balance and leg weakness sx's.  Additional relapses with blurry vision and uncoordination.  Dr. Tomi Likens 04/2017 recommended re-estab baseline MRI brain and C-spine + start disease modifying therapy but Brian Walsh declined.  . Sleep apnea    wears cpap   . Thyroid nodule 05/2012   Right lobe 50mmX14mm nodule found on screening thyroid and carotid u/s done through his employer.  Brian Walsh was euthyroid at that time.  A radioactive iodine uptake and scan was normal 06/2012.  FNA bx 04/2015 was BENIGN/CYST.    Past Surgical History:  Procedure Laterality Date  . APPENDECTOMY  1986  . COLONOSCOPY  2006, 2013; 07/2017   diminutive hyperplastic cecal polyp [facility]); diminutive cecal adenoma removed 2013.  Repeat 07/24/17, one polyp--recall 5 yrs.  Marland Kitchen FEMUR FRACTURE SURGERY  1978   with rod insertion.  Rod removal 1979  . FNA R Thyroid nodule  04/17/15   Benign/cystic (Dr. Cruzita Lederer)  . INGUINAL HERNIA REPAIR Right 10/22/2015   Procedure: LAPAROSCOPIC RIGHT INGUINAL HERNIA REPAIR;  Surgeon: Arta Bruce Kinsinger, MD;  Location: [facility];  Service: General;  Laterality: Right;  . INSERTION OF MESH Right 10/22/2015   Procedure: INSERTION  OF MESH;  Surgeon: Mickeal Skinner, MD;  Location[facility];  Service: General;  Laterality: Right;  . TRANSURETHRAL RESECTION OF PROSTATE N/A 08/25/2012   Procedure: TRANSURETHRAL RESECTION OF THE PROSTATE WITH GYRUS INSTRUMENTS;  Surgeon: Alexis Frock, MD;  Location: [facility];  Service: Urology;  Laterality: N/A;--patient got excellent results from procedure.    Family History  Problem Relation Age of Onset  . Parkinsonism Father   . Stroke Father   . Colon polyps Father 33  .  Breast cancer Mother   . Colon cancer Neg Hx   . Esophageal cancer Neg Hx   . Rectal cancer Neg Hx   . Stomach cancer Neg Hx      Current Outpatient Medications:  .  albuterol (PROVENTIL HFA;VENTOLIN HFA) 108 (90 Base) MCG/ACT inhaler, Inhale 2 puffs into the lungs every 4 (four) hours as needed for shortness of breath., Disp: 1 Inhaler, Rfl: 1 .  aspirin EC 81 MG tablet, Take 81 mg by mouth every other day. , Disp: , Rfl:  .  B Complex-Folic Acid (B COMPLEX-VITAMIN B12 PO), Take 1 capsule by mouth daily., Disp: , Rfl:  .  Calcium-Magnesium-Zinc 167-83-8 MG TABS, Take by mouth 2 (two) times daily after a meal., Disp: , Rfl:  .  cholecalciferol (VITAMIN D) 1000 units tablet, Take 1,000 Units by mouth 2 (two) times daily. , Disp: , Rfl:  .  CLARITIN-D 24 HOUR 10-240 MG 24 hr tablet, TAKE 1 TABLET BY MOUTH DAILY, Disp: 30 tablet, Rfl: 0 .  fluorouracil (EFUDEX) 5 % cream, Apply 1 application topically daily as needed. , Disp: , Rfl: 0 .  fluticasone (FLONASE) 50 MCG/ACT nasal spray, 2 sprays each nostril once each morning, Disp: 48 g, Rfl: 2 .  Omega-3 Fatty Acids (FISH OIL) 1000 MG CAPS, Take by mouth 2 (two) times daily. , Disp: , Rfl:  .  psyllium (METAMUCIL) 58.6 % powder, Take 1 packet by mouth 2 (two) times daily. , Disp: , Rfl:  .  senna (SENOKOT) 8.6 MG tablet, Take 2 tablets by mouth 2 (two) times daily as needed for constipation., Disp: , Rfl:  .  vitamin C (ASCORBIC ACID) 500 MG tablet, Take 500 mg by mouth as needed. , Disp: , Rfl:   EXAM:  VITALS per patient if applicable: BP 970/26 (BP Location: Left Arm, Patient Position: Sitting, Cuff Size: Large)   Pulse 68   Ht 5\' 10"  (1.778 m)   Wt 210 lb (95.3 kg)   BMI 30.13 kg/m    GENERAL: alert, oriented, appears well and in no acute distress  HEENT: atraumatic, conjunttiva clear, no obvious abnormalities on inspection of external nose and ears  NECK: normal movements of the head and neck  LUNGS: on inspection no signs of  respiratory distress, breathing rate appears normal, no obvious gross SOB, gasping or wheezing  CV: no obvious cyanosis  MS: moves all visible extremities without noticeable abnormality  PSYCH/NEURO: pleasant and cooperative, no obvious depression or anxiety, speech and thought processing grossly intact  LABS: none today  Lab Results  Component Value Date   TSH 1.36 01/08/2018   Lab Results  Component Value Date   WBC 5.2 01/08/2018   HGB 14.7 01/08/2018   HCT 43.6 01/08/2018   MCV 92.0 01/08/2018   PLT 276.0 01/08/2018   Lab Results  Component Value Date   CREATININE 0.9 12/29/2018   BUN 11 12/29/2018   NA 138 01/08/2018   K 4.4 01/08/2018  CL 103 01/08/2018   CO2 29 01/08/2018   Lab Results  Component Value Date   ALT 25 12/29/2018   AST 18 12/29/2018   ALKPHOS 80 12/29/2018   BILITOT 0.5 01/08/2018   Lab Results  Component Value Date   CHOL 183 12/29/2018   Lab Results  Component Value Date   HDL 56 12/29/2018   Lab Results  Component Value Date   LDLCALC 103 12/29/2018   Lab Results  Component Value Date   TRIG 113 12/29/2018   Lab Results  Component Value Date   CHOLHDL 3 01/08/2018   Lab Results  Component Value Date   PSA 0.65 01/08/2018   PSA 0.65 01/02/2017   PSA 1.73 06/12/2011   Lab Results  Component Value Date   HGBA1C 5.8 12/29/2018    ASSESSMENT AND PLAN:  Discussed the following assessment and plan:  1) Preventative health: we discussed his BMI. He will continue to focus on increasing CV exercise, work on limiting carbs/starches. Very mildly elevated A1c.  Monitor annually.  Filled out form today with his recent biometric info.  I discussed the assessment and treatment plan with the patient. The patient was provided an opportunity to ask questions and all were answered. The patient agreed with the plan and demonstrated an understanding of the instructions.   The patient was advised to call back or seek an in-person  evaluation if the symptoms worsen or if the condition fails to improve as anticipated.  Spent 10 min with Brian Walsh today, with >50% of this time spent in counseling and care coordination regarding the above problems.  F/u: as needed.  Signed:  Crissie Sickles, MD           02/09/2019

## 2019-03-04 ENCOUNTER — Other Ambulatory Visit: Payer: Self-pay | Admitting: Family Medicine

## 2019-06-24 DIAGNOSIS — D045 Carcinoma in situ of skin of trunk: Secondary | ICD-10-CM | POA: Diagnosis not present

## 2019-06-24 DIAGNOSIS — D1801 Hemangioma of skin and subcutaneous tissue: Secondary | ICD-10-CM | POA: Diagnosis not present

## 2019-06-24 DIAGNOSIS — L570 Actinic keratosis: Secondary | ICD-10-CM | POA: Diagnosis not present

## 2019-06-24 DIAGNOSIS — L821 Other seborrheic keratosis: Secondary | ICD-10-CM | POA: Diagnosis not present

## 2019-06-24 DIAGNOSIS — L573 Poikiloderma of Civatte: Secondary | ICD-10-CM | POA: Diagnosis not present

## 2019-06-30 DIAGNOSIS — J3081 Allergic rhinitis due to animal (cat) (dog) hair and dander: Secondary | ICD-10-CM | POA: Diagnosis not present

## 2019-06-30 DIAGNOSIS — J3089 Other allergic rhinitis: Secondary | ICD-10-CM | POA: Diagnosis not present

## 2019-06-30 DIAGNOSIS — J301 Allergic rhinitis due to pollen: Secondary | ICD-10-CM | POA: Diagnosis not present

## 2019-07-11 DIAGNOSIS — J3081 Allergic rhinitis due to animal (cat) (dog) hair and dander: Secondary | ICD-10-CM | POA: Diagnosis not present

## 2019-07-11 DIAGNOSIS — J301 Allergic rhinitis due to pollen: Secondary | ICD-10-CM | POA: Diagnosis not present

## 2019-07-12 DIAGNOSIS — J3089 Other allergic rhinitis: Secondary | ICD-10-CM | POA: Diagnosis not present

## 2019-07-17 ENCOUNTER — Encounter: Payer: Self-pay | Admitting: Family Medicine

## 2019-08-03 DIAGNOSIS — J3081 Allergic rhinitis due to animal (cat) (dog) hair and dander: Secondary | ICD-10-CM | POA: Diagnosis not present

## 2019-08-03 DIAGNOSIS — J301 Allergic rhinitis due to pollen: Secondary | ICD-10-CM | POA: Diagnosis not present

## 2019-08-03 DIAGNOSIS — J3089 Other allergic rhinitis: Secondary | ICD-10-CM | POA: Diagnosis not present

## 2019-08-08 DIAGNOSIS — J3081 Allergic rhinitis due to animal (cat) (dog) hair and dander: Secondary | ICD-10-CM | POA: Diagnosis not present

## 2019-08-08 DIAGNOSIS — J301 Allergic rhinitis due to pollen: Secondary | ICD-10-CM | POA: Diagnosis not present

## 2019-08-08 DIAGNOSIS — J3089 Other allergic rhinitis: Secondary | ICD-10-CM | POA: Diagnosis not present

## 2019-08-11 DIAGNOSIS — J301 Allergic rhinitis due to pollen: Secondary | ICD-10-CM | POA: Diagnosis not present

## 2019-08-11 DIAGNOSIS — J3081 Allergic rhinitis due to animal (cat) (dog) hair and dander: Secondary | ICD-10-CM | POA: Diagnosis not present

## 2019-08-11 DIAGNOSIS — J3089 Other allergic rhinitis: Secondary | ICD-10-CM | POA: Diagnosis not present

## 2019-08-15 DIAGNOSIS — J3081 Allergic rhinitis due to animal (cat) (dog) hair and dander: Secondary | ICD-10-CM | POA: Diagnosis not present

## 2019-08-15 DIAGNOSIS — J3089 Other allergic rhinitis: Secondary | ICD-10-CM | POA: Diagnosis not present

## 2019-08-15 DIAGNOSIS — J301 Allergic rhinitis due to pollen: Secondary | ICD-10-CM | POA: Diagnosis not present

## 2019-08-18 DIAGNOSIS — J3089 Other allergic rhinitis: Secondary | ICD-10-CM | POA: Diagnosis not present

## 2019-08-18 DIAGNOSIS — J301 Allergic rhinitis due to pollen: Secondary | ICD-10-CM | POA: Diagnosis not present

## 2019-08-18 DIAGNOSIS — J3081 Allergic rhinitis due to animal (cat) (dog) hair and dander: Secondary | ICD-10-CM | POA: Diagnosis not present

## 2019-08-23 DIAGNOSIS — J301 Allergic rhinitis due to pollen: Secondary | ICD-10-CM | POA: Diagnosis not present

## 2019-08-23 DIAGNOSIS — J3089 Other allergic rhinitis: Secondary | ICD-10-CM | POA: Diagnosis not present

## 2019-08-25 DIAGNOSIS — J3081 Allergic rhinitis due to animal (cat) (dog) hair and dander: Secondary | ICD-10-CM | POA: Diagnosis not present

## 2019-08-25 DIAGNOSIS — J301 Allergic rhinitis due to pollen: Secondary | ICD-10-CM | POA: Diagnosis not present

## 2019-08-25 DIAGNOSIS — J3089 Other allergic rhinitis: Secondary | ICD-10-CM | POA: Diagnosis not present

## 2019-08-29 DIAGNOSIS — J3081 Allergic rhinitis due to animal (cat) (dog) hair and dander: Secondary | ICD-10-CM | POA: Diagnosis not present

## 2019-08-29 DIAGNOSIS — J301 Allergic rhinitis due to pollen: Secondary | ICD-10-CM | POA: Diagnosis not present

## 2019-08-29 DIAGNOSIS — J3089 Other allergic rhinitis: Secondary | ICD-10-CM | POA: Diagnosis not present

## 2019-08-31 DIAGNOSIS — J3089 Other allergic rhinitis: Secondary | ICD-10-CM | POA: Diagnosis not present

## 2019-08-31 DIAGNOSIS — J3081 Allergic rhinitis due to animal (cat) (dog) hair and dander: Secondary | ICD-10-CM | POA: Diagnosis not present

## 2019-08-31 DIAGNOSIS — J301 Allergic rhinitis due to pollen: Secondary | ICD-10-CM | POA: Diagnosis not present

## 2019-09-05 DIAGNOSIS — J301 Allergic rhinitis due to pollen: Secondary | ICD-10-CM | POA: Diagnosis not present

## 2019-09-05 DIAGNOSIS — J3089 Other allergic rhinitis: Secondary | ICD-10-CM | POA: Diagnosis not present

## 2019-09-05 DIAGNOSIS — J3081 Allergic rhinitis due to animal (cat) (dog) hair and dander: Secondary | ICD-10-CM | POA: Diagnosis not present

## 2019-09-07 DIAGNOSIS — J3089 Other allergic rhinitis: Secondary | ICD-10-CM | POA: Diagnosis not present

## 2019-09-07 DIAGNOSIS — J301 Allergic rhinitis due to pollen: Secondary | ICD-10-CM | POA: Diagnosis not present

## 2019-09-07 DIAGNOSIS — J3081 Allergic rhinitis due to animal (cat) (dog) hair and dander: Secondary | ICD-10-CM | POA: Diagnosis not present

## 2019-09-12 DIAGNOSIS — J301 Allergic rhinitis due to pollen: Secondary | ICD-10-CM | POA: Diagnosis not present

## 2019-09-12 DIAGNOSIS — J3081 Allergic rhinitis due to animal (cat) (dog) hair and dander: Secondary | ICD-10-CM | POA: Diagnosis not present

## 2019-09-12 DIAGNOSIS — J3089 Other allergic rhinitis: Secondary | ICD-10-CM | POA: Diagnosis not present

## 2019-09-14 DIAGNOSIS — J301 Allergic rhinitis due to pollen: Secondary | ICD-10-CM | POA: Diagnosis not present

## 2019-09-14 DIAGNOSIS — J3081 Allergic rhinitis due to animal (cat) (dog) hair and dander: Secondary | ICD-10-CM | POA: Diagnosis not present

## 2019-09-14 DIAGNOSIS — J3089 Other allergic rhinitis: Secondary | ICD-10-CM | POA: Diagnosis not present

## 2019-09-19 DIAGNOSIS — J3089 Other allergic rhinitis: Secondary | ICD-10-CM | POA: Diagnosis not present

## 2019-09-19 DIAGNOSIS — J301 Allergic rhinitis due to pollen: Secondary | ICD-10-CM | POA: Diagnosis not present

## 2019-09-19 DIAGNOSIS — J3081 Allergic rhinitis due to animal (cat) (dog) hair and dander: Secondary | ICD-10-CM | POA: Diagnosis not present

## 2019-09-21 DIAGNOSIS — J3081 Allergic rhinitis due to animal (cat) (dog) hair and dander: Secondary | ICD-10-CM | POA: Diagnosis not present

## 2019-09-21 DIAGNOSIS — J301 Allergic rhinitis due to pollen: Secondary | ICD-10-CM | POA: Diagnosis not present

## 2019-09-21 DIAGNOSIS — J3089 Other allergic rhinitis: Secondary | ICD-10-CM | POA: Diagnosis not present

## 2019-09-26 DIAGNOSIS — J3089 Other allergic rhinitis: Secondary | ICD-10-CM | POA: Diagnosis not present

## 2019-09-26 DIAGNOSIS — J301 Allergic rhinitis due to pollen: Secondary | ICD-10-CM | POA: Diagnosis not present

## 2019-09-26 DIAGNOSIS — J3081 Allergic rhinitis due to animal (cat) (dog) hair and dander: Secondary | ICD-10-CM | POA: Diagnosis not present

## 2019-09-28 DIAGNOSIS — J301 Allergic rhinitis due to pollen: Secondary | ICD-10-CM | POA: Diagnosis not present

## 2019-09-28 DIAGNOSIS — J3089 Other allergic rhinitis: Secondary | ICD-10-CM | POA: Diagnosis not present

## 2019-09-28 DIAGNOSIS — J3081 Allergic rhinitis due to animal (cat) (dog) hair and dander: Secondary | ICD-10-CM | POA: Diagnosis not present

## 2019-10-03 DIAGNOSIS — J3089 Other allergic rhinitis: Secondary | ICD-10-CM | POA: Diagnosis not present

## 2019-10-03 DIAGNOSIS — J3081 Allergic rhinitis due to animal (cat) (dog) hair and dander: Secondary | ICD-10-CM | POA: Diagnosis not present

## 2019-10-03 DIAGNOSIS — J301 Allergic rhinitis due to pollen: Secondary | ICD-10-CM | POA: Diagnosis not present

## 2019-10-05 DIAGNOSIS — J301 Allergic rhinitis due to pollen: Secondary | ICD-10-CM | POA: Diagnosis not present

## 2019-10-05 DIAGNOSIS — J3081 Allergic rhinitis due to animal (cat) (dog) hair and dander: Secondary | ICD-10-CM | POA: Diagnosis not present

## 2019-10-05 DIAGNOSIS — J3089 Other allergic rhinitis: Secondary | ICD-10-CM | POA: Diagnosis not present

## 2019-10-06 ENCOUNTER — Other Ambulatory Visit: Payer: Self-pay

## 2019-10-06 MED ORDER — CLARITIN-D 24 HOUR 10-240 MG PO TB24: 1.0000 | ORAL_TABLET | Freq: Every day | ORAL | 1 refills | Status: DC

## 2019-10-10 DIAGNOSIS — J3081 Allergic rhinitis due to animal (cat) (dog) hair and dander: Secondary | ICD-10-CM | POA: Diagnosis not present

## 2019-10-10 DIAGNOSIS — J3089 Other allergic rhinitis: Secondary | ICD-10-CM | POA: Diagnosis not present

## 2019-10-10 DIAGNOSIS — J301 Allergic rhinitis due to pollen: Secondary | ICD-10-CM | POA: Diagnosis not present

## 2019-10-13 DIAGNOSIS — J3089 Other allergic rhinitis: Secondary | ICD-10-CM | POA: Diagnosis not present

## 2019-10-13 DIAGNOSIS — J301 Allergic rhinitis due to pollen: Secondary | ICD-10-CM | POA: Diagnosis not present

## 2019-10-13 DIAGNOSIS — J3081 Allergic rhinitis due to animal (cat) (dog) hair and dander: Secondary | ICD-10-CM | POA: Diagnosis not present

## 2019-10-19 DIAGNOSIS — J301 Allergic rhinitis due to pollen: Secondary | ICD-10-CM | POA: Diagnosis not present

## 2019-10-19 DIAGNOSIS — J3081 Allergic rhinitis due to animal (cat) (dog) hair and dander: Secondary | ICD-10-CM | POA: Diagnosis not present

## 2019-10-19 DIAGNOSIS — J3089 Other allergic rhinitis: Secondary | ICD-10-CM | POA: Diagnosis not present

## 2019-10-26 DIAGNOSIS — J3089 Other allergic rhinitis: Secondary | ICD-10-CM | POA: Diagnosis not present

## 2019-10-26 DIAGNOSIS — J301 Allergic rhinitis due to pollen: Secondary | ICD-10-CM | POA: Diagnosis not present

## 2019-10-26 DIAGNOSIS — J3081 Allergic rhinitis due to animal (cat) (dog) hair and dander: Secondary | ICD-10-CM | POA: Diagnosis not present

## 2019-11-02 DIAGNOSIS — J301 Allergic rhinitis due to pollen: Secondary | ICD-10-CM | POA: Diagnosis not present

## 2019-11-02 DIAGNOSIS — J3081 Allergic rhinitis due to animal (cat) (dog) hair and dander: Secondary | ICD-10-CM | POA: Diagnosis not present

## 2019-11-02 DIAGNOSIS — J3089 Other allergic rhinitis: Secondary | ICD-10-CM | POA: Diagnosis not present

## 2019-11-07 DIAGNOSIS — J3089 Other allergic rhinitis: Secondary | ICD-10-CM | POA: Diagnosis not present

## 2019-11-07 DIAGNOSIS — J301 Allergic rhinitis due to pollen: Secondary | ICD-10-CM | POA: Diagnosis not present

## 2019-11-07 DIAGNOSIS — J3081 Allergic rhinitis due to animal (cat) (dog) hair and dander: Secondary | ICD-10-CM | POA: Diagnosis not present

## 2019-11-14 DIAGNOSIS — J3081 Allergic rhinitis due to animal (cat) (dog) hair and dander: Secondary | ICD-10-CM | POA: Diagnosis not present

## 2019-11-14 DIAGNOSIS — J301 Allergic rhinitis due to pollen: Secondary | ICD-10-CM | POA: Diagnosis not present

## 2019-11-15 ENCOUNTER — Telehealth: Payer: Self-pay | Admitting: Family Medicine

## 2019-11-15 ENCOUNTER — Other Ambulatory Visit: Payer: Self-pay

## 2019-11-15 DIAGNOSIS — R030 Elevated blood-pressure reading, without diagnosis of hypertension: Secondary | ICD-10-CM | POA: Diagnosis not present

## 2019-11-15 DIAGNOSIS — J3089 Other allergic rhinitis: Secondary | ICD-10-CM | POA: Diagnosis not present

## 2019-11-15 DIAGNOSIS — Z20828 Contact with and (suspected) exposure to other viral communicable diseases: Secondary | ICD-10-CM | POA: Diagnosis not present

## 2019-11-15 DIAGNOSIS — J0101 Acute recurrent maxillary sinusitis: Secondary | ICD-10-CM | POA: Diagnosis not present

## 2019-11-15 DIAGNOSIS — R062 Wheezing: Secondary | ICD-10-CM | POA: Diagnosis not present

## 2019-11-15 NOTE — Telephone Encounter (Signed)
Patient advised of 4:15pm appointment.

## 2019-11-15 NOTE — Telephone Encounter (Signed)
Patient requesting VV for sinus symptoms, had covid vaccine. Heavy cough, SOB- asthmatic, head congestions, no temperature.   Can patient be worked in 11/16/19?

## 2019-11-15 NOTE — Telephone Encounter (Signed)
Put on at 4:15 tomorrow (11/16/19) -thx

## 2019-11-16 ENCOUNTER — Telehealth: Payer: BC Managed Care – PPO | Admitting: Family Medicine

## 2019-11-23 DIAGNOSIS — J3081 Allergic rhinitis due to animal (cat) (dog) hair and dander: Secondary | ICD-10-CM | POA: Diagnosis not present

## 2019-11-23 DIAGNOSIS — J3089 Other allergic rhinitis: Secondary | ICD-10-CM | POA: Diagnosis not present

## 2019-11-23 DIAGNOSIS — J301 Allergic rhinitis due to pollen: Secondary | ICD-10-CM | POA: Diagnosis not present

## 2019-12-01 DIAGNOSIS — J3089 Other allergic rhinitis: Secondary | ICD-10-CM | POA: Diagnosis not present

## 2019-12-01 DIAGNOSIS — J3081 Allergic rhinitis due to animal (cat) (dog) hair and dander: Secondary | ICD-10-CM | POA: Diagnosis not present

## 2019-12-01 DIAGNOSIS — J301 Allergic rhinitis due to pollen: Secondary | ICD-10-CM | POA: Diagnosis not present

## 2019-12-13 DIAGNOSIS — J301 Allergic rhinitis due to pollen: Secondary | ICD-10-CM | POA: Diagnosis not present

## 2019-12-13 DIAGNOSIS — J3081 Allergic rhinitis due to animal (cat) (dog) hair and dander: Secondary | ICD-10-CM | POA: Diagnosis not present

## 2019-12-13 DIAGNOSIS — J3089 Other allergic rhinitis: Secondary | ICD-10-CM | POA: Diagnosis not present

## 2019-12-15 DIAGNOSIS — J3089 Other allergic rhinitis: Secondary | ICD-10-CM | POA: Diagnosis not present

## 2019-12-15 DIAGNOSIS — J301 Allergic rhinitis due to pollen: Secondary | ICD-10-CM | POA: Diagnosis not present

## 2019-12-15 DIAGNOSIS — J3081 Allergic rhinitis due to animal (cat) (dog) hair and dander: Secondary | ICD-10-CM | POA: Diagnosis not present

## 2019-12-23 DIAGNOSIS — D2261 Melanocytic nevi of right upper limb, including shoulder: Secondary | ICD-10-CM | POA: Diagnosis not present

## 2019-12-23 DIAGNOSIS — J301 Allergic rhinitis due to pollen: Secondary | ICD-10-CM | POA: Diagnosis not present

## 2019-12-23 DIAGNOSIS — L821 Other seborrheic keratosis: Secondary | ICD-10-CM | POA: Diagnosis not present

## 2019-12-23 DIAGNOSIS — D225 Melanocytic nevi of trunk: Secondary | ICD-10-CM | POA: Diagnosis not present

## 2019-12-23 DIAGNOSIS — J3081 Allergic rhinitis due to animal (cat) (dog) hair and dander: Secondary | ICD-10-CM | POA: Diagnosis not present

## 2019-12-23 DIAGNOSIS — Z85828 Personal history of other malignant neoplasm of skin: Secondary | ICD-10-CM | POA: Diagnosis not present

## 2019-12-23 DIAGNOSIS — J3089 Other allergic rhinitis: Secondary | ICD-10-CM | POA: Diagnosis not present

## 2019-12-26 DIAGNOSIS — J3089 Other allergic rhinitis: Secondary | ICD-10-CM | POA: Diagnosis not present

## 2019-12-26 DIAGNOSIS — J301 Allergic rhinitis due to pollen: Secondary | ICD-10-CM | POA: Diagnosis not present

## 2019-12-26 DIAGNOSIS — J3081 Allergic rhinitis due to animal (cat) (dog) hair and dander: Secondary | ICD-10-CM | POA: Diagnosis not present

## 2019-12-29 DIAGNOSIS — J301 Allergic rhinitis due to pollen: Secondary | ICD-10-CM | POA: Diagnosis not present

## 2019-12-29 DIAGNOSIS — J3089 Other allergic rhinitis: Secondary | ICD-10-CM | POA: Diagnosis not present

## 2019-12-29 DIAGNOSIS — J3081 Allergic rhinitis due to animal (cat) (dog) hair and dander: Secondary | ICD-10-CM | POA: Diagnosis not present

## 2020-01-03 DIAGNOSIS — J301 Allergic rhinitis due to pollen: Secondary | ICD-10-CM | POA: Diagnosis not present

## 2020-01-03 DIAGNOSIS — J3089 Other allergic rhinitis: Secondary | ICD-10-CM | POA: Diagnosis not present

## 2020-01-03 DIAGNOSIS — J3081 Allergic rhinitis due to animal (cat) (dog) hair and dander: Secondary | ICD-10-CM | POA: Diagnosis not present

## 2020-01-11 ENCOUNTER — Encounter: Payer: BC Managed Care – PPO | Admitting: Family Medicine

## 2020-01-12 ENCOUNTER — Other Ambulatory Visit: Payer: Self-pay

## 2020-01-12 DIAGNOSIS — J301 Allergic rhinitis due to pollen: Secondary | ICD-10-CM | POA: Diagnosis not present

## 2020-01-12 DIAGNOSIS — J3089 Other allergic rhinitis: Secondary | ICD-10-CM | POA: Diagnosis not present

## 2020-01-12 DIAGNOSIS — J3081 Allergic rhinitis due to animal (cat) (dog) hair and dander: Secondary | ICD-10-CM | POA: Diagnosis not present

## 2020-01-17 ENCOUNTER — Encounter: Payer: Self-pay | Admitting: Family Medicine

## 2020-01-17 ENCOUNTER — Ambulatory Visit (INDEPENDENT_AMBULATORY_CARE_PROVIDER_SITE_OTHER): Payer: BC Managed Care – PPO | Admitting: Family Medicine

## 2020-01-17 ENCOUNTER — Other Ambulatory Visit: Payer: Self-pay

## 2020-01-17 VITALS — BP 150/101 | HR 79 | Temp 98.3°F | Resp 16 | Ht 70.0 in | Wt 212.8 lb

## 2020-01-17 DIAGNOSIS — R030 Elevated blood-pressure reading, without diagnosis of hypertension: Secondary | ICD-10-CM

## 2020-01-17 DIAGNOSIS — R7301 Impaired fasting glucose: Secondary | ICD-10-CM

## 2020-01-17 DIAGNOSIS — G35 Multiple sclerosis: Secondary | ICD-10-CM | POA: Diagnosis not present

## 2020-01-17 DIAGNOSIS — Z0001 Encounter for general adult medical examination with abnormal findings: Secondary | ICD-10-CM

## 2020-01-17 DIAGNOSIS — E041 Nontoxic single thyroid nodule: Secondary | ICD-10-CM

## 2020-01-17 DIAGNOSIS — Z125 Encounter for screening for malignant neoplasm of prostate: Secondary | ICD-10-CM

## 2020-01-17 DIAGNOSIS — Z0000 Encounter for general adult medical examination without abnormal findings: Secondary | ICD-10-CM

## 2020-01-17 DIAGNOSIS — N529 Male erectile dysfunction, unspecified: Secondary | ICD-10-CM

## 2020-01-17 LAB — COMPREHENSIVE METABOLIC PANEL
ALT: 18 U/L (ref 0–53)
AST: 20 U/L (ref 0–37)
Albumin: 4.3 g/dL (ref 3.5–5.2)
Alkaline Phosphatase: 65 U/L (ref 39–117)
BUN: 21 mg/dL (ref 6–23)
CO2: 28 mEq/L (ref 19–32)
Calcium: 9.3 mg/dL (ref 8.4–10.5)
Chloride: 102 mEq/L (ref 96–112)
Creatinine, Ser: 0.99 mg/dL (ref 0.40–1.50)
GFR: 77.32 mL/min (ref >60.00)
Glucose, Bld: 118 mg/dL — ABNORMAL HIGH (ref 70–99)
Potassium: 4.3 mEq/L (ref 3.5–5.1)
Sodium: 142 mEq/L (ref 135–145)
Total Bilirubin: 0.6 mg/dL (ref 0.2–1.2)
Total Protein: 7 g/dL (ref 6.0–8.3)

## 2020-01-17 LAB — CBC WITH DIFFERENTIAL/PLATELET
Basophils Absolute: 0 10*3/uL (ref 0.0–0.1)
Basophils Relative: 0.8 % (ref 0.0–3.0)
Eosinophils Absolute: 0.1 10*3/uL (ref 0.0–0.7)
Eosinophils Relative: 2.3 % (ref 0.0–5.0)
HCT: 43.9 % (ref 39.0–52.0)
Hemoglobin: 14.7 g/dL (ref 13.0–17.0)
Lymphocytes Relative: 25.9 % (ref 12.0–46.0)
Lymphs Abs: 1.5 10*3/uL (ref 0.7–4.0)
MCHC: 33.5 g/dL (ref 30.0–36.0)
MCV: 94.5 fl (ref 78.0–100.0)
Monocytes Absolute: 0.6 10*3/uL (ref 0.1–1.0)
Monocytes Relative: 11 % (ref 3.0–12.0)
Neutro Abs: 3.5 10*3/uL (ref 1.4–7.7)
Neutrophils Relative %: 60 % (ref 43.0–77.0)
Platelets: 255 10*3/uL (ref 150.0–400.0)
RBC: 4.65 Mil/uL (ref 4.22–5.81)
RDW: 14 % (ref 11.5–15.5)
WBC: 5.8 10*3/uL (ref 4.0–10.5)

## 2020-01-17 LAB — LIPID PANEL
Cholesterol: 161 mg/dL (ref 0–200)
HDL: 60.9 mg/dL (ref >39.00)
LDL Cholesterol: 87 mg/dL (ref 0–99)
NonHDL: 100.01
Total CHOL/HDL Ratio: 3
Triglycerides: 66 mg/dL (ref 0.0–149.0)
VLDL: 13.2 mg/dL (ref 0.0–40.0)

## 2020-01-17 LAB — TSH: TSH: 1.35 u[IU]/mL (ref 0.35–4.50)

## 2020-01-17 LAB — HEMOGLOBIN A1C: Hgb A1c MFr Bld: 5.8 % (ref 4.6–6.5)

## 2020-01-17 LAB — PSA: PSA: 0.6 ng/mL (ref 0.10–4.00)

## 2020-01-17 MED ORDER — SILDENAFIL CITRATE 20 MG PO TABS: ORAL_TABLET | ORAL | 6 refills | Status: DC

## 2020-01-17 MED ORDER — ALBUTEROL SULFATE HFA 108 (90 BASE) MCG/ACT IN AERS: 2.0000 | INHALATION_SPRAY | RESPIRATORY_TRACT | 0 refills | Status: DC | PRN

## 2020-01-17 NOTE — Progress Notes (Signed)
Office Note 01/17/2020  CC:  Chief Complaint  Patient presents with  . Annual Exam    pt is fasting     HPI:  Brian Walsh is a 59 y.o. White male who is here for annual health maintenance exam.  Generally feeling well. Seeing allergist/asthma MD, having more allergy sx's lately, also some asthma sx's but none currently.  Asks for albut inhal RF, going to seek opinion of another MD in her office.  Walking 7 miles per day and does yardwork, plans on restarting wt lifting at the gym.  Libido normal but has ED. Mild erection but not enough for penetration, says he still can have an orgasm. BP up today: says he has hx of white coat syndrome. Home measurements always <130/80. Diet: mostly vegetarian, low starches.  ROS: some chronic LE venous insuff edema--describes minimal. no fevers, no CP, no SOB, no wheezing, no cough, no dizziness, no HAs, no rashes, no melena/hematochezia.  No polyuria or polydipsia.  No myalgias or arthralgias.  No focal weakness, paresthesias, or tremors.  No acute vision or hearing abnormalities. No n/v/d or abd pain.  No palpitations.    Past Medical History:  Diagnosis Date  . Allergic rhinitis    Much improved with allergy immunotherapy. Restart immunotherapy w/Dr. Whelan winter 2020 (cat/dog dander, pollens).  . Allergy   . Asthma    no inhaler requirement since allergies under control with immunotherapy  . Blood transfusion without reported diagnosis   . BPH (benign prostatic hypertrophy) 2014   TURP  . Cancer ([place])    skin cancer   . Chronic constipation   . Chronic sinusitis    Deviated nasal septum: >90% airway obstruction on right.  Dr. Wilburn Cornelia discussed possible septoplasty and turbinate reduction with pt in 2015.  Marland Kitchen Constipation, chronic 11/24/2011  . H/O blood clots    in eye   . History of adenomatous polyp of colon    Most recent colonoscopy 07/2017---recall 5 yrs.  . Lower extremity venous stasis   . Neuromuscular disorder ([place])     MS  . OSA on CPAP 2005   cpap nightly  . Personal history of colonic polyps 03/24/2012   03/2012 - 5 mm cecal adenoma  . PUD (peptic ulcer disease)    Bleeding ulcer 1978  . Relapsing remitting multiple sclerosis ([place]) 1992   Remission since 1996 on no meds.  Initial presentation was gait/balance and leg weakness sx's.  Additional relapses with blurry vision and uncoordination.  Dr. Tomi Likens 04/2017 recommended re-estab baseline MRI brain and C-spine + start disease modifying therapy but pt declined.  . Sleep apnea    wears cpap   . Thyroid nodule 05/2012   Right lobe 80mmX14mm nodule found on screening thyroid and carotid u/s done through his employer.  Pt was euthyroid at that time.  A radioactive iodine uptake and scan was normal 06/2012.  FNA bx 04/2015 was BENIGN/CYST.    Past Surgical History:  Procedure Laterality Date  . APPENDECTOMY  1986  . COLONOSCOPY  2006, 2013; 07/2017   diminutive hyperplastic cecal polyp [facility]); diminutive cecal adenoma removed 2013.  Repeat 07/24/17, one polyp--recall 5 yrs.  Marland Kitchen FEMUR FRACTURE SURGERY  1978   with rod insertion.  Rod removal 1979  . FNA R Thyroid nodule  04/17/15   Benign/cystic (Dr. Cruzita Lederer)  . INGUINAL HERNIA REPAIR Right 10/22/2015   Procedure: LAPAROSCOPIC RIGHT INGUINAL HERNIA REPAIR;  Surgeon: Arta Bruce Kinsinger, MD;  Location: [facility];  Service: General;  Laterality: Right;  . INSERTION OF MESH Right 10/22/2015   Procedure: INSERTION OF MESH;  Surgeon: Mickeal Skinner, MD;  Location: [facility];  Service: General;  Laterality: Right;  . TRANSURETHRAL RESECTION OF PROSTATE N/A 08/25/2012   Procedure: TRANSURETHRAL RESECTION OF THE PROSTATE WITH GYRUS INSTRUMENTS;  Surgeon: Alexis Frock, MD;  [facility];  Service: Urology;  Laterality: N/A;--patient got excellent results from procedure.    Family History  Problem Relation Age of Onset  . Parkinsonism Father   . Stroke Father   .  Colon polyps Father 25  . Breast cancer Mother   . Colon cancer Neg Hx   . Esophageal cancer Neg Hx   . Rectal cancer Neg Hx   . Stomach cancer Neg Hx     Social History   Socioeconomic History  . Marital status: Married    Spouse name: Not on file  . Number of children: 1  . Years of education: Not on file  . Highest education level: Not on file  Occupational History  . 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 Smoking status: Never Smoker  . Smokeless tobacco: Never Used  Vaping Use  . Vaping Use: Never used  Substance and Sexual Activity  . Alcohol use: Yes    Alcohol/week: 1.0 standard drink    Types: 1 Glasses of wine per week    Comment: occasional wine   . Drug use: No  . Sexual activity: Not on file  Other Topics Concern  . Not on file  Social History Narrative   Married, has one 67 y/o son.   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   JD and MBA from [employer].   No tobacco, 3-4 glasses of red wine per week, no drug use.   No exercise.   [place], plays tennis, works out at gym 5 days a week,. 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 Difficulty of Paying Living Expenses:   Food Insecurity:   . Worried About Charity fundraiser in the Last Year:   . Arboriculturist in the Last Year:   Transportation Needs:   . Film/video editor (Medical):   Marland Kitchen Lack of Transportation (Non-Medical):   Physical Activity:   . Days of Exercise per Week:   . Minutes of Exercise per Session:   Stress:   . Feeling of Stress :   Social Connections:   . Frequency of Communication with Friends and Family:   . Frequency of Social Gatherings with Friends and Family:   . Attends Religious Services:   . Active Member of Clubs or Organizations:   . Attends Archivist Meetings:   Marland Kitchen Marital Status:   Intimate Partner Violence:   . Fear  of Current or Ex-Partner:   . Emotionally Abused:   Marland Kitchen Physically Abused:   . Sexually Abused:     Outpatient Medications Prior to Visit  Medication Sig Dispense Refill  . aspirin EC 81 MG tablet Take 81 mg by mouth every other day.     . B Complex-Folic Acid (B COMPLEX-VITAMIN B12 PO) Take 1 capsule by mouth daily.    . Calcium-Magnesium-Zinc 167-83-8 MG TABS Take by mouth 2 (two) times daily after a meal.    . cholecalciferol (VITAMIN D) 1000 units tablet Take 1,000 Units by  mouth 2 (two) times daily.     . fluorouracil (EFUDEX) 5 % cream Apply 1 application topically daily as needed.   0  . loratadine-pseudoephedrine (CLARITIN-D 24 HOUR) 10-240 MG 24 hr tablet Take 1 tablet by mouth daily. 90 tablet 1  . Olopatadine HCl 0.6 % SOLN Place 2 sprays into both nostrils 2 (two) times daily.    . psyllium (METAMUCIL) 58.6 % powder Take 1 packet by mouth 2 (two) times daily.     Marland Kitchen senna (SENOKOT) 8.6 MG tablet Take 2 tablets by mouth 2 (two) times daily as needed for constipation.    . vitamin C (ASCORBIC ACID) 500 MG tablet Take 500 mg by mouth as needed.     Marland Kitchen albuterol (VENTOLIN HFA) 108 (90 Base) MCG/ACT inhaler Inhale 2 puffs into the lungs every 4 (four) hours as needed for shortness of breath. 18 g 0  . EPINEPHrine 0.3 mg/0.3 mL IJ SOAJ injection INJECT INTO OUTER THIGH AS NEEDED FOR ANAPHYLAXIS (Patient not taking: Reported on 01/17/2020)    . levocetirizine (XYZAL) 5 MG tablet Take 5 mg by mouth at bedtime.    Marland Kitchen loratadine (CLARITIN) 10 MG tablet Take by mouth.     No facility-administered medications prior to visit.    Allergies  Allergen Reactions  . Molds & Smuts Other (See Comments)    PE; Vitals with BMI 01/17/2020 02/09/2019 01/08/2018  Height 5\' 10"  5\' 10"  5' 9.5"  Weight 212 lbs 13 oz 210 lbs 229 lbs 2 oz  BMI 30.53 14.97 02.63  Systolic 785 885 027  Diastolic 741 72 79  Pulse 79 68 59  O2 sat on RA today is 98% Initial bp today 150/101, repeat manual bp at end of visit  was 140/90. Gen: Alert, well appearing.  Patient is oriented to person, place, time, and situation. AFFECT: pleasant, lucid thought and speech. ENT: Ears: EACs clear, normal epithelium.  TMs with good light reflex and landmarks bilaterally.  Eyes: no injection, icteris, swelling, or exudate.  EOMI, PERRLA. Nose: no drainage or turbinate edema/swelling.  No injection or focal lesion.  Mouth: lips without lesion/swelling.  Oral mucosa pink and moist.  Dentition intact and without obvious caries or gingival swelling.  Oropharynx without erythema, exudate, or swelling.  Neck: supple/nontender.  No LAD, mass, or TM.  Carotid pulses 2+ bilaterally, without bruits. CV: RRR, no m/r/g.   LUNGS: CTA bilat, nonlabored resps, good aeration in all lung fields. ABD: soft, NT, ND, BS normal.  No hepatospenomegaly or mass.  No bruits. EXT: no clubbing, cyanosis, or edema.  Musculoskeletal: no joint swelling, erythema, warmth, or tenderness.  ROM of all joints intact. Skin - no sores or suspicious lesions or rashes or color changes Rectal exam: negative without mass, lesions or tenderness, PROSTATE EXAM: smooth and symmetric without nodules or tenderness.   Pertinent labs:  Lab Results  Component Value Date   TSH 1.36 01/08/2018   Lab Results  Component Value Date   WBC 5.2 01/08/2018   HGB 14.7 01/08/2018   HCT 43.6 01/08/2018   MCV 92.0 01/08/2018   PLT 276.0 01/08/2018   Lab Results  Component Value Date   CREATININE 0.9 12/29/2018   BUN 11 12/29/2018   NA 138 01/08/2018   K 4.4 01/08/2018   CL 103 01/08/2018   CO2 29 01/08/2018   Lab Results  Component Value Date   ALT 25 12/29/2018   AST 18 12/29/2018   ALKPHOS 80 12/29/2018   BILITOT 0.5 01/08/2018  Lab Results  Component Value Date   CHOL 183 12/29/2018   Lab Results  Component Value Date   HDL 56 12/29/2018   Lab Results  Component Value Date   LDLCALC 103 12/29/2018   Lab Results  Component Value Date   TRIG 113  12/29/2018   Lab Results  Component Value Date   CHOLHDL 3 01/08/2018   Lab Results  Component Value Date   PSA 0.65 01/08/2018   PSA 0.65 01/02/2017   PSA 1.73 06/12/2011   Lab Results  Component Value Date   HGBA1C 5.8 12/29/2018    ASSESSMENT AND PLAN:   Health maintenance exam: Reviewed age and gender appropriate health maintenance issues (prudent diet, regular exercise, health risks of tobacco and excessive alcohol, use of seatbelts, fire alarms in home, use of sunscreen).  Also reviewed age and gender appropriate health screening as well as vaccine recommendations. Vaccines: all UTD, including covid 19. Labs: fasting HP, HbA1c, PSA (IFG, hx benign thyroid cyst, mult sclerosis, and pro ca scr). Prostate ca screening: DRE normal, PSA. Colon ca screening: next colonoscopy 2024.  Elevated bp w/out dx HTN: hx of white coat syndrome.  Continue to periodically monitor home bp's.  Check daily and send these via MyChart.  Erectile dysfunction: trial of viagra 20mg , 1-5 po qd prn, #50, RF x 6. Therapeutic expectations and side effect profile of medication discussed today.  Patient's questions answered.  An After Visit Summary was printed and given to the patient.  FOLLOW UP:  Return in about 1 year (around 01/16/2021) for annual CPE (fasting).  Signed:  Crissie Sickles, MD           01/17/2020

## 2020-01-17 NOTE — Patient Instructions (Signed)
Check blood pressure and heart rate daily at home for 2 wks and send these numbers to me by MyChart. Goal bp <130/80.

## 2020-01-20 ENCOUNTER — Telehealth: Payer: Self-pay

## 2020-01-20 DIAGNOSIS — J301 Allergic rhinitis due to pollen: Secondary | ICD-10-CM | POA: Diagnosis not present

## 2020-01-20 DIAGNOSIS — J3081 Allergic rhinitis due to animal (cat) (dog) hair and dander: Secondary | ICD-10-CM | POA: Diagnosis not present

## 2020-01-20 DIAGNOSIS — J3089 Other allergic rhinitis: Secondary | ICD-10-CM | POA: Diagnosis not present

## 2020-01-20 NOTE — Telephone Encounter (Signed)
Completed PA for Sildenafil 20 mg tablets [name] Key: [id number]

## 2020-01-23 NOTE — Telephone Encounter (Signed)
Unknown outcome for PA.

## 2020-01-27 DIAGNOSIS — J3089 Other allergic rhinitis: Secondary | ICD-10-CM | POA: Diagnosis not present

## 2020-01-27 DIAGNOSIS — J301 Allergic rhinitis due to pollen: Secondary | ICD-10-CM | POA: Diagnosis not present

## 2020-01-27 DIAGNOSIS — J3081 Allergic rhinitis due to animal (cat) (dog) hair and dander: Secondary | ICD-10-CM | POA: Diagnosis not present

## 2020-02-01 DIAGNOSIS — J301 Allergic rhinitis due to pollen: Secondary | ICD-10-CM | POA: Diagnosis not present

## 2020-02-01 DIAGNOSIS — J3081 Allergic rhinitis due to animal (cat) (dog) hair and dander: Secondary | ICD-10-CM | POA: Diagnosis not present

## 2020-02-01 DIAGNOSIS — J3089 Other allergic rhinitis: Secondary | ICD-10-CM | POA: Diagnosis not present

## 2020-02-09 DIAGNOSIS — R062 Wheezing: Secondary | ICD-10-CM | POA: Diagnosis not present

## 2020-02-09 DIAGNOSIS — J301 Allergic rhinitis due to pollen: Secondary | ICD-10-CM | POA: Diagnosis not present

## 2020-02-09 DIAGNOSIS — J3089 Other allergic rhinitis: Secondary | ICD-10-CM | POA: Diagnosis not present

## 2020-02-09 DIAGNOSIS — J3081 Allergic rhinitis due to animal (cat) (dog) hair and dander: Secondary | ICD-10-CM | POA: Diagnosis not present

## 2020-02-16 DIAGNOSIS — J3089 Other allergic rhinitis: Secondary | ICD-10-CM | POA: Diagnosis not present

## 2020-02-16 DIAGNOSIS — J301 Allergic rhinitis due to pollen: Secondary | ICD-10-CM | POA: Diagnosis not present

## 2020-02-24 DIAGNOSIS — J3081 Allergic rhinitis due to animal (cat) (dog) hair and dander: Secondary | ICD-10-CM | POA: Diagnosis not present

## 2020-02-24 DIAGNOSIS — J3089 Other allergic rhinitis: Secondary | ICD-10-CM | POA: Diagnosis not present

## 2020-02-24 DIAGNOSIS — J301 Allergic rhinitis due to pollen: Secondary | ICD-10-CM | POA: Diagnosis not present

## 2020-02-28 DIAGNOSIS — J301 Allergic rhinitis due to pollen: Secondary | ICD-10-CM | POA: Diagnosis not present

## 2020-02-28 DIAGNOSIS — J3089 Other allergic rhinitis: Secondary | ICD-10-CM | POA: Diagnosis not present

## 2020-02-28 DIAGNOSIS — J3081 Allergic rhinitis due to animal (cat) (dog) hair and dander: Secondary | ICD-10-CM | POA: Diagnosis not present

## 2020-03-05 DIAGNOSIS — J3081 Allergic rhinitis due to animal (cat) (dog) hair and dander: Secondary | ICD-10-CM | POA: Diagnosis not present

## 2020-03-05 DIAGNOSIS — J301 Allergic rhinitis due to pollen: Secondary | ICD-10-CM | POA: Diagnosis not present

## 2020-03-05 DIAGNOSIS — J3089 Other allergic rhinitis: Secondary | ICD-10-CM | POA: Diagnosis not present

## 2020-03-09 ENCOUNTER — Other Ambulatory Visit: Payer: Self-pay

## 2020-03-09 ENCOUNTER — Telehealth: Payer: Self-pay

## 2020-03-09 DIAGNOSIS — G35 Multiple sclerosis: Secondary | ICD-10-CM

## 2020-03-09 NOTE — Telephone Encounter (Signed)
Epic closed down in the middle of creating note.  Patient needs referral to [facility] Neurology Dr. Tomi Likens. It has been 3 years since he has been seen, so office is requiring a referral.  Patient needs to be seen for Multiple sclerosis.  His work is requiring an update on his condition from Dr. Tomi Likens.  Patient can be reached at [phone]

## 2020-03-09 NOTE — Telephone Encounter (Signed)
Last neurology referral completed on 01/02/17. Patient was last seen for CPE on 01/17/20. Please advise if ok for referral

## 2020-03-09 NOTE — Telephone Encounter (Signed)
Yes pls order this referral

## 2020-03-09 NOTE — Telephone Encounter (Signed)
Referral entered, patient made aware someone will reach with scheduling details.

## 2020-03-09 NOTE — Telephone Encounter (Signed)
Patient needs referral to neurologist for his

## 2020-03-13 DIAGNOSIS — J3081 Allergic rhinitis due to animal (cat) (dog) hair and dander: Secondary | ICD-10-CM | POA: Diagnosis not present

## 2020-03-13 DIAGNOSIS — J301 Allergic rhinitis due to pollen: Secondary | ICD-10-CM | POA: Diagnosis not present

## 2020-03-13 DIAGNOSIS — J3089 Other allergic rhinitis: Secondary | ICD-10-CM | POA: Diagnosis not present

## 2020-03-22 NOTE — Progress Notes (Signed)
NEUROLOGY FOLLOW UP OFFICE NOTE  Brian Walsh [id number]  HISTORY OF PRESENT ILLNESS: Brian Walsh is a 59 year old right-handed male with asthma, PUD, BPH and OSA who follows up for multiple sclerosis.  UPDATE: Last seen in October 2018.  At that time, his condition was stable and he declined MRI and DMT.    Since then, he has been doing well.  He sometimes feels tired from time to time but resolves if he lays down to rest.  He has BPH and denies bladder dysfunction.  He has chronic constipation which is well managed. He denies sensory or motor deficits.  He denies cognitive deficits.  However, he has been under increased stress at work.  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  His company has downsized and therefore has had to carry on more work and responsibility.  Previously, he had two senior compensation analysts working under him, whom were let go.  He now carries there workload as well as other administrative responsibility.  He has been able to perform his job but has felt more overwhelmed and is naturally concerned of the potential negative impact it may have on his MS and overall health.  He would like a letter for his employer stating that he not be asked to stretch himself beyond what an individual in his position should appropriately handle.  HISTORY: In 1992, he developed onset of left lower extremity weakness that caused difficulty with gait and balance.  He was diagnosed with multiple sclerosis via brain MRI (multiple pericollosal lesions noted) and CSF analysis negative for oligoclonal bands but positive for elevated IgG index.  He was treated with IV SoluMedrol at that time.  He had two relapses in [place], presenting as blurred vision and incoordination, which also required treatment with SoluMedrol.  He has not had a relapse since 1996.  He has never been on a disease modifying drug.  He takes 2000 IU D3 daily, as well as calcium and zinc.  His last MRI was  over 10 years ago.   He tries to lead a healthy lifestyle with less red meat and more fish and green vegetables.  He exercises, usually early in the morning at a gym so to prevent from becoming overheated.  He has been under a lot of stress.  His wife has stage 4 colon cancer.  His son has epilepsy.  Work can be stressful.  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   PAST MEDICAL HISTORY: Past Medical History:  Diagnosis Date  . Allergic rhinitis    Much improved with allergy immunotherapy. Restart immunotherapy w/Dr. Whelan winter 2020 (cat/dog dander, pollens).  . Asthma    no inhaler requirement since allergies under control with immunotherapy  . Blood transfusion without reported diagnosis   . BPH (benign prostatic hypertrophy) 2014   TURP  . Chronic constipation   . Chronic sinusitis    Deviated nasal septum: >90% airway obstruction on right.  Dr. Wilburn Cornelia discussed possible septoplasty and turbinate reduction with pt in 2015.  Marland Kitchen Constipation, chronic 11/24/2011  . H/O blood clots    in eye   . H/O nonmelanoma skin cancer   . History of adenomatous polyp of colon    Most recent colonoscopy 07/2017---recall 5 yrs.  . IFG (impaired fasting glucose) 2019; 2021   A1c 5.8% 2019 and 2021.  Marland Kitchen Lower extremity venous stasis   . OSA on CPAP 2005   cpap nightly  .  Personal history of colonic polyps 03/24/2012   03/2012 - 5 mm cecal adenoma  . PUD (peptic ulcer disease)    Bleeding ulcer 1978  . Relapsing remitting multiple sclerosis ([place]) 1992   Remission since 1996 on no meds.  Initial presentation was gait/balance and leg weakness sx's.  Additional relapses with blurry vision and uncoordination.  Dr. Tomi Likens 04/2017 recommended re-estab baseline MRI brain and C-spine + start disease modifying therapy but pt declined.  . Sleep apnea    wears cpap   . Thyroid nodule 05/2012   Right lobe 52mmX14mm nodule found on screening thyroid and carotid u/s done through his employer.  Pt  was euthyroid at that time.  A radioactive iodine uptake and scan was normal 06/2012.  FNA bx 04/2015 was BENIGN/CYST.    MEDICATIONS: Current Outpatient Medications on File Prior to Visit  Medication Sig Dispense Refill  . albuterol (VENTOLIN HFA) 108 (90 Base) MCG/ACT inhaler Inhale 2 puffs into the lungs every 4 (four) hours as needed for shortness of breath. 18 g 0  . aspirin EC 81 MG tablet Take 81 mg by mouth every other day.     . B Complex-Folic Acid (B COMPLEX-VITAMIN B12 PO) Take 1 capsule by mouth daily.    . Calcium-Magnesium-Zinc 167-83-8 MG TABS Take by mouth 2 (two) times daily after a meal.    . cholecalciferol (VITAMIN D) 1000 units tablet Take 1,000 Units by mouth 2 (two) times daily.     Marland Kitchen EPINEPHrine 0.3 mg/0.3 mL IJ SOAJ injection INJECT INTO OUTER THIGH AS NEEDED FOR ANAPHYLAXIS (Patient not taking: Reported on 01/17/2020)    . fluorouracil (EFUDEX) 5 % cream Apply 1 application topically daily as needed.   0  . loratadine-pseudoephedrine (CLARITIN-D 24 HOUR) 10-240 MG 24 hr tablet Take 1 tablet by mouth daily. 90 tablet 1  . Olopatadine HCl 0.6 % SOLN Place 2 sprays into both nostrils 2 (two) times daily.    . psyllium (METAMUCIL) 58.6 % powder Take 1 packet by mouth 2 (two) times daily.     Marland Kitchen senna (SENOKOT) 8.6 MG tablet Take 2 tablets by mouth 2 (two) times daily as needed for constipation.    . sildenafil (REVATIO) 20 MG tablet 1-5 tabs po qd prn, take 30-60 min prior to intercourse 50 tablet 6  . vitamin C (ASCORBIC ACID) 500 MG tablet Take 500 mg by mouth as needed.      No current facility-administered medications on file prior to visit.    ALLERGIES: Allergies  Allergen Reactions  . Molds & Smuts Other (See Comments)    FAMILY HISTORY: Family History  Problem Relation Age of Onset  . Parkinsonism Father   . Stroke Father   . Colon polyps Father 66  . Breast cancer Mother   . Colon cancer Neg Hx   . Esophageal cancer Neg Hx   . Rectal cancer Neg Hx     . Stomach cancer Neg Hx     SOCIAL HISTORY: Social History   Socioeconomic History  . Marital status: Married    Spouse name: Not on file  . Number of children: 1  . Years of education: Not on file  . Highest education level: Not on file  Occupational History  . 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 Smoking status: Never Smoker  . Smokeless tobacco: Never Used  Vaping Use  . Vaping Use: Never used  Substance and Sexual Activity  .  Alcohol use: Yes    Alcohol/week: 1.0 standard drink    Types: 1 Glasses of wine per week    Comment: occasional wine   . Drug use: No  . Sexual activity: Not on file  Other Topics Concern  . Not on file  Social History Narrative   Married, has one 80 y/o son.   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   JD and MBA from [place].   No tobacco, 3-4 glasses of red wine per week, no drug use.   No exercise.   Gardens, plays tennis, works out at gym 5 days a week,. 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 Difficulty of Paying Living Expenses: Not on file  Food Insecurity:   . Worried About Charity fundraiser in the Last Year: Not on file  . Ran Out of Food in the Last Year: Not on file  Transportation Needs:   . Lack of Transportation (Medical): Not on file  . Lack of Transportation (Non-Medical): Not on file  Physical Activity:   . Days of Exercise per Week: Not on file  . Minutes of Exercise per Session: Not on file  Stress:   . Feeling of Stress : Not on file  Social Connections:   . Frequency of Communication with Friends and Family: Not on file  . Frequency of Social Gatherings with Friends and Family: Not on file  . Attends Religious Services: Not on file  . Active Member of Clubs or Organizations: Not on file  . Attends Archivist Meetings: Not on file  . Marital Status: Not on file   Intimate Partner Violence:   . Fear of Current or Ex-Partner: Not on file  . Emotionally Abused: Not on file  . Physically Abused: Not on file  . Sexually Abused: Not on file    PHYSICAL EXAM: Blood pressure (!) 160/97, pulse 78, resp. rate 18, height 5\' 10"  (1.778 m), weight 217 lb (98.4 kg), SpO2 96 %. General: No acute distress.  Patient appears well-groomed.   Head:  Normocephalic/atraumatic Eyes:  Fundi examined but not visualized Neck: supple, no paraspinal tenderness, full range of motion Heart:  Regular rate and rhythm Lungs:  Clear to auscultation bilaterally Back: No paraspinal tenderness Neurological Exam: alert and oriented to person, place, and time. Attention span and concentration intact, recalled 3 of 5 words after 5 minutes; remote memory intact, fund of knowledge intact.  Speech fluent and not dysarthric, language intact.  SLUMS score 28/30.  CN II-XII intact. Bulk and tone normal, muscle strength 5/5 throughout.  Sensation to pinprick and vibration intact.  Deep tendon reflexes 2+ throughout, toes downgoing.  Finger to nose and heel to shin testing intact.  Gait normal.  Mild unsteadiness with tandem walking but able to perform.  Romberg negative.  IMPRESSION: Multiple sclerosis.  Clinically stable off of DMT.  I will prepare a letter that he can submit to his employer. Follow up as needed.  Metta Clines, DO  CC:  [name], MD

## 2020-03-23 ENCOUNTER — Encounter: Payer: Self-pay | Admitting: Neurology

## 2020-03-23 ENCOUNTER — Ambulatory Visit: Payer: BC Managed Care – PPO | Admitting: Neurology

## 2020-03-23 ENCOUNTER — Other Ambulatory Visit: Payer: Self-pay

## 2020-03-23 VITALS — BP 160/97 | HR 78 | Resp 18 | Ht 70.0 in | Wt 217.0 lb

## 2020-03-23 DIAGNOSIS — G35 Multiple sclerosis: Secondary | ICD-10-CM | POA: Diagnosis not present

## 2020-03-23 DIAGNOSIS — J3081 Allergic rhinitis due to animal (cat) (dog) hair and dander: Secondary | ICD-10-CM | POA: Diagnosis not present

## 2020-03-23 DIAGNOSIS — J3089 Other allergic rhinitis: Secondary | ICD-10-CM | POA: Diagnosis not present

## 2020-03-23 DIAGNOSIS — J301 Allergic rhinitis due to pollen: Secondary | ICD-10-CM | POA: Diagnosis not present

## 2020-03-23 NOTE — Patient Instructions (Signed)
Physically, you are doing well.  I will prepare a letter that you may submit to your employer.  Follow up as needed.

## 2020-03-27 DIAGNOSIS — J3081 Allergic rhinitis due to animal (cat) (dog) hair and dander: Secondary | ICD-10-CM | POA: Diagnosis not present

## 2020-03-27 DIAGNOSIS — J3089 Other allergic rhinitis: Secondary | ICD-10-CM | POA: Diagnosis not present

## 2020-03-27 DIAGNOSIS — J301 Allergic rhinitis due to pollen: Secondary | ICD-10-CM | POA: Diagnosis not present

## 2020-04-09 DIAGNOSIS — J3089 Other allergic rhinitis: Secondary | ICD-10-CM | POA: Diagnosis not present

## 2020-04-09 DIAGNOSIS — J301 Allergic rhinitis due to pollen: Secondary | ICD-10-CM | POA: Diagnosis not present

## 2020-04-09 DIAGNOSIS — J3081 Allergic rhinitis due to animal (cat) (dog) hair and dander: Secondary | ICD-10-CM | POA: Diagnosis not present

## 2020-04-16 ENCOUNTER — Ambulatory Visit: Payer: BC Managed Care – PPO | Admitting: Neurology

## 2020-04-16 DIAGNOSIS — J301 Allergic rhinitis due to pollen: Secondary | ICD-10-CM | POA: Diagnosis not present

## 2020-04-16 DIAGNOSIS — J3081 Allergic rhinitis due to animal (cat) (dog) hair and dander: Secondary | ICD-10-CM | POA: Diagnosis not present

## 2020-04-17 DIAGNOSIS — J3089 Other allergic rhinitis: Secondary | ICD-10-CM | POA: Diagnosis not present

## 2020-04-23 DIAGNOSIS — J301 Allergic rhinitis due to pollen: Secondary | ICD-10-CM | POA: Diagnosis not present

## 2020-04-23 DIAGNOSIS — J3089 Other allergic rhinitis: Secondary | ICD-10-CM | POA: Diagnosis not present

## 2020-04-23 DIAGNOSIS — J3081 Allergic rhinitis due to animal (cat) (dog) hair and dander: Secondary | ICD-10-CM | POA: Diagnosis not present

## 2020-06-04 ENCOUNTER — Ambulatory Visit: Payer: BC Managed Care – PPO

## 2020-06-12 ENCOUNTER — Ambulatory Visit: Payer: BC Managed Care – PPO | Attending: Internal Medicine

## 2020-06-12 DIAGNOSIS — Z23 Encounter for immunization: Secondary | ICD-10-CM

## 2020-06-12 NOTE — Progress Notes (Signed)
   Covid-19 Vaccination Clinic  Name:  Montel Vanderhoof    MRN: [id number] DOB: 1960/08/07  06/12/2020  Mr. Howington was observed post Covid-19 immunization for 15 minutes without incident. He was provided with Vaccine Information Sheet and instruction to access the V-Safe system.   Mr. Holst was instructed to call 911 with any severe reactions post vaccine: Marland Kitchen Difficulty breathing  . Swelling of face and throat  . A fast heartbeat  . A bad rash all over body  . Dizziness and weakness   Immunizations Administered    No immunizations on file.

## 2020-06-18 DIAGNOSIS — D2262 Melanocytic nevi of left upper limb, including shoulder: Secondary | ICD-10-CM | POA: Diagnosis not present

## 2020-06-18 DIAGNOSIS — L570 Actinic keratosis: Secondary | ICD-10-CM | POA: Diagnosis not present

## 2020-06-18 DIAGNOSIS — L820 Inflamed seborrheic keratosis: Secondary | ICD-10-CM | POA: Diagnosis not present

## 2020-06-18 DIAGNOSIS — D2261 Melanocytic nevi of right upper limb, including shoulder: Secondary | ICD-10-CM | POA: Diagnosis not present

## 2020-06-18 DIAGNOSIS — D225 Melanocytic nevi of trunk: Secondary | ICD-10-CM | POA: Diagnosis not present

## 2020-06-18 DIAGNOSIS — L821 Other seborrheic keratosis: Secondary | ICD-10-CM | POA: Diagnosis not present

## 2020-08-31 ENCOUNTER — Telehealth: Payer: Self-pay | Admitting: Family Medicine

## 2020-08-31 MED ORDER — TADALAFIL 10 MG PO TABS: ORAL_TABLET | ORAL | 6 refills | Status: AC

## 2020-08-31 NOTE — Telephone Encounter (Signed)
Please advise of medication change

## 2020-08-31 NOTE — Telephone Encounter (Signed)
Patient states sildenafil prescription is not covered by his insurance and is too expensive. He would like it changed to either Viagra or Cialis. Please send to same [facility] on [address].

## 2020-08-31 NOTE — Telephone Encounter (Signed)
Pt advised refill sent. °

## 2020-08-31 NOTE — Telephone Encounter (Signed)
OK. I just sent in rx for cialis.

## 2020-11-16 DIAGNOSIS — J301 Allergic rhinitis due to pollen: Secondary | ICD-10-CM | POA: Diagnosis not present

## 2020-11-16 DIAGNOSIS — J3089 Other allergic rhinitis: Secondary | ICD-10-CM | POA: Diagnosis not present

## 2020-11-16 DIAGNOSIS — J3081 Allergic rhinitis due to animal (cat) (dog) hair and dander: Secondary | ICD-10-CM | POA: Diagnosis not present

## 2020-11-26 DIAGNOSIS — J301 Allergic rhinitis due to pollen: Secondary | ICD-10-CM | POA: Diagnosis not present

## 2020-11-26 DIAGNOSIS — J3081 Allergic rhinitis due to animal (cat) (dog) hair and dander: Secondary | ICD-10-CM | POA: Diagnosis not present

## 2020-11-27 DIAGNOSIS — J3089 Other allergic rhinitis: Secondary | ICD-10-CM | POA: Diagnosis not present

## 2020-12-06 DIAGNOSIS — J301 Allergic rhinitis due to pollen: Secondary | ICD-10-CM | POA: Diagnosis not present

## 2020-12-06 DIAGNOSIS — J3089 Other allergic rhinitis: Secondary | ICD-10-CM | POA: Diagnosis not present

## 2020-12-06 DIAGNOSIS — J3081 Allergic rhinitis due to animal (cat) (dog) hair and dander: Secondary | ICD-10-CM | POA: Diagnosis not present

## 2020-12-17 DIAGNOSIS — D1801 Hemangioma of skin and subcutaneous tissue: Secondary | ICD-10-CM | POA: Diagnosis not present

## 2020-12-17 DIAGNOSIS — L570 Actinic keratosis: Secondary | ICD-10-CM | POA: Diagnosis not present

## 2020-12-17 DIAGNOSIS — D2272 Melanocytic nevi of left lower limb, including hip: Secondary | ICD-10-CM | POA: Diagnosis not present

## 2020-12-17 DIAGNOSIS — L821 Other seborrheic keratosis: Secondary | ICD-10-CM | POA: Diagnosis not present

## 2020-12-17 DIAGNOSIS — D2262 Melanocytic nevi of left upper limb, including shoulder: Secondary | ICD-10-CM | POA: Diagnosis not present

## 2020-12-19 ENCOUNTER — Other Ambulatory Visit: Payer: Self-pay

## 2020-12-19 DIAGNOSIS — J3089 Other allergic rhinitis: Secondary | ICD-10-CM | POA: Diagnosis not present

## 2020-12-19 DIAGNOSIS — J301 Allergic rhinitis due to pollen: Secondary | ICD-10-CM | POA: Diagnosis not present

## 2020-12-19 MED ORDER — EPINEPHRINE 0.3 MG/0.3ML IJ SOAJ
INTRAMUSCULAR | 2 refills | Status: DC
Start: 2020-12-19 — End: 2022-10-31

## 2020-12-19 NOTE — Telephone Encounter (Signed)
RF request for Epipen LOV: 01/17/20 CPE Next ov: advised to f/u 1 year CPE Last written: 06/30/19, historical provider  Please Advise. Medication pending

## 2020-12-19 NOTE — Telephone Encounter (Signed)
Patient refill request  EPINEPHrine 0.3 mg/0.3 mL IJ SOAJ injection [[id number]]   [id number] - [place], [address] AT [address]

## 2020-12-20 NOTE — Telephone Encounter (Signed)
Pt advised refill sent for Epipen. He picked this up yesterday

## 2020-12-25 DIAGNOSIS — J3089 Other allergic rhinitis: Secondary | ICD-10-CM | POA: Diagnosis not present

## 2020-12-25 DIAGNOSIS — J3081 Allergic rhinitis due to animal (cat) (dog) hair and dander: Secondary | ICD-10-CM | POA: Diagnosis not present

## 2020-12-25 DIAGNOSIS — J301 Allergic rhinitis due to pollen: Secondary | ICD-10-CM | POA: Diagnosis not present

## 2020-12-31 DIAGNOSIS — J3081 Allergic rhinitis due to animal (cat) (dog) hair and dander: Secondary | ICD-10-CM | POA: Diagnosis not present

## 2020-12-31 DIAGNOSIS — J301 Allergic rhinitis due to pollen: Secondary | ICD-10-CM | POA: Diagnosis not present

## 2020-12-31 DIAGNOSIS — J3089 Other allergic rhinitis: Secondary | ICD-10-CM | POA: Diagnosis not present

## 2021-01-09 DIAGNOSIS — J301 Allergic rhinitis due to pollen: Secondary | ICD-10-CM | POA: Diagnosis not present

## 2021-01-09 DIAGNOSIS — J3089 Other allergic rhinitis: Secondary | ICD-10-CM | POA: Diagnosis not present

## 2021-01-09 DIAGNOSIS — J3081 Allergic rhinitis due to animal (cat) (dog) hair and dander: Secondary | ICD-10-CM | POA: Diagnosis not present

## 2021-01-15 DIAGNOSIS — J301 Allergic rhinitis due to pollen: Secondary | ICD-10-CM | POA: Diagnosis not present

## 2021-01-15 DIAGNOSIS — J3089 Other allergic rhinitis: Secondary | ICD-10-CM | POA: Diagnosis not present

## 2021-01-15 DIAGNOSIS — J3081 Allergic rhinitis due to animal (cat) (dog) hair and dander: Secondary | ICD-10-CM | POA: Diagnosis not present

## 2021-01-20 ENCOUNTER — Encounter: Payer: Self-pay | Admitting: Family Medicine

## 2021-01-23 DIAGNOSIS — J3081 Allergic rhinitis due to animal (cat) (dog) hair and dander: Secondary | ICD-10-CM | POA: Diagnosis not present

## 2021-01-23 DIAGNOSIS — J3089 Other allergic rhinitis: Secondary | ICD-10-CM | POA: Diagnosis not present

## 2021-01-23 DIAGNOSIS — J301 Allergic rhinitis due to pollen: Secondary | ICD-10-CM | POA: Diagnosis not present

## 2021-01-31 DIAGNOSIS — J3089 Other allergic rhinitis: Secondary | ICD-10-CM | POA: Diagnosis not present

## 2021-01-31 DIAGNOSIS — J3081 Allergic rhinitis due to animal (cat) (dog) hair and dander: Secondary | ICD-10-CM | POA: Diagnosis not present

## 2021-01-31 DIAGNOSIS — J301 Allergic rhinitis due to pollen: Secondary | ICD-10-CM | POA: Diagnosis not present

## 2021-02-04 DIAGNOSIS — Z86718 Personal history of other venous thrombosis and embolism: Secondary | ICD-10-CM | POA: Insufficient documentation

## 2021-02-04 DIAGNOSIS — I878 Other specified disorders of veins: Secondary | ICD-10-CM | POA: Insufficient documentation

## 2021-02-04 DIAGNOSIS — R7301 Impaired fasting glucose: Secondary | ICD-10-CM | POA: Insufficient documentation

## 2021-02-04 DIAGNOSIS — Z85828 Personal history of other malignant neoplasm of skin: Secondary | ICD-10-CM | POA: Insufficient documentation

## 2021-02-05 ENCOUNTER — Ambulatory Visit: Payer: BC Managed Care – PPO | Admitting: Cardiology

## 2021-02-05 ENCOUNTER — Encounter: Payer: Self-pay | Admitting: Cardiology

## 2021-02-05 ENCOUNTER — Other Ambulatory Visit: Payer: Self-pay

## 2021-02-05 VITALS — BP 150/98 | HR 61 | Ht 69.0 in | Wt 228.0 lb

## 2021-02-05 DIAGNOSIS — Z9989 Dependence on other enabling machines and devices: Secondary | ICD-10-CM

## 2021-02-05 DIAGNOSIS — G4733 Obstructive sleep apnea (adult) (pediatric): Secondary | ICD-10-CM | POA: Diagnosis not present

## 2021-02-05 DIAGNOSIS — I10 Essential (primary) hypertension: Secondary | ICD-10-CM | POA: Diagnosis not present

## 2021-02-05 DIAGNOSIS — I878 Other specified disorders of veins: Secondary | ICD-10-CM | POA: Diagnosis not present

## 2021-02-05 MED ORDER — LOSARTAN POTASSIUM 25 MG PO TABS: 25.0000 mg | ORAL_TABLET | Freq: Every day | ORAL | 3 refills | Status: DC

## 2021-02-05 NOTE — Progress Notes (Signed)
Cardiology Consultation:    Date:  02/05/2021   ID:  Brian Walsh, DOB September 05, 1960, MRN [id number]  PCP:  Tammi Sou, MD  Cardiologist:  Jenne Campus, MD   Referring MD: Tammi Sou, MD   Chief Complaint  Patient presents with   Leg Swelling    Hx heart disease     History of Present Illness:    Brian Walsh is a 60 y.o. male who is being seen today for the evaluation of swelling of lower extremities at the request of McGowen, Adrian Blackwater, MD. he is a nice gentleman with a past medical history significant for multiple sclerosis about 30 years, obstructive sleep apnea, he does have swelling of lower extremities likely become more pronounced.  He is wife is a retired congestive heart failure as well as a Nurse, learning disability and she asked him to come in and see cardiologist.  He is always very active he exercise about twice a day walking for up to 3 miles.  She he have no difficulty doing it.  He did not endorse any chest pain tightness squeezing pressure burning chest he did not notice any increase in shortness of breath while walking.  Swelling of lower extremities special evening, however became more pronounced.  He does have a sleep apnea CPAP mask for many years and he said he is very happy with this he thinks it helps him a lot.  Also recently noticed increased blood pressure he checked his blood pressure on the regular basis for the last 2 years he noted his blood pressure is creeping up.  To the point that he start worrying about it.  Also described to have some headaches sometimes.  He does not smoke, never did.  Active.  Not on any special diet.  Does have family history of heart problems but not premature.  Past Medical History:  Diagnosis Date   Allergic rhinitis    Much improved with allergy immunotherapy. Restart immunotherapy w/Dr. Whelan winter 2020 (cat/dog dander, pollens).   Asthma    no inhaler requirement since allergies under control with immunotherapy    Blood transfusion without reported diagnosis    BPH (benign prostatic hypertrophy) 2014   TURP   Chronic constipation    Chronic sinusitis    Deviated nasal septum: >90% airway obstruction on right.  Dr. Wilburn Cornelia discussed possible septoplasty and turbinate reduction with pt in 2015.   Constipation, chronic 11/24/2011   H/O blood clots    in eye    H/O nonmelanoma skin cancer    History of adenomatous polyp of colon    Most recent colonoscopy 07/2017---recall 5 yrs.   IFG (impaired fasting glucose) 2019; 2021   A1c 5.8% 2019 and 2021.   Lower extremity venous stasis    OSA on CPAP 2005   cpap nightly   Personal history of colonic polyps 03/24/2012   03/2012 - 5 mm cecal adenoma   PUD (peptic ulcer disease)    Bleeding ulcer 1978   Relapsing remitting multiple sclerosis ([place]) 1992   Remission since 1996 on no meds.  Initial presentation was gait/balance and leg weakness sx's.  Additional relapses with blurry vision and uncoordination.  Dr. Tomi Likens 04/2017 recommended re-estab baseline MRI brain and C-spine + start disease modifying therapy but pt declined.   Sleep apnea    wears cpap    Thyroid nodule 05/2012   Right lobe 52mX14mm nodule found on screening thyroid and carotid u/s done through his employer.  Pt was euthyroid  at that time.  A radioactive iodine uptake and scan was normal 06/2012.  FNA bx 04/2015 was BENIGN/CYST.    Past Surgical History:  Procedure Laterality Date   APPENDECTOMY  1986   COLONOSCOPY  2006, 2013; 07/2017   diminutive hyperplastic cecal polyp ([place]); diminutive cecal adenoma removed 2013.  Repeat 07/24/17, one polyp--recall 5 yrs.   FEMUR FRACTURE SURGERY  1978   with rod insertion.  Rod removal 1979   FNA R Thyroid nodule  04/17/15   Benign/cystic (Dr. Cruzita Lederer)   Hypoluxo Right 10/22/2015   Procedure: LAPAROSCOPIC RIGHT INGUINAL HERNIA REPAIR;  Surgeon: Mickeal Skinner, MD;  Location: [facility];  Service: General;   Laterality: Right;   INSERTION OF MESH Right 10/22/2015   Procedure: INSERTION OF MESH;  Surgeon: Mickeal Skinner, MD;  Location: [facility];  Service: General;  Laterality: Right;   TRANSURETHRAL RESECTION OF PROSTATE N/A 08/25/2012   Procedure: TRANSURETHRAL RESECTION OF THE PROSTATE WITH GYRUS INSTRUMENTS;  Surgeon: Alexis Frock, MD;  Location: [facility];  Service: Urology;  Laterality: N/A;--patient got excellent results from procedure.    Current Medications: Current Meds  Medication Sig   albuterol (VENTOLIN HFA) 108 (90 Base) MCG/ACT inhaler Inhale 2 puffs into the lungs every 4 (four) hours as needed for shortness of breath.   aspirin EC 81 MG tablet Take 81 mg by mouth daily.   B Complex-Folic Acid (B COMPLEX-VITAMIN B12 PO) Take 1 capsule by mouth daily. Unknown strenght   Calcium-Magnesium-Zinc 167-83-8 MG TABS Take by mouth 2 (two) times daily after a meal.   cholecalciferol (VITAMIN D) 1000 units tablet Take 1,000 Units by mouth daily.   EPINEPHrine 0.3 mg/0.3 mL IJ SOAJ injection INJECT INTO OUTER THIGH AS NEEDED FOR ANAPHYLAXIS (Patient taking differently: Inject 0.3 mg into the muscle as needed for anaphylaxis. INJECT INTO OUTER THIGH AS NEEDED FOR ANAPHYLAXIS)   fluorouracil (EFUDEX) 5 % cream Apply 1 application topically daily as needed (rash).   Olopatadine HCl 0.6 % SOLN Place 2 sprays into both nostrils 2 (two) times daily.   psyllium (METAMUCIL) 58.6 % powder Take 1 packet by mouth 2 (two) times daily.    senna (SENOKOT) 8.6 MG tablet Take 2 tablets by mouth 2 (two) times daily as needed for constipation.   tadalafil (CIALIS) 10 MG tablet 1-2 tabs po qd prn (Patient taking differently: Take 10 mg by mouth daily as needed for erectile dysfunction. 1-2 tabs po qd prn)   vitamin C (ASCORBIC ACID) 500 MG tablet Take 500 mg by mouth daily.     Allergies:   Molds & smuts   Social History   Socioeconomic History   Marital status: Married    Spouse name:  Not on file   Number of children: 1   Years of education: Not on file   Highest education level: Not on file  Occupational History   Occupation: HR manager    Employer: [employer]  Tobacco Use   Smoking status: Never   Smokeless tobacco: Never  Vaping Use   Vaping Use: Never used  Substance and Sexual Activity   Alcohol use: Yes    Alcohol/week: 1.0 standard drink    Types: 1 Glasses of wine per week    Comment: occasional wine    Drug use: No   Sexual activity: Not on file  Other Topics Concern   Not on file  Social History Narrative   Married, has one 36 y/o  son.   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   JD and MBA from [place].   No tobacco, 3-4 glasses of red wine per week, no drug use.   No exercise.   [place]ardens, plays tennis, works out at gym 5 days a week,. He has a law degree-04/21/17-sjb   Right handed   2 story home   Caffeine intake yes   Social Determinants of Health   Financial Resource Strain: Not on file  Food Insecurity: Not on file  Transportation Needs: Not on file  Physical Activity: Not on file  Stress: Not on file  Social Connections: Not on file     Family History: The patient's family history includes Breast cancer in his mother; Colon polyps (age of onset: 27) in his father; Parkinsonism in his father; Stroke in his father. There is no history of Colon cancer, Esophageal cancer, Rectal cancer, or Stomach cancer. ROS:   Please see the history of present illness.    All 14 point review of systems negative except as described per history of present illness.  EKGs/Labs/Other Studies Reviewed:    The following studies were reviewed today:   EKG:  EKG is  ordered today.  The ekg ordered today demonstrates normal sinus rhythm, normal P interval, cannot rule out anterior wall MI, nonspecific ST segment changes  Recent Labs: No results found for requested labs within last 8760 hours.   Recent Lipid Panel    Component Value Date/Time   CHOL 161 01/17/2020 0843   TRIG 66.0 01/17/2020 0843   HDL 60.90 01/17/2020 0843   CHOLHDL 3 01/17/2020 0843   VLDL 13.2 01/17/2020 0843   LDLCALC 87 01/17/2020 0843    Physical Exam:    VS:  BP (!) 150/98 (BP Location: Right Arm, Patient Position: Sitting)   Pulse 61   Ht '5\' 9"'$  (1.753 m)   Wt 228 lb (103.4 kg)   SpO2 98%   BMI 33.67 kg/m     Wt Readings from Last 3 Encounters:  02/05/21 228 lb (103.4 kg)  03/23/20 217 lb (98.4 kg)  01/17/20 212 lb 12.8 oz (96.5 kg)     GEN:  Well nourished, well developed in no acute distress HEENT: Normal NECK: No JVD; No carotid bruits LYMPHATICS: No lymphadenopathy CARDIAC: RRR, no murmurs, no rubs, no gallops RESPIRATORY:  Clear to auscultation without rales, wheezing or rhonchi  ABDOMEN: Soft, non-tender, non-distended MUSCULOSKELETAL:  No edema; No deformity  SKIN: Warm and dry NEUROLOGIC:  Alert and oriented x 3 PSYCHIATRIC:  Normal affect   ASSESSMENT:    1. Lower extremity venous stasis   2. OSA on CPAP   3. Essential hypertension    PLAN:    In order of problems listed above:  Lower extremities edema.  Worse at evening time pitting characteristic today maybe 1+ the most.  We will do echocardiogram to assess left ventricle ejection fraction, diastolic function as well as right ventricle size and function.  I will not initiate any medication at this stage.  I will ask him to have proBNP done. Obstructive sleep apnea apparently very happy with the equipment in the future we may consider referral to our sleep specialist to make sure everything is correct with this problem.  I am worried his blood pressure may be creeping up because of sleep apnea. Essential hypertension we will check Chem-7 today if Chem-7 is fine we will do start losartan 25 mg daily.  I will  wait also for results of echocardiogram to see potential left ventricle hypertrophy. We did talk about healthy  lifestyle which she already does.  I asked him to reduce his salt intake and keep exercising. Multiple sclerosis for 30 years doing well from that point review.   Medication Adjustments/Labs and Tests Ordered: Current medicines are reviewed at length with the patient today.  Concerns regarding medicines are outlined above.  No orders of the defined types were placed in this encounter.  No orders of the defined types were placed in this encounter.   Signed, Park Liter, MD, [facility]. 02/05/2021 10:43 AM    [place]

## 2021-02-05 NOTE — Patient Instructions (Signed)
Medication Instructions:  Your physician has recommended you make the following change in your medication: START: Losartan 25 mg once daily  *If you need a refill on your cardiac medications before your next appointment, please call your pharmacy*   Lab Work: Your physician recommends that you return for lab work in:  TODAY: ProBNP, BMET If you have labs (blood work) drawn today and your tests are completely normal, you will receive your results only by: [place] (if you have [place]) OR A paper copy in the mail If you have any lab test that is abnormal or we need to change your treatment, we will call you to review the results.   Testing/Procedures: Your physician has requested that you have an echocardiogram. Echocardiography is a painless test that uses sound waves to create images of your heart. It provides your doctor with information about the size and shape of your heart and how well your heart's chambers and valves are working. This procedure takes approximately one hour. There are no restrictions for this procedure.    Follow-Up: At [facility], you and your health needs are our priority.  As part of our continuing mission to provide you with exceptional heart care, we have created designated Provider Care Teams.  These Care Teams include your primary Cardiologist (physician) and Advanced Practice Providers (APPs -  Physician Assistants and Nurse Practitioners) who all work together to provide you with the care you need, when you need it.  We recommend signing up for the patient portal called "MyChart".  Sign up information is provided on this After Visit Summary.  MyChart is used to connect with patients for Virtual Visits (Telemedicine).  Patients are able to view lab/test results, encounter notes, upcoming appointments, etc.  Non-urgent messages can be sent to your provider as well.   To learn more about what you can do with MyChart, go to [email or web address].     Your next appointment:   2 month(s)  The format for your next appointment:   In Person  Provider:   Jenne Campus, MD   Other Instructions Echocardiogram An echocardiogram is a test that uses sound waves (ultrasound) to produce images of the heart. Images from an echocardiogram can provide important information about: Heart size and shape. The size and thickness and movement of your heart's walls. Heart muscle function and strength. Heart valve function or if you have stenosis. Stenosis is when the heart valves are too narrow. If blood is flowing backward through the heart valves (regurgitation). A tumor or infectious growth around the heart valves. Areas of heart muscle that are not working well because of poor blood flow or injury from a heart attack. Aneurysm detection. An aneurysm is a weak or damaged part of an artery wall. The wall bulges out from the normal force of blood pumping through the body. Tell a health care provider about: Any allergies you have. All medicines you are taking, including vitamins, herbs, eye drops, creams, and over-the-counter medicines. Any blood disorders you have. Any surgeries you have had. Any medical conditions you have. Whether you are pregnant or may be pregnant. What are the risks? Generally, this is a safe test. However, problems may occur, including an allergic reaction to dye (contrast) that may be used during the test. What happens before the test? No specific preparation is needed. You may eat and drink normally. What happens during the test?  You will take off your clothes from the waist up and put on a hospital gown.  Electrodes or electrocardiogram (ECG)patches may be placed on your chest. The electrodes or patches are then connected to a device that monitors your heart rate and rhythm. You will lie down on a table for an ultrasound exam. A gel will be applied to your chest to help sound waves pass through your skin. A  handheld device, called a transducer, will be pressed against your chest and moved over your heart. The transducer produces sound waves that travel to your heart and bounce back (or "echo" back) to the transducer. These sound waves will be captured in real-time and changed into images of your heart that can be viewed on a video monitor. The images will be recorded on a computer and reviewed by your health care provider. You may be asked to change positions or hold your breath for a short time. This makes it easier to get different views or better views of your heart. In some cases, you may receive contrast through an IV in one of your veins. This can improve the quality of the pictures from your heart. The procedure may vary among health care providers and hospitals. What can I expect after the test? You may return to your normal, everyday life, including diet, activities, andmedicines, unless your health care provider tells you not to do that. Follow these instructions at home: It is up to you to get the results of your test. Ask your health care provider, or the department that is doing the test, when your results will be ready. Keep all follow-up visits. This is important. Summary An echocardiogram is a test that uses sound waves (ultrasound) to produce images of the heart. Images from an echocardiogram can provide important information about the size and shape of your heart, heart muscle function, heart valve function, and other possible heart problems. You do not need to do anything to prepare before this test. You may eat and drink normally. After the echocardiogram is completed, you may return to your normal, everyday life, unless your health care provider tells you not to do that. This information is not intended to replace advice given to you by your health care provider. Make sure you discuss any questions you have with your healthcare provider. Document Revised: 02/21/2020 Document  Reviewed: 02/21/2020 Elsevier Patient Education  2022 [employer].

## 2021-02-06 LAB — BASIC METABOLIC PANEL
BUN/Creatinine Ratio: 13 (ref 10–24)
BUN: 13 mg/dL (ref 8–27)
CO2: 25 mmol/L (ref 20–29)
Calcium: 9.8 mg/dL (ref 8.6–10.2)
Chloride: 104 mmol/L (ref 96–106)
Creatinine, Ser: 1 mg/dL (ref 0.76–1.27)
Glucose: 108 mg/dL — ABNORMAL HIGH (ref 65–99)
Potassium: 5.1 mmol/L (ref 3.5–5.2)
Sodium: 142 mmol/L (ref 134–144)
eGFR: 86 mL/min/{1.73_m2} (ref >59)

## 2021-02-06 LAB — PRO B NATRIURETIC PEPTIDE: NT-Pro BNP: 61 pg/mL (ref 0–210)

## 2021-02-07 ENCOUNTER — Telehealth: Payer: Self-pay

## 2021-02-07 NOTE — Telephone Encounter (Signed)
Left message on patients voicemail to please return our call.   

## 2021-02-07 NOTE — Telephone Encounter (Signed)
-----   Message from Park Liter, MD sent at 02/07/2021 12:24 PM EDT ----- Blood test showing no evidence of congestive heart failure, kidney function normal.  We will wait for results of echocardiogram with therapy

## 2021-02-08 ENCOUNTER — Telehealth: Payer: Self-pay

## 2021-02-08 NOTE — Telephone Encounter (Signed)
Spoke with patient regarding results and recommendation.  Patient verbalizes understanding and is agreeable to plan of care. Advised patient to call back with any issues or concerns.  

## 2021-02-08 NOTE — Telephone Encounter (Signed)
-----   Message from Park Liter, MD sent at 02/07/2021 12:24 PM EDT ----- Blood test showing no evidence of congestive heart failure, kidney function normal.  We will wait for results of echocardiogram with therapy

## 2021-02-13 DIAGNOSIS — J3081 Allergic rhinitis due to animal (cat) (dog) hair and dander: Secondary | ICD-10-CM | POA: Diagnosis not present

## 2021-02-13 DIAGNOSIS — J301 Allergic rhinitis due to pollen: Secondary | ICD-10-CM | POA: Diagnosis not present

## 2021-02-13 DIAGNOSIS — J3089 Other allergic rhinitis: Secondary | ICD-10-CM | POA: Diagnosis not present

## 2021-02-19 DIAGNOSIS — J3081 Allergic rhinitis due to animal (cat) (dog) hair and dander: Secondary | ICD-10-CM | POA: Diagnosis not present

## 2021-02-19 DIAGNOSIS — J3089 Other allergic rhinitis: Secondary | ICD-10-CM | POA: Diagnosis not present

## 2021-02-19 DIAGNOSIS — J301 Allergic rhinitis due to pollen: Secondary | ICD-10-CM | POA: Diagnosis not present

## 2021-02-20 ENCOUNTER — Ambulatory Visit: Payer: BC Managed Care – PPO | Admitting: Cardiology

## 2021-02-21 ENCOUNTER — Ambulatory Visit (HOSPITAL_COMMUNITY): Payer: BC Managed Care – PPO | Attending: Cardiology

## 2021-02-21 ENCOUNTER — Other Ambulatory Visit: Payer: Self-pay

## 2021-02-21 DIAGNOSIS — I878 Other specified disorders of veins: Secondary | ICD-10-CM | POA: Diagnosis not present

## 2021-02-21 DIAGNOSIS — R600 Localized edema: Secondary | ICD-10-CM | POA: Diagnosis not present

## 2021-02-21 DIAGNOSIS — I10 Essential (primary) hypertension: Secondary | ICD-10-CM | POA: Diagnosis not present

## 2021-02-21 HISTORY — PX: TRANSTHORACIC ECHOCARDIOGRAM: SHX275

## 2021-02-21 LAB — ECHOCARDIOGRAM COMPLETE
Area-P 1/2: 4.21 cm2
S' Lateral: 3.1 cm

## 2021-02-25 DIAGNOSIS — J3089 Other allergic rhinitis: Secondary | ICD-10-CM | POA: Diagnosis not present

## 2021-02-25 DIAGNOSIS — J301 Allergic rhinitis due to pollen: Secondary | ICD-10-CM | POA: Diagnosis not present

## 2021-02-25 DIAGNOSIS — J3081 Allergic rhinitis due to animal (cat) (dog) hair and dander: Secondary | ICD-10-CM | POA: Diagnosis not present

## 2021-03-13 DIAGNOSIS — R062 Wheezing: Secondary | ICD-10-CM | POA: Insufficient documentation

## 2021-03-13 DIAGNOSIS — J301 Allergic rhinitis due to pollen: Secondary | ICD-10-CM | POA: Insufficient documentation

## 2021-03-13 DIAGNOSIS — J3081 Allergic rhinitis due to animal (cat) (dog) hair and dander: Secondary | ICD-10-CM | POA: Insufficient documentation

## 2021-03-14 ENCOUNTER — Encounter: Payer: Self-pay | Admitting: Family Medicine

## 2021-03-14 ENCOUNTER — Ambulatory Visit: Payer: Managed Care, Other (non HMO) | Admitting: Family Medicine

## 2021-03-14 ENCOUNTER — Other Ambulatory Visit: Payer: Self-pay

## 2021-03-14 VITALS — BP 135/85 | HR 62 | Temp 97.8°F | Ht 69.0 in | Wt 230.2 lb

## 2021-03-14 DIAGNOSIS — L821 Other seborrheic keratosis: Secondary | ICD-10-CM

## 2021-03-14 DIAGNOSIS — D229 Melanocytic nevi, unspecified: Secondary | ICD-10-CM

## 2021-03-14 NOTE — Progress Notes (Signed)
OFFICE VISIT  03/14/2021  CC:  Chief Complaint  Patient presents with   Check groin area    Does a lot of yard work; unsure if it was an ingrown hair and started to get bigger. No redness or black spot noticed    HPI:    Patient is a 60 y.o. Caucasian male who presents for "check groin area". Found a little bump on skin in R perineum area about 5-6 wks ago.  He first noted it when cleaning himself thoroughly after working in the yard. Has gradually enlarged. No drainage or tenderness or itching.    Past Medical History:  Diagnosis Date   Allergic rhinitis    Much improved with allergy immunotherapy. Restart immunotherapy w/Dr. Whelan winter 2020 (cat/dog dander, pollens).   Asthma    no inhaler requirement since allergies under control with immunotherapy   Blood transfusion without reported diagnosis    BPH (benign prostatic hypertrophy) 2014   TURP   Chronic constipation    Chronic sinusitis    Deviated nasal septum: >90% airway obstruction on right.  Dr. Wilburn Cornelia discussed possible septoplasty and turbinate reduction with pt in 2015.   Constipation, chronic 11/24/2011   H/O blood clots    in eye    H/O nonmelanoma skin cancer    History of adenomatous polyp of colon    Most recent colonoscopy 07/2017---recall 5 yrs.   IFG (impaired fasting glucose) 2019; 2021   A1c 5.8% 2019 and 2021.   Lower extremity venous stasis    OSA on CPAP 2005   cpap nightly   Personal history of colonic polyps 03/24/2012   03/2012 - 5 mm cecal adenoma   PUD (peptic ulcer disease)    Bleeding ulcer 1978   Relapsing remitting multiple sclerosis ([place]) 1992   Remission since 1996 on no meds.  Initial presentation was gait/balance and leg weakness sx's.  Additional relapses with blurry vision and uncoordination.  Dr. Tomi Likens 04/2017 recommended re-estab baseline MRI brain and C-spine + start disease modifying therapy but pt declined.   Sleep apnea    wears cpap    Thyroid nodule 05/2012   Right lobe  21mX14mm nodule found on screening thyroid and carotid u/s done through his employer.  Pt was euthyroid at that time.  A radioactive iodine uptake and scan was normal 06/2012.  FNA bx 04/2015 was BENIGN/CYST.    Past Surgical History:  Procedure Laterality Date   APPENDECTOMY  1986   COLONOSCOPY  2006, 2013; 07/2017   diminutive hyperplastic cecal polyp ([place]); diminutive cecal adenoma removed 2013.  Repeat 07/24/17, one polyp--recall 5 yrs.   FEMUR FRACTURE SURGERY  1978   with rod insertion.  Rod removal 1979   FNA R Thyroid nodule  04/17/15   Benign/cystic (Dr. [place]ita [place]ederer   [place]   Procedure: LAPAROSCOPIC RIGHT INGUINAL HERNIA REPAIR;  Surgeon: LMickeal Skinner MD;  [facility]  Service: General;  Laterality: Right;   INSERTION OF MESH Right 10/22/2015   Procedure: INSERTION OF MESH;  Surgeon: LMickeal Skinner MD;  [facility]  Service: General;  Laterality: Right;   TRANSURETHRAL RESECTION OF PROSTATE N/A 08/25/2012   Procedure: TRANSURETHRAL RESECTION OF THE PROSTATE WITH GYRUS INSTRUMENTS;  Surgeon: TAlexis Frock MD;  Location: [facility];  Service: Urology;  Laterality: N/A;--patient got excellent results from procedure.    Outpatient Medications Prior to Visit  Medication Sig Dispense Refill   aspirin EC 81 MG tablet Take  81 mg by mouth daily.     B Complex-Folic Acid (B COMPLEX-VITAMIN B12 PO) Take 1 capsule by mouth daily. Unknown strenght     Calcium-Magnesium-Zinc 167-83-8 MG TABS Take by mouth 2 (two) times daily after a meal.     cholecalciferol (VITAMIN D) 1000 units tablet Take 1,000 Units by mouth daily.     ePHEDrine HCl (PRIMATENE) 12.5 MG TABS Take by mouth as needed.     fluorouracil (EFUDEX) 5 % cream Apply 1 application topically daily as needed (rash).  0   losartan (COZAAR) 25 MG tablet Take 1 tablet (25 mg total) by mouth daily. 90 tablet 3   Olopatadine HCl 0.6 % SOLN Place  2 sprays into both nostrils 2 (two) times daily.     psyllium (METAMUCIL) 58.6 % powder Take 1 packet by mouth 2 (two) times daily.      senna (SENOKOT) 8.6 MG tablet Take 2 tablets by mouth 2 (two) times daily as needed for constipation.     tadalafil (CIALIS) 10 MG tablet 1-2 tabs po qd prn (Patient taking differently: Take 10 mg by mouth daily as needed for erectile dysfunction. 1-2 tabs po qd prn) 10 tablet 6   vitamin C (ASCORBIC ACID) 500 MG tablet Take 500 mg by mouth daily.     albuterol (VENTOLIN HFA) 108 (90 Base) MCG/ACT inhaler Inhale 2 puffs into the lungs every 4 (four) hours as needed for shortness of breath. (Patient not taking: Reported on 03/14/2021) 18 g 0   EPINEPHrine 0.3 mg/0.3 mL IJ SOAJ injection INJECT INTO OUTER THIGH AS NEEDED FOR ANAPHYLAXIS (Patient not taking: Reported on 03/14/2021) 1 each 2   No facility-administered medications prior to visit.    Allergies  Allergen Reactions   Molds & Smuts Other (See Comments)    ROS As per HPI  PE: Vitals with BMI 03/14/2021 02/05/2021 03/23/2020  Height '5\' 9"'$  '5\' 9"'$  '5\' 10"'$   Weight 230 lbs 3 oz 228 lbs 217 lbs  BMI 33.98 [id number]  Systolic [id number]  Diastolic 85 98 97  Pulse 62 61 78   Gen: Alert, well appearing.  Patient is oriented to person, place, time, and situation. AFFECT: pleasant, lucid thought and speech. Skin: right perineum crease with 2 mm x 94m oval light brown papule.  LABS:  Lab Results  Component Value Date   WBC 5.8 01/17/2020   HGB 14.7 01/17/2020   HCT 43.9 01/17/2020   MCV 94.5 01/17/2020   PLT 255.0 01/17/2020     Chemistry      Component Value Date/Time   NA 142 02/05/2021 1112   K 5.1 02/05/2021 1112   CL 104 02/05/2021 1112   CO2 25 02/05/2021 1112   BUN 13 02/05/2021 1112   CREATININE 1.00 02/05/2021 1112   GLU 113 12/29/2018 0000      Component Value Date/Time   CALCIUM 9.8 02/05/2021 1112   ALKPHOS 65 01/17/2020 0843   AST 20 01/17/2020 0843   ALT 18 01/17/2020 0843    BILITOT 0.6 01/17/2020 0843     IMPRESSION AND PLAN:  Skin lesion in perineum, gradually enlarging (<0.5cm). Does not appear to clearly be benign so we elected to shave it off and send to pathology.  Shave excision: consent obtained. 1 cc of 1% lidocaine with epi used for local anesthesia. Dermablade used to excise.  Pt tolerated procedure well, no immediate complications, no bleeding. Wound dressed, home care discussed. Specimen sent to pathology.  An After Visit Summary  was printed and given to the patient.  FOLLOW UP: Return for as needed.  Signed:  Crissie Sickles, MD           03/14/2021

## 2021-03-15 DIAGNOSIS — J301 Allergic rhinitis due to pollen: Secondary | ICD-10-CM | POA: Diagnosis not present

## 2021-03-15 DIAGNOSIS — J3089 Other allergic rhinitis: Secondary | ICD-10-CM | POA: Diagnosis not present

## 2021-03-15 DIAGNOSIS — J3081 Allergic rhinitis due to animal (cat) (dog) hair and dander: Secondary | ICD-10-CM | POA: Diagnosis not present

## 2021-03-21 DIAGNOSIS — J301 Allergic rhinitis due to pollen: Secondary | ICD-10-CM | POA: Diagnosis not present

## 2021-03-21 DIAGNOSIS — J3081 Allergic rhinitis due to animal (cat) (dog) hair and dander: Secondary | ICD-10-CM | POA: Diagnosis not present

## 2021-03-21 DIAGNOSIS — J3089 Other allergic rhinitis: Secondary | ICD-10-CM | POA: Diagnosis not present

## 2021-03-27 DIAGNOSIS — J3081 Allergic rhinitis due to animal (cat) (dog) hair and dander: Secondary | ICD-10-CM | POA: Diagnosis not present

## 2021-03-27 DIAGNOSIS — J3089 Other allergic rhinitis: Secondary | ICD-10-CM | POA: Diagnosis not present

## 2021-03-27 DIAGNOSIS — J301 Allergic rhinitis due to pollen: Secondary | ICD-10-CM | POA: Diagnosis not present

## 2021-04-02 DIAGNOSIS — J3089 Other allergic rhinitis: Secondary | ICD-10-CM | POA: Diagnosis not present

## 2021-04-02 DIAGNOSIS — J3081 Allergic rhinitis due to animal (cat) (dog) hair and dander: Secondary | ICD-10-CM | POA: Diagnosis not present

## 2021-04-02 DIAGNOSIS — J301 Allergic rhinitis due to pollen: Secondary | ICD-10-CM | POA: Diagnosis not present

## 2021-04-10 DIAGNOSIS — J301 Allergic rhinitis due to pollen: Secondary | ICD-10-CM | POA: Diagnosis not present

## 2021-04-10 DIAGNOSIS — J3081 Allergic rhinitis due to animal (cat) (dog) hair and dander: Secondary | ICD-10-CM | POA: Diagnosis not present

## 2021-04-10 DIAGNOSIS — J3089 Other allergic rhinitis: Secondary | ICD-10-CM | POA: Diagnosis not present

## 2021-04-17 DIAGNOSIS — J3089 Other allergic rhinitis: Secondary | ICD-10-CM | POA: Diagnosis not present

## 2021-04-17 DIAGNOSIS — J3081 Allergic rhinitis due to animal (cat) (dog) hair and dander: Secondary | ICD-10-CM | POA: Diagnosis not present

## 2021-04-17 DIAGNOSIS — J301 Allergic rhinitis due to pollen: Secondary | ICD-10-CM | POA: Diagnosis not present

## 2021-04-18 ENCOUNTER — Other Ambulatory Visit: Payer: Self-pay

## 2021-04-18 ENCOUNTER — Encounter: Payer: Self-pay | Admitting: Cardiology

## 2021-04-18 ENCOUNTER — Ambulatory Visit: Payer: Managed Care, Other (non HMO) | Admitting: Cardiology

## 2021-04-18 VITALS — BP 118/46 | HR 65 | Ht 70.0 in | Wt 229.0 lb

## 2021-04-18 DIAGNOSIS — I10 Essential (primary) hypertension: Secondary | ICD-10-CM

## 2021-04-18 DIAGNOSIS — G4733 Obstructive sleep apnea (adult) (pediatric): Secondary | ICD-10-CM | POA: Diagnosis not present

## 2021-04-18 DIAGNOSIS — I77810 Thoracic aortic ectasia: Secondary | ICD-10-CM | POA: Diagnosis not present

## 2021-04-18 DIAGNOSIS — Z9989 Dependence on other enabling machines and devices: Secondary | ICD-10-CM

## 2021-04-18 DIAGNOSIS — I878 Other specified disorders of veins: Secondary | ICD-10-CM | POA: Diagnosis not present

## 2021-04-18 NOTE — Progress Notes (Signed)
Cardiology Office Note:    Date:  04/18/2021   ID:  Brian Walsh, DOB 29-Oct-1960, MRN [id number]  PCP:  Tammi Sou, MD  Cardiologist:  Jenne Campus, MD    Referring MD: Tammi Sou, MD   Chief Complaint  Patient presents with   Follow-up  Doing well  History of Present Illness:    Brian Walsh is a 60 y.o. male who was referred to [place] because of swelling of lower extremities.  There is some concern about potentially having congestive heart failure.  Overall otherwise he is doing very well.  He also does have multiple sclerosis essential hypertension obstructive sleep apnea that is managed very successfully and effectively with CPAP.  He comes today to my office to discuss results of his test echocardiogram has been performed which showed preserved left ventricle ejection fraction normal global longitudinal strain, normal diastolic function normal pulm artery pressure.  There is no explanation for his swelling based on echocardiogram which is practically normal the only abnormality to be identified is the fact that his ascending aorta is enlarged measuring 42 mm.  He also have proBNP done which was perfectly normal.  Overall he is doing very well he denies have any chest pain tightness squeezing pressure burning chest he does have some exertional shortness of breath but nothing new nothing changed from multiple sclerosis doing well.  Past Medical History:  Diagnosis Date   Allergic rhinitis    Much improved with allergy immunotherapy. Restart immunotherapy w/Dr. Whelan winter 2020 (cat/dog dander, pollens).   Asthma    no inhaler requirement since allergies under control with immunotherapy   Blood transfusion without reported diagnosis    BPH (benign prostatic hypertrophy) 2014   TURP   Chronic constipation    Chronic sinusitis    Deviated nasal septum: >90% airway obstruction on right.  Dr. Wilburn Cornelia discussed possible septoplasty and turbinate reduction with pt in  2015.   Constipation, chronic 11/24/2011   H/O blood clots    in eye    H/O nonmelanoma skin cancer    History of adenomatous polyp of colon    Most recent colonoscopy 07/2017---recall 5 yrs.   IFG (impaired fasting glucose) 2019; 2021   A1c 5.8% 2019 and 2021.   Lower extremity venous stasis    OSA on CPAP 2005   cpap nightly   Personal history of colonic polyps 03/24/2012   03/2012 - 5 mm cecal adenoma   PUD (peptic ulcer disease)    Bleeding ulcer 1978   Relapsing remitting multiple sclerosis ([place]) 1992   Remission since 1996 on no meds.  Initial presentation was gait/balance and leg weakness sx's.  Additional relapses with blurry vision and uncoordination.  Dr. Tomi Likens 04/2017 recommended re-estab baseline MRI brain and C-spine + start disease modifying therapy but pt declined.   Sleep apnea    wears cpap    Thyroid nodule 05/2012   Right lobe 82mmX14mm nodule found on screening thyroid and carotid u/s done through his employer.  Pt was euthyroid at that time.  A radioactive iodine uptake and scan was normal 06/2012.  FNA bx 04/2015 was BENIGN/CYST.    Past Surgical History:  Procedure Laterality Date   APPENDECTOMY  1986   COLONOSCOPY  2006, 2013; 07/2017   diminutive hyperplastic cecal polyp ([place]); diminutive cecal adenoma removed 2013.  Repeat 07/24/17, one polyp--recall 5 yrs.   FEMUR FRACTURE SURGERY  1978   with rod insertion.  Rod removal 1979   FNA R Thyroid nodule  04/17/15   Benign/cystic (Dr. Cruzita Lederer)   [place] Right 10/22/2015   Procedure: LAPAROSCOPIC RIGHT INGUINAL HERNIA REPAIR;  Surgeon: Mickeal Skinner, MD;  Location[facility];  Service: General;  Laterality: Right;   INSERTION OF MESH Right 10/22/2015   Procedure: INSERTION OF MESH;  Surgeon: Mickeal Skinner, MD;  [facility];  Service: General;  Laterality: Right;   TRANSURETHRAL RESECTION OF PROSTATE N/A 08/25/2012   Procedure: TRANSURETHRAL  RESECTION OF THE PROSTATE WITH GYRUS INSTRUMENTS;  Surgeon: Alexis Frock, MD;  Location: [facility];  Service: Urology;  Laterality: N/A;--patient got excellent results from procedure.    Current Medications: Current Meds  Medication Sig   albuterol (VENTOLIN HFA) 108 (90 Base) MCG/ACT inhaler Inhale 2 puffs into the lungs every 4 (four) hours as needed for shortness of breath.   aspirin EC 81 MG tablet Take 81 mg by mouth daily.   B Complex-Folic Acid (B COMPLEX-VITAMIN B12 PO) Take 1 capsule by mouth daily. Unknown strenght   Calcium-Magnesium-Zinc 167-83-8 MG TABS Take 1 tablet by mouth 2 (two) times daily after a meal.   cholecalciferol (VITAMIN D) 1000 units tablet Take 1,000 Units by mouth daily.   ePHEDrine HCl (PRIMATENE) 12.5 MG TABS Take 12.5 mg by mouth as needed (SOB, Wheezing).   EPINEPHrine 0.3 mg/0.3 mL IJ SOAJ injection INJECT INTO OUTER THIGH AS NEEDED FOR ANAPHYLAXIS (Patient taking differently: Inject 0.3 mg into the muscle as needed for anaphylaxis. INJECT INTO OUTER THIGH AS NEEDED FOR ANAPHYLAXIS)   fluorouracil (EFUDEX) 5 % cream Apply 1 application topically daily as needed (rash).   losartan (COZAAR) 25 MG tablet Take 1 tablet (25 mg total) by mouth daily.   Olopatadine HCl 0.6 % SOLN Place 2 sprays into both nostrils 2 (two) times daily.   psyllium (METAMUCIL) 58.6 % powder Take 1 packet by mouth 2 (two) times daily.    senna (SENOKOT) 8.6 MG tablet Take 2 tablets by mouth 2 (two) times daily as needed for constipation.   tadalafil (CIALIS) 10 MG tablet 1-2 tabs po qd prn (Patient taking differently: Take 10 mg by mouth daily as needed for erectile dysfunction. 1-2 tabs po qd prn)   vitamin C (ASCORBIC ACID) 500 MG tablet Take 500 mg by mouth daily.     Allergies:   Molds & smuts   Social History   Socioeconomic History   Marital status: Married    Spouse name: Not on file   Number of children: 1   Years of education: Not on file   Highest education level: Not  on file  Occupational History   Occupation: HR manager    Employer: [employer]  Tobacco Use   Smoking status: Never   Smokeless tobacco: Never  Vaping Use   Vaping Use: Never used  Substance and Sexual Activity   Alcohol use: Yes    Alcohol/week: 1.0 standard drink    Types: 1 Glasses of wine per week    Comment: occasional wine    Drug use: No   Sexual activity: Not on file  Other Topics Concern   Not on file  Social History Narrative   Married, has one 60 y/o son.   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   JD and MBA from [place].   No tobacco, 3-4 glasses of red wine per week, no drug use.   No exercise.   [place], plays tennis, works  out at gym 5 days a week,. He has a law degree-04/21/17-sjb   Right handed   2 story home   Caffeine intake yes   Social Determinants of Health   Financial Resource Strain: Not on file  Food Insecurity: Not on file  Transportation Needs: Not on file  Physical Activity: Not on file  Stress: Not on file  Social Connections: Not on file     Family History: The patient's family history includes Breast cancer in his mother; Colon polyps (age of onset: 68) in his father; Parkinsonism in his father; Stroke in his father. There is no history of Colon cancer, Esophageal cancer, Rectal cancer, or Stomach cancer. ROS:   Please see the history of present illness.    All 14 point review of systems negative except as described per history of present illness  EKGs/Labs/Other Studies Reviewed:      Recent Labs: 02/05/2021: BUN 13; Creatinine, Ser 1.00; NT-Pro BNP 61; Potassium 5.1; Sodium 142  Recent Lipid Panel    Component Value Date/Time   CHOL 161 01/17/2020 0843   TRIG 66.0 01/17/2020 0843   HDL 60.90 01/17/2020 0843   CHOLHDL 3 01/17/2020 0843   VLDL 13.2 01/17/2020 0843   LDLCALC 87 01/17/2020 0843    Physical Exam:    VS:  BP (!) 118/46 (BP Location: Right Arm, Patient  Position: Sitting)   Pulse 65   Ht 5\' 10"  (1.778 m)   Wt 229 lb (103.9 kg)   SpO2 95%   BMI 32.86 kg/m     Wt Readings from Last 3 Encounters:  04/18/21 229 lb (103.9 kg)  03/14/21 230 lb 3.2 oz (104.4 kg)  02/05/21 228 lb (103.4 kg)     GEN:  Well nourished, well developed in no acute distress HEENT: Normal NECK: No JVD; No carotid bruits LYMPHATICS: No lymphadenopathy CARDIAC: RRR, no murmurs, no rubs, no gallops RESPIRATORY:  Clear to auscultation without rales, wheezing or rhonchi  ABDOMEN: Soft, non-tender, non-distended MUSCULOSKELETAL:  No edema; No deformity  SKIN: Warm and dry LOWER EXTREMITIES: no swelling NEUROLOGIC:  Alert and oriented x 3 PSYCHIATRIC:  Normal affect   ASSESSMENT:    1. Lower extremity venous stasis   2. Essential hypertension   3. OSA on CPAP   4. 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  I talked also about potentially giving diuretics however I do not think we reached a point yet.  We did talk also about elastic stockings.  Overall he is very happy with the way that his legs are.  We did not find any cardiac explanation for this. Essential hypertension perfectly controlled with losartan.  Overall he is feeling much better I will check his Chem-7 to make sure his kidney function acceptable. Obstructive sleep apnea: Managed by CPAP Dilatation of the ascending aorta measuring 42 mm.  I will schedule him to have CT of the chest to make sure he does not have any enlargement anywhere else.  He also have abdominal ultrasound to look at abdominal aorta.   Medication Adjustments/Labs and Tests Ordered: Current medicines are reviewed at length with the patient today.  Concerns regarding medicines are outlined above.  No orders of the defined types were placed in this encounter.  Medication changes: No orders of the defined types were placed in this encounter.   Signed, Park Liter, MD, [facility] 04/18/2021 8:28 AM    [place]

## 2021-04-18 NOTE — Patient Instructions (Signed)
Medication Instructions:  Your physician recommends that you continue on your current medications as directed. Please refer to the Current Medication list given to you today.  *If you need a refill on your cardiac medications before your next appointment, please call your pharmacy*   Lab Work: Your physician recommends that you return for lab work today: bmp   If you have labs (blood work) drawn today and your tests are completely normal, you will receive your results only by: [place] (if you have MyChart) OR A paper copy in the mail If you have any lab test that is abnormal or we need to change your treatment, we will call you to review the results.   Testing/Procedures: Your physician has requested that you have an abdominal aorta duplex. During this test, an ultrasound is used to evaluate the aorta. Allow 30 minutes for this exam. Do not eat after midnight the day before and avoid carbonated beverages  Non-Cardiac CT scanning, (CAT scanning), is a noninvasive, special x-ray that produces cross-sectional images of the body using x-rays and a computer. CT scans help physicians diagnose and treat medical conditions. For some CT exams, a contrast material is used to enhance visibility in the area of the body being studied. CT scans provide greater clarity and reveal more details than regular x-ray exams.    Follow-Up: At [facility], you and your health needs are our priority.  As part of our continuing mission to provide you with exceptional heart care, we have created designated Provider Care Teams.  These Care Teams include your primary Cardiologist (physician) and Advanced Practice Providers (APPs -  Physician Assistants and Nurse Practitioners) who all work together to provide you with the care you need, when you need it.  We recommend signing up for the patient portal called "MyChart".  Sign up information is provided on this After Visit Summary.  MyChart is used to connect  with patients for Virtual Visits (Telemedicine).  Patients are able to view lab/test results, encounter notes, upcoming appointments, etc.  Non-urgent messages can be sent to your provider as well.   To learn more about what you can do with MyChart, go to [email or web address].    Your next appointment:   1 year(s)  The format for your next appointment:   In Person  Provider:   Jenne Campus, MD   Other Instructions

## 2021-04-19 ENCOUNTER — Telehealth: Payer: Self-pay

## 2021-04-19 LAB — BASIC METABOLIC PANEL
BUN/Creatinine Ratio: 12 (ref 10–24)
BUN: 12 mg/dL (ref 8–27)
CO2: 22 mmol/L (ref 20–29)
Calcium: 9.5 mg/dL (ref 8.6–10.2)
Chloride: 106 mmol/L (ref 96–106)
Creatinine, Ser: 1 mg/dL (ref 0.76–1.27)
Glucose: 109 mg/dL — ABNORMAL HIGH (ref 70–99)
Potassium: 4.5 mmol/L (ref 3.5–5.2)
Sodium: 140 mmol/L (ref 134–144)
eGFR: 86 mL/min/{1.73_m2} (ref >59)

## 2021-04-19 NOTE — Telephone Encounter (Signed)
Left message on patients voicemail to please return our call.   

## 2021-04-19 NOTE — Telephone Encounter (Signed)
Patient returning call.

## 2021-04-19 NOTE — Telephone Encounter (Signed)
-----   Message from Park Liter, MD sent at 04/19/2021  8:59 AM EDT ----- Labs are looking good, kidney function is normal

## 2021-04-19 NOTE — Telephone Encounter (Signed)
Spoke with patient regarding results and recommendation.  Patient verbalizes understanding and is agreeable to plan of care. Advised patient to call back with any issues or concerns.  

## 2021-04-24 DIAGNOSIS — J3089 Other allergic rhinitis: Secondary | ICD-10-CM | POA: Diagnosis not present

## 2021-04-24 DIAGNOSIS — J3081 Allergic rhinitis due to animal (cat) (dog) hair and dander: Secondary | ICD-10-CM | POA: Diagnosis not present

## 2021-04-24 DIAGNOSIS — J301 Allergic rhinitis due to pollen: Secondary | ICD-10-CM | POA: Diagnosis not present

## 2021-04-25 ENCOUNTER — Other Ambulatory Visit: Payer: Self-pay

## 2021-04-26 ENCOUNTER — Ambulatory Visit (INDEPENDENT_AMBULATORY_CARE_PROVIDER_SITE_OTHER): Payer: Managed Care, Other (non HMO) | Admitting: Family Medicine

## 2021-04-26 ENCOUNTER — Encounter: Payer: Self-pay | Admitting: Family Medicine

## 2021-04-26 VITALS — BP 110/60 | HR 63 | Temp 97.7°F | Ht 70.0 in | Wt 227.6 lb

## 2021-04-26 DIAGNOSIS — I872 Venous insufficiency (chronic) (peripheral): Secondary | ICD-10-CM | POA: Diagnosis not present

## 2021-04-26 DIAGNOSIS — R7301 Impaired fasting glucose: Secondary | ICD-10-CM | POA: Diagnosis not present

## 2021-04-26 DIAGNOSIS — Z125 Encounter for screening for malignant neoplasm of prostate: Secondary | ICD-10-CM

## 2021-04-26 DIAGNOSIS — Z23 Encounter for immunization: Secondary | ICD-10-CM

## 2021-04-26 DIAGNOSIS — I10 Essential (primary) hypertension: Secondary | ICD-10-CM | POA: Diagnosis not present

## 2021-04-26 DIAGNOSIS — Z0000 Encounter for general adult medical examination without abnormal findings: Secondary | ICD-10-CM

## 2021-04-26 DIAGNOSIS — G35 Multiple sclerosis: Secondary | ICD-10-CM | POA: Diagnosis not present

## 2021-04-26 LAB — CBC WITH DIFFERENTIAL/PLATELET
Basophils Absolute: 0 10*3/uL (ref 0.0–0.1)
Basophils Relative: 0.5 % (ref 0.0–3.0)
Eosinophils Absolute: 0.1 10*3/uL (ref 0.0–0.7)
Eosinophils Relative: 1.8 % (ref 0.0–5.0)
HCT: 43.3 % (ref 39.0–52.0)
Hemoglobin: 14.3 g/dL (ref 13.0–17.0)
Lymphocytes Relative: 29.8 % (ref 12.0–46.0)
Lymphs Abs: 1.2 10*3/uL (ref 0.7–4.0)
MCHC: 33.1 g/dL (ref 30.0–36.0)
MCV: 94.2 fl (ref 78.0–100.0)
Monocytes Absolute: 0.4 10*3/uL (ref 0.1–1.0)
Monocytes Relative: 9.9 % (ref 3.0–12.0)
Neutro Abs: 2.3 10*3/uL (ref 1.4–7.7)
Neutrophils Relative %: 58 % (ref 43.0–77.0)
Platelets: 240 10*3/uL (ref 150.0–400.0)
RBC: 4.6 Mil/uL (ref 4.22–5.81)
RDW: 13.8 % (ref 11.5–15.5)
WBC: 4 10*3/uL (ref 4.0–10.5)

## 2021-04-26 LAB — COMPREHENSIVE METABOLIC PANEL
ALT: 23 U/L (ref 0–53)
AST: 16 U/L (ref 0–37)
Albumin: 4.3 g/dL (ref 3.5–5.2)
Alkaline Phosphatase: 66 U/L (ref 39–117)
BUN: 12 mg/dL (ref 6–23)
CO2: 29 mEq/L (ref 19–32)
Calcium: 9.7 mg/dL (ref 8.4–10.5)
Chloride: 102 mEq/L (ref 96–112)
Creatinine, Ser: 1.03 mg/dL (ref 0.40–1.50)
GFR: 78.98 mL/min (ref >60.00)
Glucose, Bld: 107 mg/dL — ABNORMAL HIGH (ref 70–99)
Potassium: 4.5 mEq/L (ref 3.5–5.1)
Sodium: 138 mEq/L (ref 135–145)
Total Bilirubin: 0.6 mg/dL (ref 0.2–1.2)
Total Protein: 7 g/dL (ref 6.0–8.3)

## 2021-04-26 LAB — PSA: PSA: 0.66 ng/mL (ref 0.10–4.00)

## 2021-04-26 LAB — LIPID PANEL
Cholesterol: 163 mg/dL (ref 0–200)
HDL: 58.3 mg/dL (ref >39.00)
LDL Cholesterol: 84 mg/dL (ref 0–99)
NonHDL: 104.68
Total CHOL/HDL Ratio: 3
Triglycerides: 104 mg/dL (ref 0.0–149.0)
VLDL: 20.8 mg/dL (ref 0.0–40.0)

## 2021-04-26 LAB — TSH: TSH: 1.71 u[IU]/mL (ref 0.35–5.50)

## 2021-04-26 LAB — HEMOGLOBIN A1C: Hgb A1c MFr Bld: 5.9 % (ref 4.6–6.5)

## 2021-04-26 MED ORDER — ALBUTEROL SULFATE HFA 108 (90 BASE) MCG/ACT IN AERS: 2.0000 | INHALATION_SPRAY | RESPIRATORY_TRACT | 1 refills | Status: DC | PRN

## 2021-04-26 NOTE — Patient Instructions (Signed)
Health Maintenance, Male Adopting a healthy lifestyle and getting preventive care are important in promoting health and wellness. Ask your health care provider about: The right schedule for you to have regular tests and exams. Things you can do on your own to prevent diseases and keep yourself healthy. What should I know about diet, weight, and exercise? Eat a healthy diet  Eat a diet that includes plenty of vegetables, fruits, low-fat dairy products, and lean protein. Do not eat a lot of foods that are high in solid fats, added sugars, or sodium. Maintain a healthy weight Body mass index (BMI) is a measurement that can be used to identify possible weight problems. It estimates body fat based on height and weight. Your health care provider can help determine your BMI and help you achieve or maintain a healthy weight. Get regular exercise Get regular exercise. This is one of the most important things you can do for your health. Most adults should: Exercise for at least 150 minutes each week. The exercise should increase your heart rate and make you sweat (moderate-intensity exercise). Do strengthening exercises at least twice a week. This is in addition to the moderate-intensity exercise. Spend less time sitting. Even light physical activity can be beneficial. Watch cholesterol and blood lipids Have your blood tested for lipids and cholesterol at 60 years of age, then have this test every 5 years. You may need to have your cholesterol levels checked more often if: Your lipid or cholesterol levels are high. You are older than 60 years of age. You are at high risk for heart disease. What should I know about cancer screening? Many types of cancers can be detected early and may often be prevented. Depending on your health history and family history, you may need to have cancer screening at various ages. This may include screening for: Colorectal cancer. Prostate cancer. Skin cancer. Lung  cancer. What should I know about heart disease, diabetes, and high blood pressure? Blood pressure and heart disease High blood pressure causes heart disease and increases the risk of stroke. This is more likely to develop in people who have high blood pressure readings, are of African descent, or are overweight. Talk with your health care provider about your target blood pressure readings. Have your blood pressure checked: Every 3-5 years if you are 60-39 years of age. Every year if you are 40 years old or older. If you are between the ages of 65 and 75 and are a current or former smoker, ask your health care provider if you should have a one-time screening for abdominal aortic aneurysm (AAA). Diabetes Have regular diabetes screenings. This checks your fasting blood sugar level. Have the screening done: Once every three years after age 60 if you are at a normal weight and have a low risk for diabetes. More often and at a younger age if you are overweight or have a high risk for diabetes. What should I know about preventing infection? Hepatitis B If you have a higher risk for hepatitis B, you should be screened for this virus. Talk with your health care provider to find out if you are at risk for hepatitis B infection. Hepatitis C Blood testing is recommended for: Everyone born from 1945 through 1965. Anyone with known risk factors for hepatitis C. Sexually transmitted infections (STIs) You should be screened each year for STIs, including gonorrhea and chlamydia, if: You are sexually active and are younger than 60 years of age. You are older than 60 years   of age and your health care provider tells you that you are at risk for this type of infection. Your sexual activity has changed since you were last screened, and you are at increased risk for chlamydia or gonorrhea. Ask your health care provider if you are at risk. Ask your health care provider about whether you are at high risk for HIV.  Your health care provider may recommend a prescription medicine to help prevent HIV infection. If you choose to take medicine to prevent HIV, you should first get tested for HIV. You should then be tested every 3 months for as long as you are taking the medicine. Follow these instructions at home: Lifestyle Do not use any products that contain nicotine or tobacco, such as cigarettes, e-cigarettes, and chewing tobacco. If you need help quitting, ask your health care provider. Do not use street drugs. Do not share needles. Ask your health care provider for help if you need support or information about quitting drugs. Alcohol use Do not drink alcohol if your health care provider tells you not to drink. If you drink alcohol: Limit how much you have to 0-2 drinks a day. Be aware of how much alcohol is in your drink. In the [place] one drink equals one 12 oz bottle of beer (355 mL), one 5 oz glass of wine (148 mL), or one 1 oz glass of hard liquor (44 mL). General instructions Schedule regular health, dental, and eye exams. Stay current with your vaccines. Tell your health care provider if: You often feel depressed. You have ever been abused or do not feel safe at home. Summary Adopting a healthy lifestyle and getting preventive care are important in promoting health and wellness. Follow your health care provider's instructions about healthy diet, exercising, and getting tested or screened for diseases. Follow your health care provider's instructions on monitoring your cholesterol and blood pressure. This information is not intended to replace advice given to you by your health care provider. Make sure you discuss any questions you have with your health care provider. Document Revised: 09/07/2020 Document Reviewed: 06/23/2018 Elsevier Patient Education  2022 Elsevier Inc.  

## 2021-04-26 NOTE — Progress Notes (Signed)
Office Note 04/26/2021  CC:  Chief Complaint  Patient presents with   Annual Exam    Pt is fasting   HPI:  Patient is a 60 y.o. male who is here for annual health maintenance exam and f/u HTN. Feeling well.  Home bp monitoring: has cuff but not checking b/c was always normal at home. Losartan 25 qd.  Reports 4 to 5-year history of gradually increasing lower extremity swelling.  No pain.  Right is just a little worse than the left.  He underwent evaluation with Dr. Agustin Cree in cardiology and echocardiogram showed normal function and normal valves.  Ascending aorta just a little large at 42 mm.  He has a CT scan of the chest scheduled to further evaluate this.  Past Medical History:  Diagnosis Date   Allergic rhinitis    Much improved with allergy immunotherapy. Restart immunotherapy w/Dr. Whelan winter 2020 (cat/dog dander, pollens).   Asthma    no inhaler requirement since allergies under control with immunotherapy   Blood transfusion without reported diagnosis    BPH (benign prostatic hypertrophy) 2014   TURP   Chronic constipation    Chronic sinusitis    Deviated nasal septum: >90% airway obstruction on right.  Dr. Wilburn Cornelia discussed possible septoplasty and turbinate reduction with pt in 2015.   Constipation, chronic 11/24/2011   H/O blood clots    in eye    H/O nonmelanoma skin cancer    History of adenomatous polyp of colon    Most recent colonoscopy 07/2017---recall 5 yrs.   IFG (impaired fasting glucose) 2019; 2021   A1c 5.8% 2019 and 2021.   Lower extremity venous stasis    OSA on CPAP 2005   cpap nightly   Personal history of colonic polyps 03/24/2012   03/2012 - 5 mm cecal adenoma   PUD (peptic ulcer disease)    Bleeding ulcer 1978   Relapsing remitting multiple sclerosis ([place]) 1992   Remission since 1996 on no meds.  Initial presentation was gait/balance and leg weakness sx's.  Additional relapses with blurry vision and uncoordination.  Dr. Tomi Likens 04/2017  recommended re-estab baseline MRI brain and C-spine + start disease modifying therapy but pt declined.   Sleep apnea    wears cpap    Thyroid nodule 05/2012   Right lobe 45mmX14mm nodule found on screening thyroid and carotid u/s done through his employer.  Pt was euthyroid at that time.  A radioactive iodine uptake and scan was normal 06/2012.  FNA bx 04/2015 was BENIGN/CYST.    Past Surgical History:  Procedure Laterality Date   APPENDECTOMY  1986   COLONOSCOPY  2006, 2013; 07/2017   diminutive hyperplastic cecal polyp ([place]); diminutive cecal adenoma removed 2013.  Repeat 07/24/17, one polyp--recall 5 yrs.   FEMUR FRACTURE SURGERY  1978   with rod insertion.  Rod removal 1979   FNA R Thyroid nodule  04/17/15   Benign/cystic (Dr. Cruzita Lederer)   [place] Right 10/22/2015   Procedure: LAPAROSCOPIC RIGHT INGUINAL HERNIA REPAIR;  Surgeon: Mickeal Skinner, MD;  Location: [facility];  Service: General;  Laterality: Right;   INSERTION OF MESH Right 10/22/2015   Procedure: INSERTION OF MESH;  Surgeon: Mickeal Skinner, MD;  Location: [facility];  Service: General;  Laterality: Right;   TRANSURETHRAL RESECTION OF PROSTATE N/A 08/25/2012   Procedure: TRANSURETHRAL RESECTION OF THE PROSTATE WITH GYRUS INSTRUMENTS;  Surgeon: Alexis Frock, MD;  Location: [facility];  Service: Urology;  Laterality: N/A;--patient got  excellent results from procedure.    Family History  Problem Relation Age of Onset   Parkinsonism Father    Stroke Father    Colon polyps Father 24   Breast cancer Mother    Colon cancer Neg Hx    Esophageal cancer Neg Hx    Rectal cancer Neg Hx    Stomach cancer Neg Hx     Social History   Socioeconomic History   Marital status: Married    Spouse name: Not on file   Number of children: 1   Years of education: Not on file   Highest education level: Not on file  Occupational History   Occupation: HR manager    Employer: [employer]  Tobacco Use   Smoking status: Never   Smokeless tobacco: Never  Vaping Use   Vaping Use: Never used  Substance and Sexual Activity   Alcohol use: Yes    Alcohol/week: 1.0 standard drink    Types: 1 Glasses of wine per week    Comment: occasional wine    Drug use: No   Sexual activity: Not on file  Other Topics Concern   Not on file  Social History Narrative   Married, has one 78 y/o son.   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   JD and MBA from [employer].   No tobacco, 3-4 glasses of red wine per week, no drug use.   No exercise.   [place], plays tennis, works out at gym 5 days a week,. He has a law degree-04/21/17-sjb   Right handed   2 story home   Caffeine intake yes   Social Determinants of Health   Financial Resource Strain: Not on file  Food Insecurity: Not on file  Transportation Needs: Not on file  Physical Activity: Not on file  Stress: Not on file  Social Connections: Not on file  Intimate Partner Violence: Not on file    Outpatient Medications Prior to Visit  Medication Sig Dispense Refill   aspirin EC 81 MG tablet Take 81 mg by mouth daily.     B Complex-Folic Acid (B COMPLEX-VITAMIN B12 PO) Take 1 capsule by mouth daily. Unknown strenght     Calcium-Magnesium-Zinc 167-83-8 MG TABS Take 1 tablet by mouth 2 (two) times daily after a meal.     cholecalciferol (VITAMIN D) 1000 units tablet Take 1,000 Units by mouth daily.     ePHEDrine HCl (PRIMATENE) 12.5 MG TABS Take 12.5 mg by mouth as needed (SOB, Wheezing).     EPINEPHrine 0.3 mg/0.3 mL IJ SOAJ injection INJECT INTO OUTER THIGH AS NEEDED FOR ANAPHYLAXIS (Patient taking differently: Inject 0.3 mg into the muscle as needed for anaphylaxis. INJECT INTO OUTER THIGH AS NEEDED FOR ANAPHYLAXIS) 1 each 2   fluorouracil (EFUDEX) 5 % cream Apply 1 application topically daily as needed (rash).  0   losartan (COZAAR) 25 MG tablet Take 1 tablet (25 mg total)  by mouth daily. 90 tablet 3   Olopatadine HCl 0.6 % SOLN Place 2 sprays into both nostrils 2 (two) times daily.     psyllium (METAMUCIL) 58.6 % powder Take 1 packet by mouth 2 (two) times daily.      senna (SENOKOT) 8.6 MG tablet Take 2 tablets by mouth 2 (two) times daily as needed for constipation.     tadalafil (CIALIS) 10 MG tablet 1-2 tabs po qd prn (Patient taking differently: Take 10 mg by mouth daily as  needed for erectile dysfunction. 1-2 tabs po qd prn) 10 tablet 6   vitamin C (ASCORBIC ACID) 500 MG tablet Take 500 mg by mouth daily.     albuterol (VENTOLIN HFA) 108 (90 Base) MCG/ACT inhaler Inhale 2 puffs into the lungs every 4 (four) hours as needed for shortness of breath. 18 g 0   No facility-administered medications prior to visit.    Allergies  Allergen Reactions   Molds & Smuts Other (See Comments)    ROS Review of Systems  Constitutional:  Negative for appetite change, chills, fatigue and fever.  HENT:  Negative for congestion, dental problem, ear pain and sore throat.   Eyes:  Negative for discharge, redness and visual disturbance.  Respiratory:  Negative for cough, chest tightness, shortness of breath and wheezing.   Cardiovascular:  Positive for leg swelling (bilat). Negative for chest pain and palpitations.  Gastrointestinal:  Negative for abdominal pain, blood in stool, diarrhea, nausea and vomiting.  Genitourinary:  Negative for difficulty urinating, dysuria, flank pain, frequency, hematuria and urgency.  Musculoskeletal:  Negative for arthralgias, back pain, joint swelling, myalgias and neck stiffness.  Skin:  Negative for pallor and rash.  Neurological:  Negative for dizziness, speech difficulty, weakness and headaches.  Hematological:  Negative for adenopathy. Does not bruise/bleed easily.  Psychiatric/Behavioral:  Negative for confusion and sleep disturbance. The patient is not nervous/anxious.    PE; Vitals with BMI 04/26/2021 04/18/2021 03/14/2021  Height  5\' 10"  5\' 10"  5\' 9"   Weight 227 lbs 10 oz 229 lbs 230 lbs 3 oz  BMI 32.66 35.00 93.81  Systolic 829 937 169  Diastolic 60 46 85  Pulse 63 65 62     Gen: Alert, well appearing.  Patient is oriented to person, place, time, and situation. AFFECT: pleasant, lucid thought and speech. ENT: Ears: EACs clear, normal epithelium.  TMs with good light reflex and landmarks bilaterally.  Eyes: no injection, icteris, swelling, or exudate.  EOMI, PERRLA. Nose: no drainage or turbinate edema/swelling.  No injection or focal lesion.  Mouth: lips without lesion/swelling.  Oral mucosa pink and moist.  Dentition intact and without obvious caries or gingival swelling.  Oropharynx without erythema, exudate, or swelling.  Neck: supple/nontender.  No LAD, mass, or TM.  Carotid pulses 2+ bilaterally, without bruits. CV: RRR, no m/r/g.   LUNGS: CTA bilat, nonlabored resps, good aeration in all lung fields. ABD: soft, NT, ND, BS normal.  No hepatospenomegaly or mass.  No bruits. EXT: no clubbing or cyanosis.  He has 2-3 + pitting edema bilat LLs/ankles.  Just a bit asymmetric with R>L. Musculoskeletal: no joint swelling, erythema, warmth, or tenderness.  ROM of all joints intact. Skin - no sores or suspicious lesions or rashes or color changes  Pertinent labs:  Lab Results  Component Value Date   TSH 1.35 01/17/2020   Lab Results  Component Value Date   WBC 5.8 01/17/2020   HGB 14.7 01/17/2020   HCT 43.9 01/17/2020   MCV 94.5 01/17/2020   PLT 255.0 01/17/2020   Lab Results  Component Value Date   CREATININE 1.00 04/18/2021   BUN 12 04/18/2021   NA 140 04/18/2021   K 4.5 04/18/2021   CL 106 04/18/2021   CO2 22 04/18/2021   Lab Results  Component Value Date   ALT 18 01/17/2020   AST 20 01/17/2020   ALKPHOS 65 01/17/2020   BILITOT 0.6 01/17/2020   Lab Results  Component Value Date   CHOL 161 01/17/2020  Lab Results  Component Value Date   HDL 60.90 01/17/2020   Lab Results  Component  Value Date   LDLCALC 87 01/17/2020   Lab Results  Component Value Date   TRIG 66.0 01/17/2020   Lab Results  Component Value Date   CHOLHDL 3 01/17/2020   Lab Results  Component Value Date   PSA 0.60 01/17/2020   PSA 0.65 01/08/2018   PSA 0.65 01/02/2017   Lab Results  Component Value Date   HGBA1C 5.8 01/17/2020   ASSESSMENT AND PLAN:   1) HTN: well controlled on losartan 25 qd. Lytes/cr today.  2) LE edema: chronic venous insufficiency. Discussed dx with pt today. Encouraged low Na diet, elevation prn, exercise as much as possible. Recent echo reassuringly normal cardiac function.  3) Health maintenance exam: Reviewed age and gender appropriate health maintenance issues (prudent diet, regular exercise, health risks of tobacco and excessive alcohol, use of seatbelts, fire alarms in home, use of sunscreen).  Also reviewed age and gender appropriate health screening as well as vaccine recommendations. Vaccines: Prevnar 20->given today.  Flu->given. Labs: fasting HP + Hba1c (IFG) and PSA. Prostate ca screening: PSA Colon ca screening: recall 07/2022.  An After Visit Summary was printed and given to the patient.  FOLLOW UP:  Return in about 1 year (around 04/26/2022) for annual CPE (fasting).  Signed:  Crissie Sickles, MD           04/26/2021

## 2021-04-29 ENCOUNTER — Other Ambulatory Visit: Payer: Self-pay

## 2021-04-29 ENCOUNTER — Ambulatory Visit (HOSPITAL_BASED_OUTPATIENT_CLINIC_OR_DEPARTMENT_OTHER)
Admission: RE | Admit: 2021-04-29 | Discharge: 2021-04-29 | Disposition: A | Discharge status: Home / Self Care | Payer: Managed Care, Other (non HMO) | Source: Ambulatory Visit | Attending: Cardiology | Admitting: Cardiology

## 2021-04-29 DIAGNOSIS — I77810 Thoracic aortic ectasia: Secondary | ICD-10-CM | POA: Diagnosis not present

## 2021-04-29 DIAGNOSIS — G4733 Obstructive sleep apnea (adult) (pediatric): Secondary | ICD-10-CM | POA: Diagnosis not present

## 2021-04-29 DIAGNOSIS — I878 Other specified disorders of veins: Secondary | ICD-10-CM

## 2021-04-29 DIAGNOSIS — I10 Essential (primary) hypertension: Secondary | ICD-10-CM | POA: Diagnosis not present

## 2021-04-29 DIAGNOSIS — R911 Solitary pulmonary nodule: Secondary | ICD-10-CM | POA: Diagnosis not present

## 2021-04-29 DIAGNOSIS — Z9989 Dependence on other enabling machines and devices: Secondary | ICD-10-CM | POA: Diagnosis not present

## 2021-04-29 DIAGNOSIS — I700 Atherosclerosis of aorta: Secondary | ICD-10-CM | POA: Diagnosis not present

## 2021-05-01 DIAGNOSIS — J3081 Allergic rhinitis due to animal (cat) (dog) hair and dander: Secondary | ICD-10-CM | POA: Diagnosis not present

## 2021-05-01 DIAGNOSIS — J3089 Other allergic rhinitis: Secondary | ICD-10-CM | POA: Diagnosis not present

## 2021-05-01 DIAGNOSIS — J301 Allergic rhinitis due to pollen: Secondary | ICD-10-CM | POA: Diagnosis not present

## 2021-05-02 DIAGNOSIS — J3089 Other allergic rhinitis: Secondary | ICD-10-CM | POA: Diagnosis not present

## 2021-05-03 ENCOUNTER — Telehealth: Payer: Self-pay | Admitting: Cardiology

## 2021-05-03 ENCOUNTER — Encounter: Payer: Self-pay | Admitting: Family Medicine

## 2021-05-03 ENCOUNTER — Other Ambulatory Visit (HOSPITAL_BASED_OUTPATIENT_CLINIC_OR_DEPARTMENT_OTHER): Payer: BC Managed Care – PPO

## 2021-05-03 NOTE — Telephone Encounter (Signed)
Patient informed of results.  

## 2021-05-03 NOTE — Telephone Encounter (Signed)
Pt is returning call for results 

## 2021-05-06 ENCOUNTER — Telehealth: Payer: Self-pay

## 2021-05-06 ENCOUNTER — Other Ambulatory Visit: Payer: Self-pay | Admitting: Family Medicine

## 2021-05-06 MED ORDER — AMOXICILLIN-POT CLAVULANATE 875-125 MG PO TABS: 1.0000 | ORAL_TABLET | Freq: Two times a day (BID) | ORAL | 0 refills | Status: DC

## 2021-05-06 NOTE — Telephone Encounter (Signed)
FYI--to keep you in the loop.  I called pt and discussed this.  He does have cough--subacute. Augmentin x 10d eRx'd. He'll arrange f/u in 3 mo and we'll see how is doing clinically and order repeat chest ct at that time.

## 2021-05-06 NOTE — Telephone Encounter (Signed)
Pt's wife called stating that  pt needs to discuss CT scan.  Park Liter, MD  05/02/2021  8:56 AM EDT     "CT of the chest showed enlargement of the ascending aorta 41 mm.  Everything else looks normal in terms of aorta.  There is some change in his right lower lobe please forward her a copy of the CT to his primary care physician for management.  It could be either atelectasis or mucous plugging or aspiration    Pt was told that he may need some Abx and to contact PCP. Pt is scheduled for 05/09/21 if appt is not needed please contact wife (DPR). Pt last seen in office 04/26/21.

## 2021-05-06 NOTE — Telephone Encounter (Signed)
Noted  

## 2021-05-08 DIAGNOSIS — J301 Allergic rhinitis due to pollen: Secondary | ICD-10-CM | POA: Diagnosis not present

## 2021-05-08 DIAGNOSIS — J3089 Other allergic rhinitis: Secondary | ICD-10-CM | POA: Diagnosis not present

## 2021-05-08 DIAGNOSIS — J3081 Allergic rhinitis due to animal (cat) (dog) hair and dander: Secondary | ICD-10-CM | POA: Diagnosis not present

## 2021-05-09 ENCOUNTER — Ambulatory Visit: Payer: BC Managed Care – PPO | Admitting: Family Medicine

## 2021-05-17 ENCOUNTER — Ambulatory Visit (HOSPITAL_BASED_OUTPATIENT_CLINIC_OR_DEPARTMENT_OTHER): Payer: BC Managed Care – PPO

## 2021-05-22 ENCOUNTER — Other Ambulatory Visit: Payer: Self-pay | Admitting: Family Medicine

## 2021-05-22 MED ORDER — AMOXICILLIN-POT CLAVULANATE 875-125 MG PO TABS: 1.0000 | ORAL_TABLET | Freq: Two times a day (BID) | ORAL | 0 refills | Status: DC

## 2021-05-22 NOTE — Telephone Encounter (Signed)
Pt is requesting refill, please see pt message below :Dr. Ernestine Conrad  The cough that I had at my last visit is much improved, but not gone.  I am wondering whether I can go through another course of teh Ntibiotic to see if we can't knock it out given the issue that showed up on my CT scan.  Please call if you have questions. [phone].  Thanks.Marland KitchenMarland KitchenMarland KitchenLaurey Arrow

## 2021-05-24 ENCOUNTER — Other Ambulatory Visit: Payer: Self-pay

## 2021-05-24 ENCOUNTER — Ambulatory Visit (HOSPITAL_COMMUNITY)
Admission: RE | Admit: 2021-05-24 | Discharge: 2021-05-24 | Disposition: A | Discharge status: Home / Self Care | Payer: Managed Care, Other (non HMO) | Source: Ambulatory Visit | Attending: Cardiology | Admitting: Cardiology

## 2021-05-24 DIAGNOSIS — G4733 Obstructive sleep apnea (adult) (pediatric): Secondary | ICD-10-CM | POA: Diagnosis present

## 2021-05-24 DIAGNOSIS — I878 Other specified disorders of veins: Secondary | ICD-10-CM | POA: Diagnosis present

## 2021-05-24 DIAGNOSIS — I77810 Thoracic aortic ectasia: Secondary | ICD-10-CM

## 2021-05-24 DIAGNOSIS — I10 Essential (primary) hypertension: Secondary | ICD-10-CM | POA: Diagnosis present

## 2021-05-24 DIAGNOSIS — Z9989 Dependence on other enabling machines and devices: Secondary | ICD-10-CM | POA: Insufficient documentation

## 2021-07-30 ENCOUNTER — Other Ambulatory Visit: Payer: Self-pay | Admitting: Family Medicine

## 2021-07-31 ENCOUNTER — Other Ambulatory Visit: Payer: Self-pay

## 2021-07-31 ENCOUNTER — Encounter: Payer: Self-pay | Admitting: Family Medicine

## 2021-07-31 ENCOUNTER — Ambulatory Visit: Payer: Managed Care, Other (non HMO) | Admitting: Family Medicine

## 2021-07-31 VITALS — BP 130/84 | HR 62 | Temp 97.8°F | Ht 70.0 in | Wt 229.6 lb

## 2021-07-31 DIAGNOSIS — I10 Essential (primary) hypertension: Secondary | ICD-10-CM

## 2021-07-31 DIAGNOSIS — R911 Solitary pulmonary nodule: Secondary | ICD-10-CM | POA: Diagnosis not present

## 2021-07-31 DIAGNOSIS — R9389 Abnormal findings on diagnostic imaging of other specified body structures: Secondary | ICD-10-CM

## 2021-07-31 LAB — BASIC METABOLIC PANEL
BUN: 14 mg/dL (ref 6–23)
CO2: 28 mEq/L (ref 19–32)
Calcium: 9.7 mg/dL (ref 8.4–10.5)
Chloride: 104 mEq/L (ref 96–112)
Creatinine, Ser: 1.06 mg/dL (ref 0.40–1.50)
GFR: 76.16 mL/min (ref >60.00)
Glucose, Bld: 104 mg/dL — ABNORMAL HIGH (ref 70–99)
Potassium: 4.4 mEq/L (ref 3.5–5.1)
Sodium: 141 mEq/L (ref 135–145)

## 2021-07-31 NOTE — Progress Notes (Signed)
OFFICE VISIT  07/31/2021  CC:  Chief Complaint  Patient presents with   Follow-up    RCI; fasting   HPI:    Patient is a 61 y.o. male who presents for 3 mo f/u HTN and abnormal chest CT 04/29/21. A/P as of last visit: "1) HTN: well controlled on losartan 25 qd. Lytes/cr today.   2) LE edema: chronic venous insufficiency. Discussed dx with pt today. Encouraged low Na diet, elevation prn, exercise as much as possible. Recent echo reassuringly normal cardiac function.   3) Health maintenance exam: Reviewed age and gender appropriate health maintenance issues (prudent diet, regular exercise, health risks of tobacco and excessive alcohol, use of seatbelts, fire alarms in home, use of sunscreen).  Also reviewed age and gender appropriate health screening as well as vaccine recommendations. Vaccines: Prevnar 20->given today.  Flu->given. Labs: fasting HP + Hba1c (IFG) and PSA. Prostate ca screening: PSA Colon ca screening: recall 07/2022."  INTERIM HX: Brian Walsh feels good. He does have some mild asthma and he said his allergist recently placed him on a maintenance inhaler but he does not recall the name. Their records are not in our EMR.  We discussed his abnormal CT chest again today, plan for repeat to follow this up. Brian Walsh has never been a smoker.  He denies cough, shortness of breath, chest pain, dizziness, abnormal weight loss, or hemoptysis.  Past Medical History:  Diagnosis Date   Allergic rhinitis    Much improved with allergy immunotherapy. Restart immunotherapy w/Dr. Whelan winter 2020 (cat/dog dander, pollens).   Asthma    well controlled since allergies under control with immunotherapy, has albut   Blood transfusion without reported diagnosis    BPH (benign prostatic hypertrophy) 2014   TURP   Chronic constipation    Chronic sinusitis    Deviated nasal septum: >90% airway obstruction on right.  Dr. Wilburn Cornelia discussed possible septoplasty and turbinate reduction with  pt in 2015.   Constipation, chronic 11/24/2011   H/O blood clots    in eye    H/O nonmelanoma skin cancer    History of adenomatous polyp of colon    Most recent colonoscopy 07/2017---recall 5 yrs.   IFG (impaired fasting glucose) 2019; 2021   A1c 5.8% 2019 and 2021.   Lower extremity venous stasis    OSA on CPAP 2005   cpap nightly   Personal history of colonic polyps 03/24/2012   03/2012 - 5 mm cecal adenoma   PUD (peptic ulcer disease)    Bleeding ulcer 1978   Relapsing remitting multiple sclerosis ([place]) 1992   Remission since 1996 on no meds.  Initial presentation was gait/balance and leg weakness sx's.  Additional relapses with blurry vision and uncoordination.  Dr. Tomi Likens 04/2017 recommended re-estab baseline MRI brain and C-spine + start disease modifying therapy but pt declined.   Thyroid nodule 05/2012   Right lobe 94mX14mm nodule found on screening thyroid and carotid u/s done through his employer.  Pt was euthyroid at that time.  A radioactive iodine uptake and scan was normal 06/2012.  FNA bx 04/2015 was BENIGN/CYST.    Past Surgical History:  Procedure Laterality Date   APPENDECTOMY  1986   COLONOSCOPY  2006, 2013; 07/2017   diminutive hyperplastic cecal polyp ([place]); diminutive cecal adenoma removed 2013.  Repeat 07/24/17, one polyp--recall 5 yrs.   FEMUR FRACTURE SURGERY  1978   with rod insertion.  Rod removal 1979   FNA R Thyroid nodule  04/17/2015   Benign/cystic (Dr. GCruzita Lederer  INGUINAL HERNIA REPAIR Right 10/22/2015   Procedure: LAPAROSCOPIC RIGHT INGUINAL HERNIA REPAIR;  Surgeon: Arta Bruce Kinsinger, MD;  Location: [facility];  Service: General;  Laterality: Right;   INSERTION OF MESH Right 10/22/2015   Procedure: INSERTION OF MESH;  Surgeon: Mickeal Skinner, MD;  Location: [place] [place];  Service: General;  Laterality: Right;   TRANSTHORACIC ECHOCARDIOGRAM  02/21/2021   EF 60-65%, all normal except ascending aorta 42 mm.  CT  chest 04/29/21 showed same measurement.   TRANSURETHRAL RESECTION OF PROSTATE N/A 08/25/2012   Procedure: TRANSURETHRAL RESECTION OF THE PROSTATE WITH GYRUS INSTRUMENTS;  Surgeon: Alexis Frock, MD;  Location: [facility];  Service: Urology;  Laterality: N/A;--patient got excellent results from procedure.    Outpatient Medications Prior to Visit  Medication Sig Dispense Refill   albuterol (VENTOLIN HFA) 108 (90 Base) MCG/ACT inhaler Inhale 2 puffs into the lungs every 4 (four) hours as needed for shortness of breath. 18 g 1   aspirin EC 81 MG tablet Take 81 mg by mouth daily.     B Complex-Folic Acid (B COMPLEX-VITAMIN B12 PO) Take 1 capsule by mouth daily. Unknown strenght     Calcium-Magnesium-Zinc 167-83-8 MG TABS Take 1 tablet by mouth 2 (two) times daily after a meal.     cholecalciferol (VITAMIN D) 1000 units tablet Take 1,000 Units by mouth daily.     ePHEDrine HCl (PRIMATENE) 12.5 MG TABS Take 12.5 mg by mouth as needed (SOB, Wheezing).     EPINEPHrine 0.3 mg/0.3 mL IJ SOAJ injection INJECT INTO OUTER THIGH AS NEEDED FOR ANAPHYLAXIS (Patient taking differently: Inject 0.3 mg into the muscle as needed for anaphylaxis. INJECT INTO OUTER THIGH AS NEEDED FOR ANAPHYLAXIS) 1 each 2   fluorouracil (EFUDEX) 5 % cream Apply 1 application topically daily as needed (rash).  0   Olopatadine HCl 0.6 % SOLN Place 2 sprays into both nostrils 2 (two) times daily.     psyllium (METAMUCIL) 58.6 % powder Take 1 packet by mouth 2 (two) times daily.      senna (SENOKOT) 8.6 MG tablet Take 2 tablets by mouth 2 (two) times daily as needed for constipation.     tadalafil (CIALIS) 10 MG tablet 1-2 tabs po qd prn (Patient taking differently: Take 10 mg by mouth daily as needed for erectile dysfunction. 1-2 tabs po qd prn) 10 tablet 6   vitamin C (ASCORBIC ACID) 500 MG tablet Take 500 mg by mouth daily.     losartan (COZAAR) 25 MG tablet Take 1 tablet (25 mg total) by mouth daily. 90 tablet 3   amoxicillin-clavulanate  (AUGMENTIN) 875-125 MG tablet Take 1 tablet by mouth 2 (two) times daily. (Patient not taking: Reported on 07/31/2021) 20 tablet 0   No facility-administered medications prior to visit.    Allergies  Allergen Reactions   Molds & Smuts Other (See Comments)    ROS As per HPI  PE: Vitals with BMI 07/31/2021 07/31/2021 04/26/2021  Height - '5\' 10"'  '5\' 10"'   Weight - 229 lbs 10 oz 227 lbs 10 oz  BMI - 00.93 81.82  Systolic 993 716 967  Diastolic 84 79 60  Pulse - 62 63   Manual bp rpt at end of visit today was 130/84.  Physical Exam  Gen: Alert, well appearing.  Patient is oriented to person, place, time, and situation. AFFECT: pleasant, lucid thought and speech. CV: RRR, no m/r/g.   LUNGS: CTA bilat, nonlabored resps, good aeration in all lung fields.  LABS:  Last CBC Lab Results  Component Value Date   WBC 4.0 04/26/2021   HGB 14.3 04/26/2021   HCT 43.3 04/26/2021   MCV 94.2 04/26/2021   MCH 30.3 09/11/2012   RDW 13.8 04/26/2021   PLT 240.0 58/85/0277   Last metabolic panel Lab Results  Component Value Date   GLUCOSE 107 (H) 04/26/2021   NA 138 04/26/2021   K 4.5 04/26/2021   CL 102 04/26/2021   CO2 29 04/26/2021   BUN 12 04/26/2021   CREATININE 1.03 04/26/2021   EGFR 86 04/18/2021   CALCIUM 9.7 04/26/2021   PROT 7.0 04/26/2021   ALBUMIN 4.3 04/26/2021   BILITOT 0.6 04/26/2021   ALKPHOS 66 04/26/2021   AST 16 04/26/2021   ALT 23 04/26/2021   Last lipids Lab Results  Component Value Date   CHOL 163 04/26/2021   HDL 58.30 04/26/2021   LDLCALC 84 04/26/2021   TRIG 104.0 04/26/2021   CHOLHDL 3 04/26/2021   Last hemoglobin A1c Lab Results  Component Value Date   HGBA1C 5.9 04/26/2021   Last thyroid functions Lab Results  Component Value Date   TSH 1.71 04/26/2021   T3TOTAL 102.6 06/04/2012   IMPRESSION AND PLAN:  #1 hypertension.  Historically well controlled on 25 mg losartan. Initial BP up today but back to normal on manual at the end of  visit. Instructions: Check your blood pressure every 1-2 days for the next 10 days. Call and let us know if your blood pressure average is >130 on top or > 80 on bottom. Electrolytes and creatinine today.  2.  Abnormal CT chest October 2022.  This was done as part of cardiologist work-up of aortic dilatation. This showed a focal area of subpleural consolidation within the posterior right base.  It was favored to represent an area of pneumonia/or aspiration.  Signs of mucous plugging and/or aspiration within the segmental and subsegmental right lower lobe airway were present.  Radiologist recommended follow-up CT of the chest in 3 months.  He is due for this now and I have ordered it. He completed a course of Augmentin back in November 2022. He is asymptomatic.  An After Visit Summary was printed and given to the patient.  FOLLOW UP: Return in about 9 months (around 04/30/2022) for annual CPE (fasting).  Signed:  Crissie Sickles, MD           07/31/2021

## 2021-07-31 NOTE — Patient Instructions (Signed)
Check your blood pressure every 1-2 days for the next 10 days. Call and let us know if your blood pressure average is >130 on top or > 80 on bottom.

## 2021-08-01 ENCOUNTER — Ambulatory Visit (HOSPITAL_BASED_OUTPATIENT_CLINIC_OR_DEPARTMENT_OTHER): Payer: Managed Care, Other (non HMO)

## 2021-08-06 ENCOUNTER — Telehealth: Payer: Self-pay | Admitting: Family Medicine

## 2021-08-06 NOTE — Telephone Encounter (Signed)
Okay for letter

## 2021-08-06 NOTE — Telephone Encounter (Signed)
Pt called and said that he was at [employer] and he was trying to get set up with a trainer but that they say he needs a release from his doctor in order to do so and he asked if he can get a letter. Call back [phone]

## 2021-08-07 NOTE — Telephone Encounter (Signed)
Yes ok for letter. thx

## 2021-08-07 NOTE — Telephone Encounter (Signed)
Letter completed and sent to pt via MyChart. Pt was made aware.

## 2021-08-16 ENCOUNTER — Ambulatory Visit: Payer: Managed Care, Other (non HMO) | Admitting: Cardiology

## 2021-08-29 ENCOUNTER — Ambulatory Visit: Payer: Managed Care, Other (non HMO) | Admitting: Nurse Practitioner

## 2021-08-29 ENCOUNTER — Telehealth: Payer: Self-pay | Admitting: Internal Medicine

## 2021-08-29 NOTE — Telephone Encounter (Signed)
Since 08/28/21 8 am having allergic reaction through the day taking benadryl wife calling not helping and c/w his breathing not sure what allergic to   Wife declined ED as rec and was told per wife/pt per PCP just call in for steroids which they are requesting after hours ~12:30 am   Please clarify with pt and wife this is not provided by the on call w/o evaluation and ED would have been best during this time unless PCP gives epi pen or steroids prn

## 2021-08-29 NOTE — Telephone Encounter (Signed)
Need to talk to patient and get what his report of the symptoms are. I never said anything about calling for steroids.  Maybe they were thinking of his allergist (Dr. Orvil Feil)?? Further decisions to be made when I get more info.-thx

## 2021-08-29 NOTE — Telephone Encounter (Signed)
Noted  

## 2021-08-29 NOTE — Telephone Encounter (Signed)
FYI- Spoke with pt and he states he had an appt scheduled with someone at [facility] office but he was able to get an appt with his allergist for this afternoon. He cancelled appt with [place] office. Nothing else needed currently.  [place] Night - Client TELEPHONE ADVICE RECORD [facility] Patient Name: Brian Walsh Gender: Male DOB: Feb 01, 1961 Age: 61 Y 9 M 22 D Return Phone Number: [phone] (Primary), [phone] (Secondary) Address: City/ State/ Zip: [place]  [address] Client [place] Night - Client Client Site [place] Night Provider Crissie Sickles - MD Contact Type Call Who Is Calling Patient / Member / Family / Caregiver Call Type Triage / Clinical Caller Name Somtochukwu Woollard Relationship To Patient Spouse Return Phone Number [phone] (Primary) Chief Complaint Facial Swelling Reason for Call Symptomatic / Request for [place] states her husband had an allergic reaction that caused swelling in the face, eyes and lips, head is itching, pt is breathing faster. Pt is taking Benadryl. Is there anything else that pt could take? Translation No Nurse Assessment Nurse: [place], RN, Cecille Rubin Date/Time (Eastern Time): 08/29/2021 12:45:34 AM Confirm and document reason for call. If symptomatic, describe symptoms. ---Caller reports that patient began having an allergic reaction yesterday morning after eating at a [employer] on 08/27/21. Took a Benadryl at 8am when he developed swelling in the face, eyes and around the lips. Another Benadryl dose taken at 2:30pm due to itchiness on the scalp and ears. Another dose of Benadryl at 5pm because patient developed red, raised and quarter sized welts on his back, arms, neck and groin (sizes vary). Denies difficulty breathing or swallowing. Does the patient have any new or worsening symptoms? ---Yes Will a triage be completed?  ---Yes Related visit to physician within the last 2 weeks? ---N/A Does the PT have any chronic conditions? (i.e. diabetes, asthma, this includes High risk factors for pregnancy, etc.) ---Yes List chronic conditions. ---MS, Asthma, HTN Is this a behavioral health or substance abuse call? ---No PLEASE NOTE: All timestamps contained within this report are represented as [place] Standard Time. CONFIDENTIALTY NOTICE: This fax transmission is intended only for the addressee. It contains information that is legally privileged, confidential or otherwise protected from use or disclosure. If you are not the intended recipient, you are strictly prohibited from reviewing, disclosing, copying using or disseminating any of this information or taking any action in reliance on or regarding this information. If you have received this fax in error, please notify us immediately by telephone so that we can arrange for its return to [place]. Phone: [phone], Toll-Free: [phone], Fax: [phone] Page: 2 of 3 Call Id: [id number]

## 2021-09-04 ENCOUNTER — Ambulatory Visit: Payer: Managed Care, Other (non HMO) | Admitting: Neurology

## 2021-09-10 ENCOUNTER — Ambulatory Visit: Payer: Managed Care, Other (non HMO) | Admitting: Neurology

## 2021-10-16 ENCOUNTER — Ambulatory Visit: Payer: Managed Care, Other (non HMO) | Admitting: Neurology

## 2021-10-17 ENCOUNTER — Ambulatory Visit: Payer: Managed Care, Other (non HMO) | Admitting: Family Medicine

## 2021-10-17 ENCOUNTER — Encounter: Payer: Self-pay | Admitting: Family Medicine

## 2021-10-17 VITALS — BP 125/74 | HR 83 | Temp 98.6°F | Ht 70.0 in | Wt 226.6 lb

## 2021-10-17 DIAGNOSIS — R059 Cough, unspecified: Secondary | ICD-10-CM | POA: Diagnosis not present

## 2021-10-17 NOTE — Patient Instructions (Addendum)
[facility] ?Address: [place], [place],  [address] ?Phone: [phone] ? ?Contact to schedule CT after new insurance starts ?

## 2021-10-17 NOTE — Progress Notes (Signed)
OFFICE VISIT ? ?10/17/2021 ? ?CC:  ?Chief Complaint  ?Patient presents with  ? Cough  ?  Has seasonal allergies. Wife is concerned that CPAP mask is causing him to cough.    ? ? ?Patient is a 61 y.o. male who presents for cough ? ?HPI: ?Occasional cough, has chronic postnasal drip that waxes and wanes.  Has bad environmental allergens, gets maintenance immunotherapy injections. ?Has asthma which typically does not bother him unless he gets high anxiety, has not required any controller therapy. ?He will occasionally use Symbicort if he feels chest tightness but this is not often. ?No shortness of breath, no fever, no malaise, no chest pain. ?He says he feels great. ?He does use CPAP, cleans this and maintains it splendidly. ? ?Past Medical History:  ?Diagnosis Date  ? Allergic rhinitis   ? Much improved with allergy immunotherapy. Restart immunotherapy w/Dr. Whelan winter 2020 (cat/dog dander, pollens).  ? Asthma   ? well controlled since allergies under control with immunotherapy, has albut  ? Blood transfusion without reported diagnosis   ? BPH (benign prostatic hypertrophy) 2014  ? TURP  ? Chronic constipation   ? Chronic sinusitis   ? Deviated nasal septum: >90% airway obstruction on right.  Dr. Wilburn Cornelia discussed possible septoplasty and turbinate reduction with pt in 2015.  ? Constipation, chronic 11/24/2011  ? H/O blood clots   ? in eye   ? H/O nonmelanoma skin cancer   ? History of adenomatous polyp of colon   ? Most recent colonoscopy 07/2017---recall 5 yrs.  ? IFG (impaired fasting glucose) 2019; 2021  ? A1c 5.8% 2019 and 2021.  ? Lower extremity venous stasis   ? OSA on CPAP 2005  ? cpap nightly  ? Personal history of colonic polyps 03/24/2012  ? 03/2012 - 5 mm cecal adenoma  ? PUD (peptic ulcer disease)   ? Bleeding ulcer 1978  ? Relapsing remitting multiple sclerosis ([place]) 1992  ? Remission since 1996 on no meds.  Initial presentation was gait/balance and leg weakness sx's.  Additional relapses with blurry  vision and uncoordination.  Dr. Tomi Likens 04/2017 recommended re-estab baseline MRI brain and C-spine + start disease modifying therapy but pt declined.  ? Thyroid nodule 05/2012  ? Right lobe 59mX14mm nodule found on screening thyroid and carotid u/s done through his employer.  Pt was euthyroid at that time.  A radioactive iodine uptake and scan was normal 06/2012.  FNA bx 04/2015 was BENIGN/CYST.  ? ? ?Past Surgical History:  ?Procedure Laterality Date  ? APPENDECTOMY  1986  ? COLONOSCOPY  2006, 2013; 07/2017  ? diminutive hyperplastic cecal polyp ([place]); diminutive cecal adenoma removed 2013.  Repeat 07/24/17, one polyp--recall 5 yrs.  [place].  Rod removal 1979  ? FNA R Thyroid nodule  04/17/2015  ? Benign/cystic (Dr. GCruzita Lederer  ? INGUINAL HERNIA REPAIR Right 10/22/2015  ? Procedure: LAPAROSCOPIC RIGHT INGUINAL HERNIA REPAIR;  Surgeon: LArta BruceKinsinger, MD;  [facility]  Service: General;  Laterality: Right;  ? INSERTION OF MESH Right 10/22/2015  ? Procedure: INSERTION OF MESH;  Surgeon: LMickeal Skinner MD;  [facility]  Service: General;  Laterality: Right;  ? TRANSTHORACIC ECHOCARDIOGRAM  02/21/2021  ? EF 60-65%, all normal except ascending aorta 42 mm.  CT chest 04/29/21 showed same measurement.  ? TRANSURETHRAL RESECTION OF PROSTATE N/A 08/25/2012  ? Procedure: TRANSURETHRAL RESECTION OF THE PROSTATE WITH GYRUS INSTRUMENTS;  Surgeon: Alexis Frock, MD;  Location[facility];  Service: Urology;  Laterality: N/A;--patient got excellent results from procedure.  ? ? ?Outpatient Medications Prior to Visit  ?Medication Sig Dispense Refill  ? aspirin EC 81 MG tablet Take 81 mg by mouth daily.    ? B Complex-Folic Acid (B COMPLEX-VITAMIN B12 PO) Take 1 capsule by mouth daily. Unknown strenght    ? Calcium-Magnesium-Zinc 167-83-8 MG TABS Take 1 tablet by mouth 2 (two) times daily after a meal.    ? cholecalciferol  (VITAMIN D) 1000 units tablet Take 1,000 Units by mouth daily.    ? ePHEDrine HCl (PRIMATENE) 12.5 MG TABS Take 12.5 mg by mouth as needed (SOB, Wheezing).    ? fluorouracil (EFUDEX) 5 % cream Apply 1 application topically daily as needed (rash).  0  ? Olopatadine HCl 0.6 % SOLN Place 2 sprays into both nostrils 2 (two) times daily.    ? psyllium (METAMUCIL) 58.6 % powder Take 1 packet by mouth 2 (two) times daily.     ? senna (SENOKOT) 8.6 MG tablet Take 2 tablets by mouth 2 (two) times daily as needed for constipation.    ? SYMBICORT 80-4.5 MCG/ACT inhaler Inhale 2 puffs into the lungs 2 (two) times daily.    ? tadalafil (CIALIS) 10 MG tablet 1-2 tabs po qd prn (Patient taking differently: Take 10 mg by mouth daily as needed for erectile dysfunction. 1-2 tabs po qd prn) 10 tablet 6  ? vitamin C (ASCORBIC ACID) 500 MG tablet Take 500 mg by mouth daily.    ? albuterol (VENTOLIN HFA) 108 (90 Base) MCG/ACT inhaler INHALE 2 PUFFS INTO THE LUNGS EVERY 4 HOURS AS NEEDED FOR SHORTNESS OF BREATH (Patient not taking: Reported on 10/17/2021) 18 g 1  ? EPINEPHrine 0.3 mg/0.3 mL IJ SOAJ injection INJECT INTO OUTER THIGH AS NEEDED FOR ANAPHYLAXIS (Patient not taking: Reported on 10/17/2021) 1 each 2  ? losartan (COZAAR) 25 MG tablet Take 1 tablet (25 mg total) by mouth daily. 90 tablet 3  ? ?No facility-administered medications prior to visit.  ? ? ?Allergies  ?Allergen Reactions  ? Molds & Smuts Other (See Comments)  ? ? ?ROS ?As per HPI ? ?PE: ? ?  10/17/2021  ?  1:16 PM 07/31/2021  ?  9:39 AM 07/31/2021  ?  9:17 AM  ?Vitals with BMI  ?Height '5\' 10"'   '5\' 10"'   ?Weight 226 lbs 10 oz  229 lbs 10 oz  ?BMI 32.51  32.94  ?Systolic 370 488 891  ?Diastolic 74 84 79  ?Pulse 83  62  ?02 sat 97% ? ?Physical Exam ? ?Gen: Alert, well appearing.  Patient is oriented to person, place, time, and situation. ?AFFECT: pleasant, lucid thought and speech. ?ENT: Ears: EACs clear, normal epithelium.  TMs with good light reflex and landmarks bilaterally.   Eyes: no injection, icteris, swelling, or exudate.  EOMI, PERRLA. ?Nose: no drainage or turbinate edema/swelling.  No injection or focal lesion.  Mouth: lips without lesion/swelling.  Oral mucosa pink and moist.  Dentition intact and without obvious caries or gingival swelling.  Oropharynx without erythema, exudate, or swelling.  ?CV: RRR, no m/r/g.   ?LUNGS: CTA bilat, nonlabored resps, good aeration in all lung fields. ?EXT: no clubbing or cyanosis.  no edema.  ? ? ?LABS:  ?Last CBC ?Lab Results  ?Component Value Date  ? WBC 4.0 04/26/2021  ? HGB 14.3 04/26/2021  ? HCT 43.3 04/26/2021  ? MCV 94.2 04/26/2021  ? MCH 30.3  09/11/2012  ? RDW 13.8 04/26/2021  ? PLT 240.0 04/26/2021  ? ?Last metabolic panel ?Lab Results  ?Component Value Date  ? GLUCOSE 104 (H) 07/31/2021  ? NA 141 07/31/2021  ? K 4.4 07/31/2021  ? CL 104 07/31/2021  ? CO2 28 07/31/2021  ? BUN 14 07/31/2021  ? CREATININE 1.06 07/31/2021  ? EGFR 86 04/18/2021  ? CALCIUM 9.7 07/31/2021  ? PROT 7.0 04/26/2021  ? ALBUMIN 4.3 04/26/2021  ? BILITOT 0.6 04/26/2021  ? ALKPHOS 66 04/26/2021  ? AST 16 04/26/2021  ? ALT 23 04/26/2021  ? ?IMPRESSION AND PLAN: ? ?Cough. ?I think this is related to his allergies/asthma.  It is minimally bothersome to him.   ?He is reassured. ?He has not yet scheduled the CT chest that we ordered to follow-up his subtle abnormality on October 2022 CT chest (see details from my progress note 07/31/21). ?He changes over insurance at the beginning of May so he will wait till then to schedule this. ? ?An After Visit Summary was printed and given to the patient. ? ?FOLLOW UP: Return for as needed. ? ?Signed:  Crissie Sickles, MD           10/17/2021 ? ? ?

## 2021-10-24 ENCOUNTER — Encounter: Payer: Self-pay | Admitting: Family Medicine

## 2021-11-15 DIAGNOSIS — J301 Allergic rhinitis due to pollen: Secondary | ICD-10-CM | POA: Diagnosis not present

## 2021-11-15 DIAGNOSIS — J3089 Other allergic rhinitis: Secondary | ICD-10-CM | POA: Diagnosis not present

## 2021-11-25 ENCOUNTER — Ambulatory Visit: Payer: Managed Care, Other (non HMO) | Admitting: Neurology

## 2021-11-25 DIAGNOSIS — J3089 Other allergic rhinitis: Secondary | ICD-10-CM | POA: Diagnosis not present

## 2021-11-25 DIAGNOSIS — J301 Allergic rhinitis due to pollen: Secondary | ICD-10-CM | POA: Diagnosis not present

## 2021-12-02 ENCOUNTER — Ambulatory Visit: Payer: Managed Care, Other (non HMO) | Admitting: Family Medicine

## 2021-12-02 ENCOUNTER — Ambulatory Visit (INDEPENDENT_AMBULATORY_CARE_PROVIDER_SITE_OTHER): Payer: BC Managed Care – PPO | Admitting: Family Medicine

## 2021-12-02 ENCOUNTER — Encounter: Payer: Self-pay | Admitting: Family Medicine

## 2021-12-02 ENCOUNTER — Telehealth: Payer: Self-pay

## 2021-12-02 VITALS — BP 125/71 | HR 77 | Temp 97.8°F | Ht 70.0 in | Wt 229.6 lb

## 2021-12-02 DIAGNOSIS — J0191 Acute recurrent sinusitis, unspecified: Secondary | ICD-10-CM

## 2021-12-02 MED ORDER — AMOXICILLIN-POT CLAVULANATE 875-125 MG PO TABS: 1.0000 | ORAL_TABLET | Freq: Two times a day (BID) | ORAL | 0 refills | Status: DC

## 2021-12-02 NOTE — Progress Notes (Signed)
OFFICE VISIT  12/02/2021  CC:  Chief Complaint  Patient presents with   Nasal Congestion    congestion/nasal drainage & discomfort started 05/21; saline rinse twice daily   Patient is a 61 y.o. male who presents for nasal/sinus congestion  HPI: He has chronic allergic rhinitis and history of recurrent infectious sinusitis. He has noted gradually worsening nasal congestion and thick yellow nasal mucus, acutely worsened in severity in the last 24 hours.  Feels significant sinus weight/pressure.  Has a sinus headache.  No significant cough, no sore throat, no fever, no wheezing or shortness of breath. He does gentle saline nasal rinses daily and this has been effective for preventing recurrent sinus infections in the past. He gets allergy immunotherapy.  Past Medical History:  Diagnosis Date   Allergic rhinitis    Much improved with allergy immunotherapy. Restart immunotherapy w/Dr. Whelan winter 2020 (cat/dog dander, pollens).   Asthma    well controlled since allergies under control with immunotherapy, has albut   Blood transfusion without reported diagnosis    BPH (benign prostatic hypertrophy) 2014   TURP   Chronic constipation    Chronic sinusitis    Deviated nasal septum: >90% airway obstruction on right.  Dr. Wilburn Cornelia discussed possible septoplasty and turbinate reduction with pt in 2015.   Constipation, chronic 11/24/2011   H/O blood clots    in eye    H/O nonmelanoma skin cancer    History of adenomatous polyp of colon    Most recent colonoscopy 07/2017---recall 5 yrs.   Idiopathic urticaria    IFG (impaired fasting glucose) 2019; 2021   A1c 5.8% 2019 and 2021.   Lower extremity venous stasis    OSA on CPAP 2005   cpap nightly   Personal history of colonic polyps 03/24/2012   03/2012 - 5 mm cecal adenoma   PUD (peptic ulcer disease)    Bleeding ulcer 1978   Relapsing remitting multiple sclerosis ([place]) 1992   Remission since 1996 on no meds.  Initial presentation was  gait/balance and leg weakness sx's.  Additional relapses with blurry vision and uncoordination.  Dr. Tomi Likens 04/2017 recommended re-estab baseline MRI brain and C-spine + start disease modifying therapy but pt declined.   Thyroid nodule 05/2012   Right lobe 36mX14mm nodule found on screening thyroid and carotid u/s done through his employer.  Pt was euthyroid at that time.  A radioactive iodine uptake and scan was normal 06/2012.  FNA bx 04/2015 was BENIGN/CYST.    Past Surgical History:  Procedure Laterality Date   APPENDECTOMY  1986   COLONOSCOPY  2006, 2013; 07/2017   diminutive hyperplastic cecal polyp ([place]); diminutive cecal adenoma removed 2013.  Repeat 07/24/17, one polyp--recall 5 yrs.   FEMUR FRACTURE SURGERY  1978   with rod insertion.  Rod removal 1979   FNA R Thyroid nodule  04/17/2015   Benign/cystic (Dr. GCruzita Lederer   [place]Right 10/22/2015   Procedure: LAPAROSCOPIC RIGHT INGUINAL HERNIA REPAIR;  Surgeon: LMickeal Skinner MD;  [facility]  Service: General;  Laterality: Right;   INSERTION OF MESH Right 10/22/2015   Procedure: INSERTION OF MESH;  Surgeon: LMickeal Skinner MD;  [facility] [facility]  Service: General;  Laterality: Right;   TRANSTHORACIC ECHOCARDIOGRAM  02/21/2021   EF 60-65%, all normal except ascending aorta 42 mm.  CT chest 04/29/21 showed same measurement.   TRANSURETHRAL RESECTION OF PROSTATE N/A 08/25/2012   Procedure: TRANSURETHRAL RESECTION OF THE PROSTATE WITH GYRUS  INSTRUMENTS;  Surgeon: Alexis Frock, MD;  Location: [facility];  Service: Urology;  Laterality: N/A;--patient got excellent results from procedure.    Outpatient Medications Prior to Visit  Medication Sig Dispense Refill   aspirin EC 81 MG tablet Take 81 mg by mouth daily.     B Complex-Folic Acid (B COMPLEX-VITAMIN B12 PO) Take 1 capsule by mouth daily. Unknown strenght     Calcium-Magnesium-Zinc 167-83-8 MG TABS Take 1 tablet  by mouth 2 (two) times daily after a meal.     cholecalciferol (VITAMIN D) 1000 units tablet Take 1,000 Units by mouth daily.     ePHEDrine HCl (PRIMATENE) 12.5 MG TABS Take 12.5 mg by mouth as needed (SOB, Wheezing).     fluorouracil (EFUDEX) 5 % cream Apply 1 application topically daily as needed (rash).  0   Olopatadine HCl 0.6 % SOLN Place 2 sprays into both nostrils 2 (two) times daily.     psyllium (METAMUCIL) 58.6 % powder Take 1 packet by mouth 2 (two) times daily.      senna (SENOKOT) 8.6 MG tablet Take 2 tablets by mouth 2 (two) times daily as needed for constipation.     SYMBICORT 80-4.5 MCG/ACT inhaler Inhale 2 puffs into the lungs 2 (two) times daily.     tadalafil (CIALIS) 10 MG tablet 1-2 tabs po qd prn (Patient taking differently: Take 10 mg by mouth daily as needed for erectile dysfunction. 1-2 tabs po qd prn) 10 tablet 6   vitamin C (ASCORBIC ACID) 500 MG tablet Take 500 mg by mouth daily.     albuterol (VENTOLIN HFA) 108 (90 Base) MCG/ACT inhaler INHALE 2 PUFFS INTO THE LUNGS EVERY 4 HOURS AS NEEDED FOR SHORTNESS OF BREATH (Patient not taking: Reported on 10/17/2021) 18 g 1   EPINEPHrine 0.3 mg/0.3 mL IJ SOAJ injection INJECT INTO OUTER THIGH AS NEEDED FOR ANAPHYLAXIS (Patient not taking: Reported on 10/17/2021) 1 each 2   losartan (COZAAR) 25 MG tablet Take 1 tablet (25 mg total) by mouth daily. 90 tablet 3   No facility-administered medications prior to visit.    Allergies  Allergen Reactions   Molds & Smuts Other (See Comments)    ROS As per HPI  PE:    12/02/2021    4:29 PM 10/17/2021    1:16 PM 07/31/2021    9:39 AM  Vitals with BMI  Height '5\' 10"'  '5\' 10"'    Weight 229 lbs 10 oz 226 lbs 10 oz   BMI 65.03 54.65   Systolic 681 275 170  Diastolic 71 74 84  Pulse 77 83      Physical Exam  VS: noted--normal. Gen: alert, NAD, NONTOXIC APPEARING. HEENT: eyes without injection, drainage, or swelling.  Ears: EACs clear, TMs with normal light reflex and landmarks.   Nose: Edematous and mildly injected mucosa.  No purulent d/c visible.  No paranasal sinus TTP.  No facial swelling.  Throat and mouth without focal lesion.  No pharyngial swelling, erythema, or exudate.   Neck: supple, no LAD.   LUNGS: CTA bilat, nonlabored resps.   CV: RRR, no m/r/g. EXT: no c/c/e SKIN: no rash   LABS:  Last CBC Lab Results  Component Value Date   WBC 4.0 04/26/2021   HGB 14.3 04/26/2021   HCT 43.3 04/26/2021   MCV 94.2 04/26/2021   MCH 30.3 09/11/2012   RDW 13.8 04/26/2021   PLT 240.0 01/74/9449   Last metabolic panel Lab Results  Component Value Date   GLUCOSE 104 (H)  07/31/2021   NA 141 07/31/2021   K 4.4 07/31/2021   CL 104 07/31/2021   CO2 28 07/31/2021   BUN 14 07/31/2021   CREATININE 1.06 07/31/2021   EGFR 86 04/18/2021   CALCIUM 9.7 07/31/2021   PROT 7.0 04/26/2021   ALBUMIN 4.3 04/26/2021   BILITOT 0.6 04/26/2021   ALKPHOS 66 04/26/2021   AST 16 04/26/2021   ALT 23 04/26/2021   IMPRESSION AND PLAN:  Acute recurrent sinusitis. Augmentin 875 twice daily x10 days. Continue gentle saline nasal rinse. No sign of asthma exacerbation at this time.  An After Visit Summary was printed and given to the patient.  FOLLOW UP: Return if symptoms worsen or fail to improve.  Signed:  Crissie Sickles, MD           12/02/2021

## 2021-12-02 NOTE — Telephone Encounter (Signed)
A user error has taken place: encounter opened in error, closed for administrative reasons.

## 2021-12-06 DIAGNOSIS — J3089 Other allergic rhinitis: Secondary | ICD-10-CM | POA: Diagnosis not present

## 2021-12-06 DIAGNOSIS — J3081 Allergic rhinitis due to animal (cat) (dog) hair and dander: Secondary | ICD-10-CM | POA: Diagnosis not present

## 2021-12-06 DIAGNOSIS — J301 Allergic rhinitis due to pollen: Secondary | ICD-10-CM | POA: Diagnosis not present

## 2021-12-24 DIAGNOSIS — J3089 Other allergic rhinitis: Secondary | ICD-10-CM | POA: Diagnosis not present

## 2021-12-24 DIAGNOSIS — J301 Allergic rhinitis due to pollen: Secondary | ICD-10-CM | POA: Diagnosis not present

## 2021-12-24 DIAGNOSIS — J3081 Allergic rhinitis due to animal (cat) (dog) hair and dander: Secondary | ICD-10-CM | POA: Diagnosis not present

## 2022-01-15 DIAGNOSIS — J301 Allergic rhinitis due to pollen: Secondary | ICD-10-CM | POA: Diagnosis not present

## 2022-01-15 DIAGNOSIS — J3089 Other allergic rhinitis: Secondary | ICD-10-CM | POA: Diagnosis not present

## 2022-01-28 DIAGNOSIS — J3081 Allergic rhinitis due to animal (cat) (dog) hair and dander: Secondary | ICD-10-CM | POA: Diagnosis not present

## 2022-01-28 DIAGNOSIS — J301 Allergic rhinitis due to pollen: Secondary | ICD-10-CM | POA: Diagnosis not present

## 2022-01-28 DIAGNOSIS — J3089 Other allergic rhinitis: Secondary | ICD-10-CM | POA: Diagnosis not present

## 2022-02-05 ENCOUNTER — Other Ambulatory Visit: Payer: Self-pay

## 2022-02-05 DIAGNOSIS — I878 Other specified disorders of veins: Secondary | ICD-10-CM

## 2022-02-05 MED ORDER — LOSARTAN POTASSIUM 25 MG PO TABS: 25.0000 mg | ORAL_TABLET | Freq: Every day | ORAL | 0 refills | Status: DC

## 2022-02-11 DIAGNOSIS — J301 Allergic rhinitis due to pollen: Secondary | ICD-10-CM | POA: Diagnosis not present

## 2022-02-11 DIAGNOSIS — J3081 Allergic rhinitis due to animal (cat) (dog) hair and dander: Secondary | ICD-10-CM | POA: Diagnosis not present

## 2022-02-11 DIAGNOSIS — J3089 Other allergic rhinitis: Secondary | ICD-10-CM | POA: Diagnosis not present

## 2022-03-24 ENCOUNTER — Ambulatory Visit: Payer: BC Managed Care – PPO | Admitting: Family Medicine

## 2022-03-24 ENCOUNTER — Encounter: Payer: Self-pay | Admitting: Family Medicine

## 2022-03-24 VITALS — BP 138/73 | HR 77 | Temp 98.4°F | Ht 70.0 in | Wt 228.0 lb

## 2022-03-24 DIAGNOSIS — J0191 Acute recurrent sinusitis, unspecified: Secondary | ICD-10-CM | POA: Diagnosis not present

## 2022-03-24 DIAGNOSIS — R6882 Decreased libido: Secondary | ICD-10-CM | POA: Diagnosis not present

## 2022-03-24 DIAGNOSIS — J342 Deviated nasal septum: Secondary | ICD-10-CM

## 2022-03-24 DIAGNOSIS — J309 Allergic rhinitis, unspecified: Secondary | ICD-10-CM | POA: Diagnosis not present

## 2022-03-24 DIAGNOSIS — R5383 Other fatigue: Secondary | ICD-10-CM | POA: Diagnosis not present

## 2022-03-24 LAB — TESTOSTERONE: Testosterone: 308.2 ng/dL (ref 300.00–890.00)

## 2022-03-24 LAB — VITAMIN B12: Vitamin B-12: 142 pg/mL — ABNORMAL LOW (ref 211–911)

## 2022-03-24 MED ORDER — AMOXICILLIN-POT CLAVULANATE 875-125 MG PO TABS: 1.0000 | ORAL_TABLET | Freq: Two times a day (BID) | ORAL | 0 refills | Status: DC

## 2022-03-24 NOTE — Progress Notes (Signed)
OFFICE VISIT  03/24/2022  CC:  Chief Complaint  Patient presents with   Nasal Congestion    Drainage, pt has seasonal allergies. Denies any chest congestion. Has used 2 different otc meds, unable to recall exact name. One was olopatadine for nasal passages.     Patient is a 61 y.o. male who presents for sinus issues.  HPI:  Brian Walsh has a history of recurrent sinusitis.  In the last 5 days he has noticed his chronic light yellow mucus turned to darker and thicker green mucus.  Nasal and facial congestion has increased.  He does for saline sinus/nasal rinses a day, takes Flonase daily, takes Claritin daily.   I last treated him for acute sinusitis about 4 months ago (augmentin).  He has chronic allergic rhinitis and gets immunotherapy for this.  He is not really convinced that immunotherapy has made any change in his chronic symptoms. He has a deviated nasal septum.  He has never had nasal or sinus surgery, but this was offered by ENT in the past and he declined because it was not the right time for him to consider taking this step.  He also asks about help for feeling of chronic fatigue, asks about the safety of taking supplemental vitamins such as "Mega men" that he gets at [facility]. He does endorse decreased libido as well.  Review of systems: No cough, no fever, no sore throat.  No tingling or numbness.  No melena or hematochezia. No problem with headaches or vision abnormalities.  Past Medical History:  Diagnosis Date   Allergic rhinitis    Much improved with allergy immunotherapy. Restart immunotherapy w/Dr. Whelan winter 2020 (cat/dog dander, pollens).   Asthma    well controlled since allergies under control with immunotherapy, has albut   Blood transfusion without reported diagnosis    BPH (benign prostatic hypertrophy) 2014   TURP   Chronic constipation    Chronic sinusitis    Deviated nasal septum: >90% airway obstruction on right.  Dr. Wilburn Cornelia discussed possible septoplasty and  turbinate reduction with pt in 2015.   Constipation, chronic 11/24/2011   H/O blood clots    in eye    H/O nonmelanoma skin cancer    History of adenomatous polyp of colon    Most recent colonoscopy 07/2017---recall 5 yrs.   Idiopathic urticaria    IFG (impaired fasting glucose) 2019; 2021   A1c 5.8% 2019 and 2021.   Lower extremity venous stasis    OSA on CPAP 2005   cpap nightly   Personal history of colonic polyps 03/24/2012   03/2012 - 5 mm cecal adenoma   PUD (peptic ulcer disease)    Bleeding ulcer 1978   Relapsing remitting multiple sclerosis ([place]) 1992   Remission since 1996 on no meds.  Initial presentation was gait/balance and leg weakness sx's.  Additional relapses with blurry vision and uncoordination.  Dr. Tomi Likens 04/2017 recommended re-estab baseline MRI brain and C-spine + start disease modifying therapy but pt declined.   Thyroid nodule 05/2012   Right lobe 8mX14mm nodule found on screening thyroid and carotid u/s done through his employer.  Pt was euthyroid at that time.  A radioactive iodine uptake and scan was normal 06/2012.  FNA bx 04/2015 was BENIGN/CYST.    Past Surgical History:  Procedure Laterality Date   APPENDECTOMY  1986   COLONOSCOPY  2006, 2013; 07/2017   diminutive hyperplastic cecal polyp ([place]); diminutive cecal adenoma removed 2013.  Repeat 07/24/17, one polyp--recall 5 yrs.  FEMUR FRACTURE SURGERY  1978   with rod insertion.  Rod removal 1979   FNA R Thyroid nodule  04/17/2015   Benign/cystic (Dr. Cruzita Lederer)   [place] Right 10/22/2015   Procedure: LAPAROSCOPIC RIGHT INGUINAL HERNIA REPAIR;  Surgeon: Mickeal Skinner, MD;  [facility];  Service: General;  Laterality: Right;   INSERTION OF MESH Right 10/22/2015   Procedure: INSERTION OF MESH;  Surgeon: Mickeal Skinner, MD;  Location: [place];  Service: General;  Laterality: Right;   TRANSTHORACIC ECHOCARDIOGRAM  02/21/2021   EF  60-65%, all normal except ascending aorta 42 mm.  CT chest 04/29/21 showed same measurement.   TRANSURETHRAL RESECTION OF PROSTATE N/A 08/25/2012   Procedure: TRANSURETHRAL RESECTION OF THE PROSTATE WITH GYRUS INSTRUMENTS;  Surgeon: Alexis Frock, MD;  Location[facility];  Service: Urology;  Laterality: N/A;--patient got excellent results from procedure.    Outpatient Medications Prior to Visit  Medication Sig Dispense Refill   aspirin EC 81 MG tablet Take 81 mg by mouth daily.     B Complex-Folic Acid (B COMPLEX-VITAMIN B12 PO) Take 1 capsule by mouth daily. Unknown strenght     Calcium-Magnesium-Zinc 167-83-8 MG TABS Take 1 tablet by mouth 2 (two) times daily after a meal.     cholecalciferol (VITAMIN D) 1000 units tablet Take 1,000 Units by mouth daily.     fluorouracil (EFUDEX) 5 % cream Apply 1 application topically daily as needed (rash).  0   losartan (COZAAR) 25 MG tablet Take 1 tablet (25 mg total) by mouth daily. 90 tablet 0   Olopatadine HCl 0.6 % SOLN Place 2 sprays into both nostrils 2 (two) times daily.     psyllium (METAMUCIL) 58.6 % powder Take 1 packet by mouth 2 (two) times daily.      senna (SENOKOT) 8.6 MG tablet Take 2 tablets by mouth 2 (two) times daily as needed for constipation.     SYMBICORT 80-4.5 MCG/ACT inhaler Inhale 2 puffs into the lungs daily.     tadalafil (CIALIS) 10 MG tablet 1-2 tabs po qd prn (Patient taking differently: Take 10 mg by mouth daily as needed for erectile dysfunction. 1-2 tabs po qd prn) 10 tablet 6   vitamin C (ASCORBIC ACID) 500 MG tablet Take 500 mg by mouth daily.     albuterol (VENTOLIN HFA) 108 (90 Base) MCG/ACT inhaler INHALE 2 PUFFS INTO THE LUNGS EVERY 4 HOURS AS NEEDED FOR SHORTNESS OF BREATH (Patient not taking: Reported on 10/17/2021) 18 g 1   ePHEDrine HCl (PRIMATENE) 12.5 MG TABS Take 12.5 mg by mouth as needed (SOB, Wheezing). (Patient not taking: Reported on 03/24/2022)     EPINEPHrine 0.3 mg/0.3 mL IJ SOAJ injection INJECT INTO  OUTER THIGH AS NEEDED FOR ANAPHYLAXIS (Patient not taking: Reported on 10/17/2021) 1 each 2   amoxicillin-clavulanate (AUGMENTIN) 875-125 MG tablet Take 1 tablet by mouth 2 (two) times daily. (Patient not taking: Reported on 03/24/2022) 20 tablet 0   No facility-administered medications prior to visit.    Allergies  Allergen Reactions   Molds & Smuts Other (See Comments)    ROS As per HPI  PE:    03/24/2022   11:05 AM 12/02/2021    4:29 PM 10/17/2021    1:16 PM  Vitals with BMI  Height '5\' 10"'  '5\' 10"'  '5\' 10"'   Weight 228 lbs 229 lbs 10 oz 226 lbs 10 oz  BMI 32.71 35.59 74.16  Systolic 384 536 468  Diastolic 73  71 74  Pulse 77 77 83     Physical Exam  VS: noted--normal. Gen: alert, NAD, NONTOXIC APPEARING. HEENT: eyes without injection, drainage, or swelling.  Ears: EACs clear, TMs with normal light reflex and landmarks.  Nose: Septum deviating quite a bit to the right.  Clear rhinorrhea, with some dried, crusty exudate adherent to mildly injected mucosa.  No purulent d/c.  No paranasal sinus TTP.  No facial swelling.  Throat and mouth without focal lesion.  No pharyngial swelling, erythema, or exudate.   Neck: supple, no LAD.   LUNGS: CTA bilat, nonlabored resps.   CV: RRR, no m/r/g. EXT: no c/c/e SKIN: no rash   LABS:  Last CBC Lab Results  Component Value Date   WBC 4.0 04/26/2021   HGB 14.3 04/26/2021   HCT 43.3 04/26/2021   MCV 94.2 04/26/2021   MCH 30.3 09/11/2012   RDW 13.8 04/26/2021   PLT 240.0 54/56/2563   Last metabolic panel Lab Results  Component Value Date   GLUCOSE 104 (H) 07/31/2021   NA 141 07/31/2021   K 4.4 07/31/2021   CL 104 07/31/2021   CO2 28 07/31/2021   BUN 14 07/31/2021   CREATININE 1.06 07/31/2021   EGFR 86 04/18/2021   CALCIUM 9.7 07/31/2021   PROT 7.0 04/26/2021   ALBUMIN 4.3 04/26/2021   BILITOT 0.6 04/26/2021   ALKPHOS 66 04/26/2021   AST 16 04/26/2021   ALT 23 04/26/2021   Lab Results  Component Value Date   TSH 1.71  04/26/2021   IMPRESSION AND PLAN:  #1 recurrent acute sinusitis, chronic allergic rhinitis. Augmentin helped in the past.  Will do 14-day course. He may have to revisit the idea of nasal/sinus surgery in the future.  He is currently between jobs and certainly surgery is not a choice he wants to make for the time being. Continue daily antihistamine, nasal steroid, and sinus rinses.  He is currently in the phase of cutting back/weaning off his allergy immunotherapy shots.  #2 chronic fatigue.  Decreased libido. He describes this specifically as a decrease in his vitality. Check vitamin B12 and testosterone levels today.  An After Visit Summary was printed and given to the patient.  FOLLOW UP: Return if symptoms worsen or fail to improve.  Signed:  Crissie Sickles, MD           03/24/2022

## 2022-03-25 ENCOUNTER — Telehealth: Payer: Self-pay

## 2022-03-25 DIAGNOSIS — R6882 Decreased libido: Secondary | ICD-10-CM

## 2022-03-25 NOTE — Telephone Encounter (Signed)
-----   Message from Tammi Sou, MD sent at 03/25/2022  8:36 AM EDT ----- Vitamin B12 level significantly low. I recommend he start taking over-the-counter vitamin B12 1000 mcg sublingual tab once a day.  Make sure is the sublingual and not the kind to swallow--> sublingual is absorbed much better.  We can recheck vitamin B12 level in about 3 months.  Also, testosterone is borderline low.  The protocol for this is to repeat the blood test to verify, and the specimen needs to be drawn as early in the morning as possible and needs to be done fasting.  No rush on this lab appointment.  Total testosterone, diagnosis decreased libido.

## 2022-03-26 ENCOUNTER — Other Ambulatory Visit (INDEPENDENT_AMBULATORY_CARE_PROVIDER_SITE_OTHER): Payer: BC Managed Care – PPO

## 2022-03-26 DIAGNOSIS — R6882 Decreased libido: Secondary | ICD-10-CM

## 2022-03-26 LAB — TESTOSTERONE: Testosterone: 356.12 ng/dL (ref 300.00–890.00)

## 2022-03-27 ENCOUNTER — Telehealth: Payer: Self-pay

## 2022-03-27 ENCOUNTER — Other Ambulatory Visit (INDEPENDENT_AMBULATORY_CARE_PROVIDER_SITE_OTHER): Payer: BC Managed Care – PPO

## 2022-03-27 DIAGNOSIS — E291 Testicular hypofunction: Secondary | ICD-10-CM

## 2022-03-27 LAB — LUTEINIZING HORMONE: LH: 1.81 m[IU]/mL (ref 1.50–9.30)

## 2022-03-27 NOTE — Telephone Encounter (Signed)
-----   Message from Tammi Sou, MD sent at 03/27/2022  8:04 AM EDT ----- Can LH and prolactin level be added?  Diagnosis male hypogonadism

## 2022-03-28 LAB — PROLACTIN: Prolactin: 5.2 ng/mL (ref 2.0–18.0)

## 2022-04-14 NOTE — Progress Notes (Signed)
NEUROLOGY FOLLOW UP OFFICE NOTE  Brian Walsh [id number]  Assessment/Plan:   Multiple sclerosis Memory deficits Fatigue B12 deficiency  I think his symptoms can be explained by the B12 deficiency.  Also emotional stress may have been a factor as well.  Low suspicion for dementia or related to his MS, which has been stable for years. He will continue B12 supplementation Follow up in 6 months.  If no significant improvement, may want to consider getting CPAP settings rechecked.    Subjective:  Brian Walsh is a 61 year old right-handed male with asthma, PUD, BPH and OSA who follows up for multiple sclerosis.  He is accompanied by his wife who supplements history.   UPDATE: Last seen on 03/23/2020.  At that time, his condition was stable.  He reports a stressful year.  He changed jobs but then returned to his previous work because of stress level.  He started having short term memory difficulty.  He usually could remember everything in his head but has started to right things down.  He also began feeling more fatigued.  He has overall cut back on his work which has greatly improved his stress, fatigue, and memory problems.  He also increased sleep from 4-5 hours to 7 hours a night.  He is exercising regularly and eating healthy.  He has longstanding history of OSA for which he has used a CPAP for 17 years.  He feels rested overall.  His PCP check labs last month and he was found to have a low B12 level of 142.  He started OTC supplements (1027mg daily).  No family history of dementia.   HISTORY: In 1992, he developed onset of left lower extremity weakness that caused difficulty with gait and balance.  He was diagnosed with multiple sclerosis via brain MRI (multiple pericollosal lesions noted) and CSF analysis negative for oligoclonal bands but positive for elevated IgG index.  He was treated with IV SoluMedrol at that time.  He had two relapses in [place] presenting as blurred vision  and incoordination, which also required treatment with SoluMedrol.  He has not had a relapse since 1996.  He has never been on a disease modifying drug.  He takes 2000 IU D3 daily, as well as calcium and zinc.  His last MRI was over 10 years ago.     He tries to lead a healthy lifestyle with less red meat and more fish and green vegetables.  He exercises, usually early in the morning at a gym so to prevent from becoming overheated.  He has been under a lot of stress.  His wife has stage 4 colon cancer.  His son has epilepsy.  Work can be stressful.  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 Restart immunotherapy w/Dr. Whelan winter 2020 (cat/dog dander, pollens).   Asthma    well controlled since allergies under control with immunotherapy, has albut   Blood transfusion without reported diagnosis    BPH (benign prostatic hypertrophy) 2014   TURP   Chronic constipation    Chronic sinusitis    Deviated nasal septum: >90% airway obstruction on right.  Dr. SWilburn Corneliadiscussed possible septoplasty and turbinate reduction with pt in 2015.   Constipation, chronic 11/24/2011   H/O blood clots    in eye    H/O nonmelanoma skin cancer    History of  adenomatous polyp of colon    Most recent colonoscopy 07/2017---recall 5 yrs.   Idiopathic urticaria    IFG (impaired fasting glucose) 2019; 2021   A1c 5.8% 2019 and 2021.   Lower extremity venous stasis    OSA on CPAP 2005   cpap nightly   Personal history of colonic polyps 03/24/2012   03/2012 - 5 mm cecal adenoma   PUD (peptic ulcer disease)    Bleeding ulcer 1978   Relapsing remitting multiple sclerosis ([place]) 1992   Remission since 1996 on no meds.  Initial presentation was gait/balance and leg weakness sx's.  Additional relapses with blurry vision and uncoordination.  Dr. Tomi Likens 04/2017 recommended re-estab  baseline MRI brain and C-spine + start disease modifying therapy but pt declined.   Thyroid nodule 05/2012   Right lobe 54mX14mm nodule found on screening thyroid and carotid u/s done through his employer.  Pt was euthyroid at that time.  A radioactive iodine uptake and scan was normal 06/2012.  FNA bx 04/2015 was BENIGN/CYST.    MEDICATIONS: Current Outpatient Medications on File Prior to Visit  Medication Sig Dispense Refill   albuterol (VENTOLIN HFA) 108 (90 Base) MCG/ACT inhaler INHALE 2 PUFFS INTO THE LUNGS EVERY 4 HOURS AS NEEDED FOR SHORTNESS OF BREATH (Patient not taking: Reported on 10/17/2021) 18 g 1   amoxicillin-clavulanate (AUGMENTIN) 875-125 MG tablet Take 1 tablet by mouth 2 (two) times daily. 28 tablet 0   aspirin EC 81 MG tablet Take 81 mg by mouth daily.     B Complex-Folic Acid (B COMPLEX-VITAMIN B12 PO) Take 1 capsule by mouth daily. Unknown strenght     Calcium-Magnesium-Zinc 167-83-8 MG TABS Take 1 tablet by mouth 2 (two) times daily after a meal.     cholecalciferol (VITAMIN D) 1000 units tablet Take 1,000 Units by mouth daily.     ePHEDrine HCl (PRIMATENE) 12.5 MG TABS Take 12.5 mg by mouth as needed (SOB, Wheezing). (Patient not taking: Reported on 03/24/2022)     EPINEPHrine 0.3 mg/0.3 mL IJ SOAJ injection INJECT INTO OUTER THIGH AS NEEDED FOR ANAPHYLAXIS (Patient not taking: Reported on 10/17/2021) 1 each 2   fluorouracil (EFUDEX) 5 % cream Apply 1 application topically daily as needed (rash).  0   losartan (COZAAR) 25 MG tablet Take 1 tablet (25 mg total) by mouth daily. 90 tablet 0   Olopatadine HCl 0.6 % SOLN Place 2 sprays into both nostrils 2 (two) times daily.     psyllium (METAMUCIL) 58.6 % powder Take 1 packet by mouth 2 (two) times daily.      senna (SENOKOT) 8.6 MG tablet Take 2 tablets by mouth 2 (two) times daily as needed for constipation.     SYMBICORT 80-4.5 MCG/ACT inhaler Inhale 2 puffs into the lungs daily.     tadalafil (CIALIS) 10 MG tablet 1-2 tabs po  qd prn (Patient taking differently: Take 10 mg by mouth daily as needed for erectile dysfunction. 1-2 tabs po qd prn) 10 tablet 6   vitamin C (ASCORBIC ACID) 500 MG tablet Take 500 mg by mouth daily.     No current facility-administered medications on file prior to visit.    ALLERGIES: Allergies  Allergen Reactions   Molds & Smuts Other (See Comments)    FAMILY HISTORY: Family History  Problem Relation Age of Onset   Parkinsonism Father    Stroke Father    Colon polyps Father 553  Breast cancer Mother    Colon cancer Neg Hx  Esophageal cancer Neg Hx    Rectal cancer Neg Hx    Stomach cancer Neg Hx       Objective:  Blood pressure 135/86, pulse 81, weight 229 lb (103.9 kg), SpO2 98 %. General: No acute distress.  Patient appears well-groomed.   Head:  Normocephalic/atraumatic Eyes:  Fundi examined but not visualized Neck: supple, no paraspinal tenderness, full range of motion Heart:  Regular rate and rhythm Back: No paraspinal tenderness Neurological Exam: alert and oriented to person, place, and time.  Speech fluent and not dysarthric, language intact.  CN II-XII intact. Bulk and tone normal, muscle strength 5/5 throughout.  Sensation to pinprick and vibration intact.  Deep tendon reflexes 2+ throughout, toes downgoing.  Finger to nose testing intact.  Gait normal, Romberg negative.   Metta Clines, DO  CC: [name], MD

## 2022-04-15 ENCOUNTER — Encounter: Payer: Self-pay | Admitting: Neurology

## 2022-04-15 ENCOUNTER — Ambulatory Visit: Payer: BC Managed Care – PPO | Admitting: Neurology

## 2022-04-15 VITALS — BP 135/86 | HR 81 | Wt 229.0 lb

## 2022-04-15 DIAGNOSIS — R5383 Other fatigue: Secondary | ICD-10-CM

## 2022-04-15 DIAGNOSIS — G35 Multiple sclerosis: Secondary | ICD-10-CM | POA: Diagnosis not present

## 2022-04-15 DIAGNOSIS — R413 Other amnesia: Secondary | ICD-10-CM

## 2022-04-15 DIAGNOSIS — E538 Deficiency of other specified B group vitamins: Secondary | ICD-10-CM | POA: Diagnosis not present

## 2022-04-21 ENCOUNTER — Ambulatory Visit: Payer: BC Managed Care – PPO

## 2022-04-21 ENCOUNTER — Ambulatory Visit (INDEPENDENT_AMBULATORY_CARE_PROVIDER_SITE_OTHER): Payer: BC Managed Care – PPO

## 2022-04-21 DIAGNOSIS — Z23 Encounter for immunization: Secondary | ICD-10-CM

## 2022-05-03 ENCOUNTER — Other Ambulatory Visit: Payer: Self-pay | Admitting: Cardiology

## 2022-05-03 DIAGNOSIS — I878 Other specified disorders of veins: Secondary | ICD-10-CM

## 2022-06-08 ENCOUNTER — Other Ambulatory Visit: Payer: Self-pay | Admitting: Cardiology

## 2022-06-08 DIAGNOSIS — I878 Other specified disorders of veins: Secondary | ICD-10-CM

## 2022-06-09 NOTE — Telephone Encounter (Signed)
Rx refill sent to pharmacy. 

## 2022-06-25 ENCOUNTER — Encounter: Payer: Self-pay | Admitting: Neurology

## 2022-07-07 ENCOUNTER — Other Ambulatory Visit: Payer: Self-pay | Admitting: Cardiology

## 2022-07-07 DIAGNOSIS — I878 Other specified disorders of veins: Secondary | ICD-10-CM

## 2022-07-22 ENCOUNTER — Other Ambulatory Visit: Payer: Self-pay | Admitting: Cardiology

## 2022-07-22 DIAGNOSIS — I878 Other specified disorders of veins: Secondary | ICD-10-CM

## 2022-07-30 ENCOUNTER — Ambulatory Visit: Payer: BC Managed Care – PPO | Attending: Cardiology | Admitting: Cardiology

## 2022-07-30 ENCOUNTER — Encounter: Payer: Self-pay | Admitting: Cardiology

## 2022-07-30 VITALS — BP 122/82 | HR 65 | Ht 70.0 in | Wt 236.0 lb

## 2022-07-30 DIAGNOSIS — G35 Multiple sclerosis: Secondary | ICD-10-CM

## 2022-07-30 DIAGNOSIS — I878 Other specified disorders of veins: Secondary | ICD-10-CM

## 2022-07-30 DIAGNOSIS — I10 Essential (primary) hypertension: Secondary | ICD-10-CM | POA: Diagnosis not present

## 2022-07-30 DIAGNOSIS — G4733 Obstructive sleep apnea (adult) (pediatric): Secondary | ICD-10-CM | POA: Diagnosis not present

## 2022-07-30 DIAGNOSIS — E785 Hyperlipidemia, unspecified: Secondary | ICD-10-CM

## 2022-07-30 DIAGNOSIS — I77810 Thoracic aortic ectasia: Secondary | ICD-10-CM

## 2022-07-30 NOTE — Patient Instructions (Addendum)
Medication Instructions:  Your physician recommends that you continue on your current medications as directed. Please refer to the Current Medication list given to you today.  *If you need a refill on your cardiac medications before your next appointment, please call your pharmacy*   Lab Work: [place] recommends that you return for lab work in:  when you are fasting  You need to have labs done when you are fasting.  You can come Monday through Friday 8:00 am to 12:00 pm and 1:00 to 4:30. You do not need to make an appointment as the order has already been placed. The labs you are going to have done are Lipid panel.    Testing/Procedures: Non-Cardiac CT scanning, (CAT scanning), is a noninvasive, special x-ray that produces cross-sectional images of the body using x-rays and a computer. CT scans help physicians diagnose and treat medical conditions. For some CT exams, a contrast material is used to enhance visibility in the area of the body being studied. CT scans provide greater clarity and reveal more details than regular x-ray exams.    Follow-Up: At [facility], you and your health needs are our priority.  As part of our continuing mission to provide you with exceptional heart care, we have created designated Provider Care Teams.  These Care Teams include your primary Cardiologist (physician) and Advanced Practice Providers (APPs -  Physician Assistants and Nurse Practitioners) who all work together to provide you with the care you need, when you need it.  We recommend signing up for the patient portal called "MyChart".  Sign up information is provided on this After Visit Summary.  MyChart is used to connect with patients for Virtual Visits (Telemedicine).  Patients are able to view lab/test results, encounter notes, upcoming appointments, etc.  Non-urgent messages can be sent to your provider as well.   To learn more about what you can do with MyChart, go  to [email or web address].    Your next appointment:   12 month(s)  The format for your next appointment:   In Person  Provider:   Jenne Campus, MD    Other Instructions NA

## 2022-07-30 NOTE — Addendum Note (Signed)
Addended by: Jacobo Forest D on: 07/30/2022 11:55 AM   Modules accepted: Orders

## 2022-07-30 NOTE — Addendum Note (Signed)
Addended by: Jacobo Forest D on: 07/30/2022 10:05 AM   Modules accepted: Orders

## 2022-07-30 NOTE — Progress Notes (Signed)
Cardiology Office Note:    Date:  07/30/2022   ID:  Brian Walsh, DOB September 04, 1960, MRN [id number]  PCP:  Brian Sou, MD  Cardiologist:  Brian Campus, MD    Referring MD: Brian Sou, MD   Chief Complaint  Patient presents with   Medication Management    History of Present Illness:    Brian Walsh is a 62 y.o. male with past medical history significant for enlargement of ascending aorta, dyslipidemia, he also has multiple sclerosis likely since 1995 there is no issues.  He comes today 2 months for follow-up overall cardiac wise doing well.  He goes to gym on the regular basis in the morning 5:00.  Denies have any chest pain tightness squeezing pressure burning chest.  Past Medical History:  Diagnosis Date   Allergic rhinitis    Much improved with allergy immunotherapy. Restart immunotherapy w/Dr. Whelan winter 2020 (cat/dog dander, pollens).   Asthma    well controlled since allergies under control with immunotherapy, has albut   Blood transfusion without reported diagnosis    BPH (benign prostatic hypertrophy) 2014   TURP   Chronic constipation    Chronic sinusitis    Deviated nasal septum: >90% airway obstruction on right.  Dr. Wilburn Brian Walsh discussed possible septoplasty and turbinate reduction with pt in 2015.   Constipation, chronic 11/24/2011   H/O blood clots    in eye    H/O nonmelanoma skin cancer    History of adenomatous polyp of colon    Most recent colonoscopy 07/2017---recall 5 yrs.   Idiopathic urticaria    IFG (impaired fasting glucose) 2019; 2021   A1c 5.8% 2019 and 2021.   Lower extremity venous stasis    OSA on CPAP 2005   cpap nightly   Personal history of colonic polyps 03/24/2012   03/2012 - 5 mm cecal adenoma   PUD (peptic ulcer disease)    Bleeding ulcer 1978   Relapsing remitting multiple sclerosis ([place]) 1992   Remission since 1996 on no meds.  Initial presentation was gait/balance and leg weakness sx's.  Additional relapses with  blurry vision and uncoordination.  Dr. Tomi Walsh 04/2017 recommended re-estab baseline MRI brain and C-spine + start disease modifying therapy but pt declined.   Thyroid nodule 05/2012   Right lobe 38mX14mm nodule found on screening thyroid and carotid u/s done through his employer.  Pt was euthyroid at that time.  A radioactive iodine uptake and scan was normal 06/2012.  FNA bx 04/2015 was BENIGN/CYST.    Past Surgical History:  Procedure Laterality Date   APPENDECTOMY  1986   COLONOSCOPY  2006, 2013; 07/2017   diminutive hyperplastic cecal polyp ([place]); diminutive cecal adenoma removed 2013.  Repeat 07/24/17, one polyp--recall 5 yrs.   FEMUR FRACTURE SURGERY  1978   with rod insertion.  Rod removal 1979   FNA R Thyroid nodule  04/17/2015   Benign/cystic (Dr. [place]   Procedure: LAPAROSCOPIC RIGHT INGUINAL HERNIA REPAIR;  Surgeon: Brian Skinner MD;  [facility]  Service: General;  Laterality: Right;   INSERTION OF MESH Right 10/22/2015   Procedure: INSERTION OF MESH;  Surgeon: Brian Skinner MD;  [facility]  Service: General;  Laterality: Right;   TRANSTHORACIC ECHOCARDIOGRAM  02/21/2021   EF 60-65%, all normal except ascending aorta 42 mm.  CT chest 04/29/21 showed same measurement.   TRANSURETHRAL RESECTION OF PROSTATE N/A 08/25/2012   Procedure: TRANSURETHRAL RESECTION OF  THE PROSTATE WITH GYRUS INSTRUMENTS;  Surgeon: Brian Frock, MD;  Location: [facility];  Service: Urology;  Laterality: N/A;--patient got excellent results from procedure.    Current Medications: Current Meds  Medication Sig   albuterol (VENTOLIN HFA) 108 (90 Base) MCG/ACT inhaler INHALE 2 PUFFS INTO THE LUNGS EVERY 4 HOURS AS NEEDED FOR SHORTNESS OF BREATH (Patient taking differently: Inhale 2 puffs into the lungs every 4 (four) hours as needed for wheezing or shortness of breath.)   amoxicillin-clavulanate  (AUGMENTIN) 875-125 MG tablet Take 1 tablet by mouth 2 (two) times daily.   aspirin EC 81 MG tablet Take 81 mg by mouth daily.   B Complex-Folic Acid (B COMPLEX-VITAMIN B12 PO) Take 1 capsule by mouth daily. Unknown strenght   Calcium-Magnesium-Zinc 167-83-8 MG TABS Take 1 tablet by mouth 2 (two) times daily after a meal.   cholecalciferol (VITAMIN D) 1000 units tablet Take 1,000 Units by mouth daily.   ePHEDrine HCl (PRIMATENE) 12.5 MG TABS Take 12.5 mg by mouth as needed (SOB, Wheezing).   EPINEPHrine 0.3 mg/0.3 mL IJ SOAJ injection INJECT INTO OUTER THIGH AS NEEDED FOR ANAPHYLAXIS (Patient taking differently: Inject 0.3 mg into the muscle as needed for anaphylaxis. INJECT INTO OUTER THIGH AS NEEDED FOR ANAPHYLAXIS)   fluorouracil (EFUDEX) 5 % cream Apply 1 application topically daily as needed (rash).   losartan (COZAAR) 25 MG tablet Take 1 tablet (25 mg total) by mouth daily. Patient needs an appointment for further refills. 3 rd/final attempt   Olopatadine HCl 0.6 % SOLN Place 2 sprays into both nostrils 2 (two) times daily.   psyllium (METAMUCIL) 58.6 % powder Take 1 packet by mouth 2 (two) times daily.    senna (SENOKOT) 8.6 MG tablet Take 2 tablets by mouth 2 (two) times daily as needed for constipation.   SYMBICORT 80-4.5 MCG/ACT inhaler Inhale 2 puffs into the lungs daily.   tadalafil (CIALIS) 10 MG tablet 1-2 tabs po qd prn (Patient taking differently: Take 10 mg by mouth daily as needed for erectile dysfunction. 1-2 tabs po qd prn)   vitamin C (ASCORBIC ACID) 500 MG tablet Take 500 mg by mouth daily.     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   JD and MBA from [place].   No tobacco, 3-4 glasses of red wine per week, no drug use.   No exercise.   [place], plays tennis, works out at gym 5 days a week,. He has a law degree-04/21/17-sjb   Right handed  2 story home   Caffeine intake yes   Social Determinants of Health   Financial Resource Strain: Low Risk  (07/28/2021)   Overall Financial Resource Strain (CARDIA)    Difficulty of Paying Living Expenses: Not hard at all  Food Insecurity: No Food Insecurity (07/28/2021)   Hunger Vital Sign    Worried About Running Out of Food in the Last Year: Never true    Ran Out of Food in the Last Year: Never true  Transportation Needs: No Transportation Needs (07/28/2021)   PRAPARE - Hydrologist (Medical): No    Lack of Transportation (Non-Medical): No  Physical Activity: Sufficiently Active (07/28/2021)   Exercise Vital Sign    Days of Exercise per Week: 7 days    Minutes of Exercise per Session: 120 min  Stress: Stress Concern Present (07/28/2021)   [place]    Feeling of Stress : To some extent  Social Connections: Moderately Integrated (07/28/2021)   Social Connection and Isolation Panel [NHANES]    Frequency of Communication with Friends and Family: Once a week    Frequency of Social Gatherings with Friends and Family: Once a week    Attends Religious Services: More than 4 times per year    Active Member of [employer] or Organizations:  Yes    Attends Music therapist: More than 4 times per year    Marital Status: Married     Family History: The patient's family history includes Breast cancer in his mother; Colon polyps (age of onset: 12) in his father; Parkinsonism in his father; Stroke in his father. There is no history of Colon cancer, Esophageal cancer, Rectal cancer, or Stomach cancer. ROS:   Please see the history of present illness.    All 14 point review of systems negative except as described per history of present illness  EKGs/Labs/Other Studies Reviewed:      Recent Labs: 07/31/2021: BUN 14; Creatinine, Ser 1.06; Potassium 4.4; Sodium 141  Recent Lipid Panel    Component Value Date/Time   CHOL 163 04/26/2021 0913   TRIG 104.0 04/26/2021 0913   HDL 58.30 04/26/2021 0913   CHOLHDL 3 04/26/2021 0913   VLDL 20.8 04/26/2021 0913   LDLCALC 84 04/26/2021 0913    Physical Exam:    VS:  BP 122/82 (BP Location: Left Arm, Patient Position: Sitting)   Pulse 65   Ht '5\' 10"'$  (1.778 m)   Wt 236 lb (107 kg)   SpO2 99%   BMI 33.86 kg/m     Wt Readings from Last 3 Encounters:  07/30/22 236 lb (107 kg)  04/15/22 229 lb (103.9 kg)  03/24/22 228 lb (103.4 kg)     GEN:  Well nourished, well developed in no acute distress HEENT: Normal NECK: No JVD; No carotid bruits LYMPHATICS: No lymphadenopathy CARDIAC: RRR, no murmurs, no rubs, no gallops RESPIRATORY:  Clear to auscultation without rales, wheezing or rhonchi  ABDOMEN: Soft, non-tender, non-distended MUSCULOSKELETAL:  No edema; No deformity  SKIN: Warm and dry LOWER EXTREMITIES: no swelling NEUROLOGIC:  Alert and oriented x 3 PSYCHIATRIC:  Normal affect   ASSESSMENT:    1. Ascending aorta dilatation (HCC) 42 mm based on echocardiogram from 2022   2. Essential hypertension   3. Lower extremity venous stasis   4. OSA on CPAP   5. Multiple sclerosis ([place])    PLAN:    In order of problems listed above:  Ascending [employer]  dilatation  measuring 42 mm based on echocardiogram from 2022, he did have CT of his chest which showed presence of enlargement only in the ascending aorta, abdominal ultrasound has been performed to look at his abdominal artery which did not show any enlargement.  I was thinking about doing echocardiogram however his CT done a year ago shows some changes in his lung parenchyma.  Therefore, I will be better to do CT of his chest I do not think we need to do with contrast however CT of his chest need to be done to clarify changes in the parenchyma. Essential hypertension blood pressure well-controlled continue present management. Lower extremity stasis, only mild.  He is fine with that. Obstructive sleep apnea use CPAP mask on the regular basis. Multiple sclerosis, stable, followed by neurologist. Dyslipidemia I did review his K PN which show me LDL 84 HDL 58 this is from 04/26/2021.  Will recheck his fasting lipid profile   Medication Adjustments/Labs and Tests Ordered: Current medicines are reviewed at length with the patient today.  Concerns regarding medicines are outlined above.  No orders of the defined types were placed in this encounter.  Medication changes: No orders of the defined types were placed in this encounter.   Signed, Park Liter, MD, [facility] 07/30/2022 9:42 AM    [place]

## 2022-08-07 ENCOUNTER — Telehealth: Payer: Self-pay

## 2022-08-07 NOTE — Telephone Encounter (Signed)
Wife of patient, Brian Walsh [facility]) called asking about what is the process of patient getting a new CPAP machine.  She stated that his last sleep study was in [place] in 2005, and he got his current CPAP machine in 2017. I told Brian Walsh I would find out from Dr. Anitra Lauth and call her back.  Per Dr. Anitra Lauth patient can get CPAP machine prescribed by Pulmonary provider. He may have to repeat sleep study also.  Spoke to [facility] about Dr. Idelle Leech recommendations.  Patient does not have pulmonary doctor but he sees Dr, Brian Walsh, Neurologist.  Brian Walsh is to check with him to see if he could order CPAP and order sleep study.   Brian Walsh is to call me back if Dr. Tomi Walsh is unable to do so and I will send note for Dr. Anitra Lauth to refer patient to pulmonary provider.

## 2022-08-10 ENCOUNTER — Encounter: Payer: Self-pay | Admitting: Neurology

## 2022-08-10 ENCOUNTER — Other Ambulatory Visit: Payer: Self-pay | Admitting: Cardiology

## 2022-08-10 DIAGNOSIS — I878 Other specified disorders of veins: Secondary | ICD-10-CM

## 2022-08-11 ENCOUNTER — Other Ambulatory Visit: Payer: Self-pay

## 2022-08-11 DIAGNOSIS — G4733 Obstructive sleep apnea (adult) (pediatric): Secondary | ICD-10-CM

## 2022-08-11 NOTE — Progress Notes (Signed)
Per Dr.Jaffe, Okay to refer for new C-Pap  machine.

## 2022-08-16 ENCOUNTER — Ambulatory Visit (HOSPITAL_BASED_OUTPATIENT_CLINIC_OR_DEPARTMENT_OTHER)
Admission: RE | Admit: 2022-08-16 | Discharge: 2022-08-16 | Disposition: A | Discharge status: Home / Self Care | Payer: BC Managed Care – PPO | Source: Ambulatory Visit | Attending: Cardiology | Admitting: Cardiology

## 2022-08-16 DIAGNOSIS — I77810 Thoracic aortic ectasia: Secondary | ICD-10-CM | POA: Diagnosis not present

## 2022-08-16 DIAGNOSIS — R911 Solitary pulmonary nodule: Secondary | ICD-10-CM | POA: Diagnosis not present

## 2022-08-21 ENCOUNTER — Telehealth: Payer: Self-pay

## 2022-08-21 NOTE — Telephone Encounter (Signed)
Results reviewed with pt as per Dr. Krasowski's note.  Pt verbalized understanding and had no additional questions. Routed to PCP  

## 2022-08-25 ENCOUNTER — Encounter: Payer: Self-pay | Admitting: Adult Health

## 2022-08-25 ENCOUNTER — Ambulatory Visit (INDEPENDENT_AMBULATORY_CARE_PROVIDER_SITE_OTHER): Payer: BC Managed Care – PPO | Admitting: Adult Health

## 2022-08-25 VITALS — BP 122/74 | HR 80 | Temp 98.2°F | Ht 70.0 in | Wt 235.6 lb

## 2022-08-25 DIAGNOSIS — G4733 Obstructive sleep apnea (adult) (pediatric): Secondary | ICD-10-CM | POA: Diagnosis not present

## 2022-08-25 NOTE — Assessment & Plan Note (Signed)
Excellent control and compliance on nocturnal CPAP.  Patient had longstanding sleep apnea since 2005.  Originally diagnosed in [place].  Patient has a copy of his sleep study that he will email to our office through [place].  Would continue on CPAP 9 cm H2O.  He is currently using nasal pillows.  Order for new supplies has been sent as well.  Patient education given on sleep apnea.  Patient has excellent compliance and control on current settings.  Plan  Patient Instructions  Continue on CPAP at bedtime Keep up the good work Do not drive if sleepy Work on healthy weight Order for new CPAP machine and supplies Follow-up in 1 year and as needed

## 2022-08-25 NOTE — Addendum Note (Signed)
Addended byOralia Rud M on: 08/25/2022 04:50 PM   Modules accepted: Orders

## 2022-08-25 NOTE — Progress Notes (Signed)
$@PatientM$  ID: Brian Walsh, male    DOB: 07-13-1961, 62 y.o.   MRN: [id number]  Chief Complaint  Patient presents with   Consult    Referring provider: Pieter Partridge, DO  HPI: 62 year old male seen for sleep consult August 25, 2022 to establish for sleep apnea  TEST/EVENTS :   08/25/2022 Sleep consult  Patient presents for sleep consult today.  Kindly referred by Dr. Patient is here to establish for sleep apnea.  Says he has had longstanding sleep apnea was diagnosed in 2005.  He lived in [place] when he was originally diagnosed with severe OSA  with AHI was 32/hr. Has copy of sleep study at home. Will mail it to [place].  Patient says he has been on CPAP since then.  Wears his CPAP every single night cannot sleep without it.  Patient says he typically goes to bed about 10:30 PM.  Only takes a couple minutes to go to sleep.  Gets up at 5:30 AM.  CPAP download shows excellent compliance with 100% usage.  Daily average usage at 7 hours.  Patient is on CPAP 9 cm H2O.  AHI 0.3/hour.  Positive mask leaks.  No symptoms suspicious for cataplexy or sleep paralysis.  Epworth score is 4 out of 24.  Typically gets sleepy in the evening hours after he eats.  Wears nasal pillows.  CPAP machine is around 62 years old. Supplies are getting old.  No sleep aides. Does not nap.  Caffeine -4 cups of coffee today. No history of CHF or CVA.  No symptoms of cataplexy or sleep paralysis.   Medical history significant for hypertension, asthma, chronic allergies, sleep apnea, MS.  MS was diagnosed in 1992.  He is followed by neurology.  Not on any maintenance regimen.  Surgical history positive for left femur surgery age 29.  And appendectomy in 1986.  Social history patient is married.  Has adult children.  Lives at home with his wife.  Has social alcohol intake.  No drug use.  No smoke.  Family history positive for asthma and cancer.   Allergies  Allergen Reactions   Molds & Smuts Other (See Comments)     Immunization History  Administered Date(s) Administered   Influenza Split 03/14/2012   Influenza, High Dose Seasonal PF 06/27/2021   Influenza,inj,Quad PF,6+ Mos 04/26/2021, 04/21/2022   Influenza-Unspecified 04/14/2015   Moderna SARS-COV2 Booster Vaccination 06/12/2020   Moderna Sars-Covid-2 Vaccination 09/19/2019, 10/19/2019   PNEUMOCOCCAL CONJUGATE-20 04/26/2021   Tdap 11/24/2011   Zoster Recombinat (Shingrix) 01/02/2017, 03/17/2017    Past Medical History:  Diagnosis Date   Allergic rhinitis    Much improved with allergy immunotherapy. Restart immunotherapy w/Dr. Whelan winter 2020 (cat/dog dander, pollens).   Asthma    well controlled since allergies under control with immunotherapy, has albut   Blood transfusion without reported diagnosis    BPH (benign prostatic hypertrophy) 2014   TURP   Chronic constipation    Chronic sinusitis    Deviated nasal septum: >90% airway obstruction on right.  Dr. Wilburn Cornelia discussed possible septoplasty and turbinate reduction with pt in 2015.   Constipation, chronic 11/24/2011   H/O blood clots    in eye    H/O nonmelanoma skin cancer    History of adenomatous polyp of colon    Most recent colonoscopy 07/2017---recall 5 yrs.   Idiopathic urticaria    IFG (impaired fasting glucose) 2019; 2021   A1c 5.8% 2019 and 2021.   Lower extremity venous stasis  OSA on CPAP 2005   cpap nightly   Personal history of colonic polyps 03/24/2012   03/2012 - 5 mm cecal adenoma   PUD (peptic ulcer disease)    Bleeding ulcer 1978   Relapsing remitting multiple sclerosis ([place]) 1992   Remission since 1996 on no meds.  Initial presentation was gait/balance and leg weakness sx's.  Additional relapses with blurry vision and uncoordination.  Dr. Tomi Likens 04/2017 recommended re-estab baseline MRI brain and C-spine + start disease modifying therapy but pt declined.   Thyroid nodule 05/2012   Right lobe 70mX14mm nodule found on screening thyroid and carotid  u/s done through his employer.  Pt was euthyroid at that time.  A radioactive iodine uptake and scan was normal 06/2012.  FNA bx 04/2015 was BENIGN/CYST.    Tobacco History: Social History   Tobacco Use  Smoking Status Never  Smokeless Tobacco Never   Counseling given: Not Answered   Outpatient Medications Prior to Visit  Medication Sig Dispense Refill   albuterol (VENTOLIN HFA) 108 (90 Base) MCG/ACT inhaler INHALE 2 PUFFS INTO THE LUNGS EVERY 4 HOURS AS NEEDED FOR SHORTNESS OF BREATH (Patient taking differently: Inhale 2 puffs into the lungs every 4 (four) hours as needed for wheezing or shortness of breath.) 18 g 1   aspirin EC 81 MG tablet Take 81 mg by mouth daily.     B Complex-Folic Acid (B COMPLEX-VITAMIN B12 PO) Take 1 capsule by mouth daily. Unknown strenght     Calcium-Magnesium-Zinc 167-83-8 MG TABS Take 1 tablet by mouth 2 (two) times daily after a meal.     cholecalciferol (VITAMIN D) 1000 units tablet Take 1,000 Units by mouth daily.     ePHEDrine HCl (PRIMATENE) 12.5 MG TABS Take 12.5 mg by mouth as needed (SOB, Wheezing).     EPINEPHrine 0.3 mg/0.3 mL IJ SOAJ injection INJECT INTO OUTER THIGH AS NEEDED FOR ANAPHYLAXIS (Patient taking differently: Inject 0.3 mg into the muscle as needed for anaphylaxis. INJECT INTO OUTER THIGH AS NEEDED FOR ANAPHYLAXIS) 1 each 2   losartan (COZAAR) 25 MG tablet Take 1 tablet (25 mg total) by mouth daily. 90 tablet 3   Olopatadine HCl 0.6 % SOLN Place 2 sprays into both nostrils 2 (two) times daily.     psyllium (METAMUCIL) 58.6 % powder Take 1 packet by mouth 2 (two) times daily.      senna (SENOKOT) 8.6 MG tablet Take 2 tablets by mouth 2 (two) times daily as needed for constipation.     SYMBICORT 80-4.5 MCG/ACT inhaler Inhale 2 puffs into the lungs daily.     tadalafil (CIALIS) 10 MG tablet 1-2 tabs po qd prn (Patient taking differently: Take 10 mg by mouth daily as needed for erectile dysfunction. 1-2 tabs po qd prn) 10 tablet 6    vitamin C (ASCORBIC ACID) 500 MG tablet Take 500 mg by mouth daily.     amoxicillin-clavulanate (AUGMENTIN) 875-125 MG tablet Take 1 tablet by mouth 2 (two) times daily. 28 tablet 0   fluorouracil (EFUDEX) 5 % cream Apply 1 application topically daily as needed (rash). (Patient not taking: Reported on 08/25/2022)  0   No facility-administered medications prior to visit.     Review of Systems:   Constitutional:   No  weight loss, night sweats,  Fevers, chills, fatigue, or  lassitude.  HEENT:   No headaches,  Difficulty swallowing,  Tooth/dental problems, or  Sore throat,  No sneezing, itching, ear ache, nasal congestion, post nasal drip,   CV:  No chest pain,  Orthopnea, PND, swelling in lower extremities, anasarca, dizziness, palpitations, syncope.   GI  No heartburn, indigestion, abdominal pain, nausea, vomiting, diarrhea, change in bowel habits, loss of appetite, bloody stools.   Resp: No shortness of breath with exertion or at rest.  No excess mucus, no productive cough,  No non-productive cough,  No coughing up of blood.  No change in color of mucus.  No wheezing.  No chest wall deformity  Skin: no rash or lesions.  GU: no dysuria, change in color of urine, no urgency or frequency.  No flank pain, no hematuria   MS:  No joint pain or swelling.  No decreased range of motion.  No back pain.    Physical Exam  BP 122/74 (BP Location: Left Arm, Patient Position: Sitting, Cuff Size: Large)   Pulse 80   Temp 98.2 F (36.8 C) (Oral)   Ht 5' 10"$  (1.778 m)   Wt 235 lb 9.6 oz (106.9 kg)   SpO2 97%   BMI 33.81 kg/m   GEN: A/Ox3; pleasant , NAD, well nourished    HEENT:  [place]/AT,   NOSE-clear, THROAT-clear, no lesions, no postnasal drip or exudate noted.  Class 2-3 MP airway   NECK:  Supple w/ fair ROM; no JVD; normal carotid impulses w/o bruits; no thyromegaly or nodules palpated; no lymphadenopathy.    RESP  Clear  P & A; w/o, wheezes/ rales/ or rhonchi. no  accessory muscle use, no dullness to percussion  CARD:  RRR, no m/r/g, no peripheral edema, pulses intact, no cyanosis or clubbing.  GI:   Soft & nt; nml bowel sounds; no organomegaly or masses detected.   Musco: Warm bil, no deformities or joint swelling noted.   Neuro: alert, no focal deficits noted.    Skin: Warm, no lesions or rashes    Lab Results:  CBC   BMET   BNP No results found for: "BNP"    Imaging:       No data to display          No results found for: "NITRICOXIDE"      Assessment & Plan:   OSA on CPAP Excellent control and compliance on nocturnal CPAP.  Patient had longstanding sleep apnea since 2005.  Originally diagnosed in [place].  Patient has a copy of his sleep study that he will email to our office through [facility].  Would continue on CPAP 9 cm H2O.  He is currently using nasal pillows.  Order for new supplies has been sent as well.  Patient education given on sleep apnea.  Patient has excellent compliance and control on current settings.  Plan  Patient Instructions  Continue on CPAP at bedtime Keep up the good work Do not drive if sleepy Work on healthy weight Order for new CPAP machine and supplies Follow-up in 1 year and as needed       [employer], NP 08/25/2022

## 2022-08-25 NOTE — Progress Notes (Signed)
Reviewed and agree with assessment/plan.   Chesley Mires, MD [facility] Pulmonary/Critical Care 08/25/2022, 5:42 PM Pager:  [phone]

## 2022-08-25 NOTE — Patient Instructions (Signed)
Continue on CPAP at bedtime Keep up the good work Do not drive if sleepy Work on [employer] Order for new CPAP machine and supplies Follow-up in 1 year and as needed

## 2022-09-01 ENCOUNTER — Encounter: Payer: Self-pay | Admitting: Family Medicine

## 2022-09-01 ENCOUNTER — Ambulatory Visit: Payer: BC Managed Care – PPO | Admitting: Family Medicine

## 2022-09-01 VITALS — BP 109/64 | HR 65 | Temp 98.1°F | Ht 70.0 in | Wt 232.4 lb

## 2022-09-01 DIAGNOSIS — R053 Chronic cough: Secondary | ICD-10-CM

## 2022-09-01 DIAGNOSIS — R6882 Decreased libido: Secondary | ICD-10-CM | POA: Diagnosis not present

## 2022-09-01 DIAGNOSIS — E538 Deficiency of other specified B group vitamins: Secondary | ICD-10-CM | POA: Diagnosis not present

## 2022-09-01 DIAGNOSIS — J4540 Moderate persistent asthma, uncomplicated: Secondary | ICD-10-CM

## 2022-09-01 DIAGNOSIS — R5382 Chronic fatigue, unspecified: Secondary | ICD-10-CM

## 2022-09-01 DIAGNOSIS — R310 Gross hematuria: Secondary | ICD-10-CM

## 2022-09-01 LAB — POCT URINALYSIS DIPSTICK
Bilirubin, UA: NEGATIVE
Blood, UA: NEGATIVE
Glucose, UA: NEGATIVE
Ketones, UA: NEGATIVE
Leukocytes, UA: NEGATIVE
Nitrite, UA: NEGATIVE
Protein, UA: POSITIVE — AB
Spec Grav, UA: 1.025 (ref 1.010–1.025)
Urobilinogen, UA: 0.2 E.U./dL
pH, UA: 6 (ref 5.0–8.0)

## 2022-09-01 LAB — CBC WITH DIFFERENTIAL/PLATELET
Basophils Absolute: 0 10*3/uL (ref 0.0–0.1)
Basophils Relative: 0.4 % (ref 0.0–3.0)
Eosinophils Absolute: 0.1 10*3/uL (ref 0.0–0.7)
Eosinophils Relative: 1.3 % (ref 0.0–5.0)
HCT: 42.1 % (ref 39.0–52.0)
Hemoglobin: 14.2 g/dL (ref 13.0–17.0)
Lymphocytes Relative: 27.1 % (ref 12.0–46.0)
Lymphs Abs: 1.8 10*3/uL (ref 0.7–4.0)
MCHC: 33.7 g/dL (ref 30.0–36.0)
MCV: 92.3 fl (ref 78.0–100.0)
Monocytes Absolute: 0.5 10*3/uL (ref 0.1–1.0)
Monocytes Relative: 8 % (ref 3.0–12.0)
Neutro Abs: 4.2 10*3/uL (ref 1.4–7.7)
Neutrophils Relative %: 63.2 % (ref 43.0–77.0)
Platelets: 403 10*3/uL — ABNORMAL HIGH (ref 150.0–400.0)
RBC: 4.56 Mil/uL (ref 4.22–5.81)
RDW: 13.1 % (ref 11.5–15.5)
WBC: 6.6 10*3/uL (ref 4.0–10.5)

## 2022-09-01 LAB — COMPREHENSIVE METABOLIC PANEL
ALT: 22 U/L (ref 0–53)
AST: 15 U/L (ref 0–37)
Albumin: 4 g/dL (ref 3.5–5.2)
Alkaline Phosphatase: 65 U/L (ref 39–117)
BUN: 15 mg/dL (ref 6–23)
CO2: 27 mEq/L (ref 19–32)
Calcium: 9.5 mg/dL (ref 8.4–10.5)
Chloride: 104 mEq/L (ref 96–112)
Creatinine, Ser: 1 mg/dL (ref 0.40–1.50)
GFR: 81.06 mL/min (ref >60.00)
Glucose, Bld: 120 mg/dL — ABNORMAL HIGH (ref 70–99)
Potassium: 4.6 mEq/L (ref 3.5–5.1)
Sodium: 139 mEq/L (ref 135–145)
Total Bilirubin: 0.3 mg/dL (ref 0.2–1.2)
Total Protein: 7.1 g/dL (ref 6.0–8.3)

## 2022-09-01 LAB — TSH: TSH: 1.21 u[IU]/mL (ref 0.35–5.50)

## 2022-09-01 LAB — VITAMIN B12: Vitamin B-12: 535 pg/mL (ref 211–911)

## 2022-09-01 LAB — TESTOSTERONE: Testosterone: 244.66 ng/dL — ABNORMAL LOW (ref 300.00–890.00)

## 2022-09-01 NOTE — Progress Notes (Signed)
OFFICE VISIT  09/01/2022  CC:  Chief Complaint  Patient presents with   Cough    Ongoing; mainly dry; has chest tightness. Was taking Mucnex which helped suppress cough    Patient is a 62 y.o. male who presents for cough.  HPI: Has had cough for months now.  Recently was worse with an upper respiratory infection but does feel like it is returned to his baseline.  Describes some sound of expiratory wheezing and chest tightness.  Also describes some inspiratory stridor.  Voice is a bit gravelly on a chronic basis.  He does not lose his voice. Not much feeling of shortness of breath.  No chest pain.  No fevers.  No hemoptysis.  No weight loss. He is on Symbicort 80-4 0.5, 2 puffs twice a day chronically.  He also has albuterol that he uses as needed.  He does feel like he is slightly worse if he does not take these inhalers. He sees an allergist but does not feel like his asthma is really getting addressed.  He is chronically fatigued.  He does have some stress due to currently being jobless, searching for a job.  He does have chronically decreased libido.  No melena. He does report having some episodes of blood in urine about 2 weeks ago when he was in the midst of a upper respiratory infection.  He had no flank pain or abdominal pain or groin pain at that time.  No dysuria.  His B12 was low back in September 2023 and I started him on sublingual 1000 mcg B12.  ROS as above, plus-->  no CP, no dizziness, no HAs, no rashes, no melena/hematochezia.  No polyuria or polydipsia.  No myalgias or arthralgias.  No focal weakness, paresthesias, or tremors.  No acute vision or hearing abnormalities.  No dysuria or unusual/new urinary urgency or frequency.  No recent changes in lower legs. No n/v/d. No palpitations.    Past Medical History:  Diagnosis Date   Allergic rhinitis    Much improved with allergy immunotherapy. Restart immunotherapy w/Dr. Whelan winter 2020 (cat/dog dander, pollens).    Asthma    well controlled since allergies under control with immunotherapy, has albut   Blood transfusion without reported diagnosis    BPH (benign prostatic hypertrophy) 2014   TURP   Chronic constipation    Chronic sinusitis    Deviated nasal septum: >90% airway obstruction on right.  Dr. Wilburn Cornelia discussed possible septoplasty and turbinate reduction with pt in 2015.   Constipation, chronic 11/24/2011   H/O blood clots    in eye    H/O nonmelanoma skin cancer    History of adenomatous polyp of colon    Most recent colonoscopy 07/2017---recall 5 yrs.   Idiopathic urticaria    IFG (impaired fasting glucose) 2019; 2021   A1c 5.8% 2019 and 2021.   Lower extremity venous stasis    OSA on CPAP 2005   cpap nightly   Personal history of colonic polyps 03/24/2012   03/2012 - 5 mm cecal adenoma   PUD (peptic ulcer disease)    Bleeding ulcer 1978   Relapsing remitting multiple sclerosis ([place]) 1992   Remission since 1996 on no meds.  Initial presentation was gait/balance and leg weakness sx's.  Additional relapses with blurry vision and uncoordination.  Dr. Tomi Likens 04/2017 recommended re-estab baseline MRI brain and C-spine + start disease modifying therapy but pt declined.   Thyroid nodule 05/2012   Right lobe 37mX14mm nodule found on screening thyroid  and carotid u/s done through his employer.  Pt was euthyroid at that time.  A radioactive iodine uptake and scan was normal 06/2012.  FNA bx 04/2015 was BENIGN/CYST.    Past Surgical History:  Procedure Laterality Date   APPENDECTOMY  1986   COLONOSCOPY  2006, 2013; 07/2017   diminutive hyperplastic cecal polyp ([place]); diminutive cecal adenoma removed 2013.  Repeat 07/24/17, one polyp--recall 5 yrs.   FEMUR FRACTURE SURGERY  1978   with rod insertion.  Rod removal 1979   FNA R Thyroid nodule  04/17/2015   Benign/cystic (Dr. 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;  Service: General;  Laterality: Right;   INSERTION OF MESH Right 10/22/2015   Procedure: INSERTION OF MESH;  Surgeon: Mickeal Skinner, MD;  Location: [place];  Service: General;  Laterality: Right;   TRANSTHORACIC ECHOCARDIOGRAM  02/21/2021   EF 60-65%, all normal except ascending aorta 42 mm.  CT chest 04/29/21 showed same measurement.   TRANSURETHRAL RESECTION OF PROSTATE N/A 08/25/2012   Procedure: TRANSURETHRAL RESECTION OF THE PROSTATE WITH GYRUS INSTRUMENTS;  Surgeon: Alexis Frock, MD;  Location: [facility];  Service: Urology;  Laterality: N/A;--patient got excellent results from procedure.    Outpatient Medications Prior to Visit  Medication Sig Dispense Refill   albuterol (VENTOLIN HFA) 108 (90 Base) MCG/ACT inhaler INHALE 2 PUFFS INTO THE LUNGS EVERY 4 HOURS AS NEEDED FOR SHORTNESS OF BREATH (Patient taking differently: Inhale 2 puffs into the lungs every 4 (four) hours as needed for wheezing or shortness of breath.) 18 g 1   aspirin EC 81 MG tablet Take 81 mg by mouth daily.     B Complex-Folic Acid (B COMPLEX-VITAMIN B12 PO) Take 1 capsule by mouth daily. Unknown strenght     Calcium-Magnesium-Zinc 167-83-8 MG TABS Take 1 tablet by mouth 2 (two) times daily after a meal.     cholecalciferol (VITAMIN D) 1000 units tablet Take 1,000 Units by mouth daily.     ePHEDrine HCl (PRIMATENE) 12.5 MG TABS Take 12.5 mg by mouth as needed (SOB, Wheezing).     EPINEPHrine 0.3 mg/0.3 mL IJ SOAJ injection INJECT INTO OUTER THIGH AS NEEDED FOR ANAPHYLAXIS (Patient taking differently: Inject 0.3 mg into the muscle as needed for anaphylaxis. INJECT INTO OUTER THIGH AS NEEDED FOR ANAPHYLAXIS) 1 each 2   fluorouracil (EFUDEX) 5 % cream Apply 1 application  topically daily as needed (rash).  0   losartan (COZAAR) 25 MG tablet Take 1 tablet (25 mg total) by mouth daily. 90 tablet 3   Olopatadine HCl 0.6 % SOLN Place 2  sprays into both nostrils 2 (two) times daily.     psyllium (METAMUCIL) 58.6 % powder Take 1 packet by mouth 2 (two) times daily.      senna (SENOKOT) 8.6 MG tablet Take 2 tablets by mouth 2 (two) times daily as needed for constipation.     SYMBICORT 80-4.5 MCG/ACT inhaler Inhale 2 puffs into the lungs daily.     tadalafil (CIALIS) 10 MG tablet 1-2 tabs po qd prn (Patient taking differently: Take 10 mg by mouth daily as needed for erectile dysfunction. 1-2 tabs po qd prn) 10 tablet 6   vitamin C (ASCORBIC ACID) 500 MG tablet Take 500 mg by mouth daily.     amoxicillin-clavulanate (AUGMENTIN) 875-125 MG tablet Take 1 tablet by mouth 2 (two) times daily.  28 tablet 0   No facility-administered medications prior to visit.    Allergies  Allergen Reactions   Molds & Smuts Other (See Comments)    Review of Systems  As per HPI  PE:    09/01/2022    1:44 PM 08/25/2022    4:09 PM 07/30/2022    9:15 AM  Vitals with BMI  Height 5' 10"$  5' 10"$  5' 10"$   Weight 232 lbs 6 oz 235 lbs 10 oz 236 lbs  BMI 33.35 [id number]  Systolic 0000000 [id number]  Diastolic 64 74 82  Pulse 65 80 65     Physical Exam  Gen: Alert, well appearing.  Patient is oriented to person, place, time, and situation. AFFECT: pleasant, lucid thought and speech. [id number]: no injection, icteris, swelling, or exudate.  EOMI, PERRLA. Mouth: lips without lesion/swelling.  Oral mucosa pink and moist. Oropharynx without erythema, exudate, or swelling.  Neck - No masses or thyromegaly or limitation in range of motion CV: RRR, no m/r/g.   LUNGS: CTA bilat, nonlabored resps, good aeration in all lung fields.   LABS:  Last CBC Lab Results  Component Value Date   WBC 6.6 09/01/2022   HGB 14.2 09/01/2022   HCT 42.1 09/01/2022   MCV 92.3 09/01/2022   MCH 30.3 09/11/2012   RDW 13.1 09/01/2022   PLT 403.0 (H) 09/01/2022   Lab Results  Component Value Date   TSH 1.21 0000000   Last metabolic panel Lab Results  Component Value  Date   GLUCOSE 120 (H) 09/01/2022   NA 139 09/01/2022   K 4.6 09/01/2022   CL 104 09/01/2022   CO2 27 09/01/2022   BUN 15 09/01/2022   CREATININE 1.00 09/01/2022   EGFR 86 04/18/2021   CALCIUM 9.5 09/01/2022   PROT 7.1 09/01/2022   ALBUMIN 4.0 09/01/2022   BILITOT 0.3 09/01/2022   ALKPHOS 65 09/01/2022   AST 15 09/01/2022   ALT 22 09/01/2022   Lab Results  Component Value Date   [id number] 09/01/2022   Lab Results  Component Value Date   TESTOSTERONE 244.66 (L) 09/01/2022   Lab Results  Component Value Date   HGBA1C 5.9 04/26/2021   IMPRESSION AND PLAN:  #1 chronic cough. Question due to poorly controlled asthma. Not much in the way of wheezing or other signs of airflow limitation today. ? Component of vocal cord dysfunction. Referral to pulmonology ordered today.  Continue Symbicort 80-4 0.5, 2 puffs twice daily as well as albuterol inhaler 2 puffs every 4 hours as needed.  2.  Gross hematuria. UA today is normal. Referral to urology ordered today.  3.  Chronic fatigue+dec libido. History of borderline low testosterone.  History of B12 deficiency. Recheck B12 level today--> has been on oral supplement for about 5 months now. Recheck testosterone level. Checking CBC, TSH, and complete metabolic panel today as well.  An After Visit Summary was printed and given to the patient.  FOLLOW UP: Return for as needed.  Signed:  Crissie Sickles, MD           09/01/2022

## 2022-09-03 ENCOUNTER — Encounter: Payer: Self-pay | Admitting: Family Medicine

## 2022-09-03 ENCOUNTER — Other Ambulatory Visit: Payer: Self-pay | Admitting: Family Medicine

## 2022-09-03 DIAGNOSIS — D75839 Thrombocytosis, unspecified: Secondary | ICD-10-CM

## 2022-09-03 NOTE — Telephone Encounter (Signed)
FYI  Please see below

## 2022-09-03 NOTE — Telephone Encounter (Signed)
noted 

## 2022-09-08 DIAGNOSIS — G4733 Obstructive sleep apnea (adult) (pediatric): Secondary | ICD-10-CM | POA: Diagnosis not present

## 2022-09-11 ENCOUNTER — Encounter: Payer: Self-pay | Admitting: Internal Medicine

## 2022-09-17 ENCOUNTER — Other Ambulatory Visit (INDEPENDENT_AMBULATORY_CARE_PROVIDER_SITE_OTHER): Payer: BC Managed Care – PPO

## 2022-09-17 DIAGNOSIS — D75839 Thrombocytosis, unspecified: Secondary | ICD-10-CM

## 2022-09-17 LAB — CBC WITH DIFFERENTIAL/PLATELET
Basophils Absolute: 0 10*3/uL (ref 0.0–0.1)
Basophils Relative: 0.5 % (ref 0.0–3.0)
Eosinophils Absolute: 0.1 10*3/uL (ref 0.0–0.7)
Eosinophils Relative: 1.6 % (ref 0.0–5.0)
HCT: 41.9 % (ref 39.0–52.0)
Hemoglobin: 13.9 g/dL (ref 13.0–17.0)
Lymphocytes Relative: 29.9 % (ref 12.0–46.0)
Lymphs Abs: 1.4 10*3/uL (ref 0.7–4.0)
MCHC: 33.2 g/dL (ref 30.0–36.0)
MCV: 93.1 fl (ref 78.0–100.0)
Monocytes Absolute: 0.5 10*3/uL (ref 0.1–1.0)
Monocytes Relative: 11.6 % (ref 3.0–12.0)
Neutro Abs: 2.6 10*3/uL (ref 1.4–7.7)
Neutrophils Relative %: 56.4 % (ref 43.0–77.0)
Platelets: 220 10*3/uL (ref 150.0–400.0)
RBC: 4.5 Mil/uL (ref 4.22–5.81)
RDW: 13.8 % (ref 11.5–15.5)
WBC: 4.5 10*3/uL (ref 4.0–10.5)

## 2022-09-18 ENCOUNTER — Encounter: Payer: Self-pay | Admitting: Internal Medicine

## 2022-09-18 ENCOUNTER — Ambulatory Visit: Payer: BC Managed Care – PPO | Admitting: Internal Medicine

## 2022-09-18 VITALS — BP 136/74 | HR 66 | Temp 98.1°F | Ht 70.0 in | Wt 231.6 lb

## 2022-09-18 DIAGNOSIS — Z9109 Other allergy status, other than to drugs and biological substances: Secondary | ICD-10-CM

## 2022-09-18 DIAGNOSIS — G4733 Obstructive sleep apnea (adult) (pediatric): Secondary | ICD-10-CM | POA: Diagnosis not present

## 2022-09-18 DIAGNOSIS — J4540 Moderate persistent asthma, uncomplicated: Secondary | ICD-10-CM

## 2022-09-18 DIAGNOSIS — R053 Chronic cough: Secondary | ICD-10-CM | POA: Diagnosis not present

## 2022-09-18 LAB — POCT EXHALED NITRIC OXIDE: FeNO level (ppb): 11

## 2022-09-18 MED ORDER — BUDESONIDE-FORMOTEROL FUMARATE 160-4.5 MCG/ACT IN AERO: 2.0000 | INHALATION_SPRAY | Freq: Two times a day (BID) | RESPIRATORY_TRACT | 6 refills | Status: AC

## 2022-09-18 MED ORDER — AZELASTINE HCL 0.1 % NA SOLN: 1.0000 | Freq: Every day | NASAL | 12 refills | Status: AC

## 2022-09-18 NOTE — Patient Instructions (Addendum)
Please schedule follow up scheduled with myself in 2 months.  If my schedule is not open yet, we will contact you with a reminder closer to that time. Please call [phone] if you haven't heard from [place] a month before.   I will get breathing testing on  you at the next visit.  Let's increase your symbicort to the higher dose. Continue 2 puffs twice a day, gargle after use.  Take the albuterol rescue inhaler every 4 to 6 hours as needed for chest tightness, coughing, wheezing or shortness of breath. You can also take it 15 minutes before exercise or exertional activity. Side effects include heart racing or pounding, jitters or anxiety. If you have a history of an irregular heart rhythm, it can make this worse. Can also give some patients a hard time sleeping.  I am referring you to allergy for evaluation of your environmental allergies.   Stop affrin (oxymetazoline) nasal spray as this can cause rebound congestion and should only be used for 3-5 days. Switch to astelin nasal spray. Keep taking flonase(can do one in morning, one at night). Keep doing your nasal lavage before medicated sprays.   By learning about asthma and how it can be controlled, you take an important step toward managing this disease. Work closely with your asthma care team to learn all you can about your asthma, how to avoid triggers, what your medications do, and how to take them correctly. With proper care, you can live free of asthma symptoms and maintain a normal, healthy lifestyle.   What is asthma? Asthma is a chronic disease that affects the airways of the lungs. During normal breathing, the bands of muscle that surround the airways are relaxed and air moves freely. During an asthma episode or "attack," there are three main changes that stop air from moving easily through the airways: The bands of muscle that surround the airways tighten and make the airways narrow. This tightening is called bronchospasm.  The lining  of the airways becomes swollen or inflamed.  The cells that line the airways produce more mucus, which is thicker than normal and clogs the airways.  These three factors - bronchospasm, inflammation, and mucus production - cause symptoms such as difficulty breathing, wheezing, and coughing.  What are the most common symptoms of asthma? Asthma symptoms are not the same for everyone. They can even change from episode to episode in the same person. Also, you may have only one symptom of asthma, such as cough, but another person may have all the symptoms of asthma. It is important to know all the symptoms of asthma and to be aware that your asthma can present in any of these ways at any time. The most common symptoms include: Coughing, especially at night  Shortness of breath  Wheezing  Chest tightness, pain, or pressure   Who is affected by asthma? Asthma affects 22 million Americans; about 6 million of these are children under age 29. People who have a family history of asthma have an increased risk of developing the disease. Asthma is also more common in people who have allergies or who are exposed to tobacco smoke. However, anyone can develop asthma at any time. Some people may have asthma all of their lives, while others may develop it as adults.  What causes asthma? The airways in a person with asthma are very sensitive and react to many things, or "triggers." Contact with these triggers causes asthma symptoms. One of the most important parts  of asthma control is to identify your triggers and then avoid them when possible. The only trigger you do not want to avoid is exercise. Pre-treatment with medicines before exercise can allow you to stay active yet avoid asthma symptoms. Common asthma triggers include: Infections (colds, viruses, flu, sinus infections)  Exercise  Weather (changes in temperature and/or humidity, cold air)  Tobacco smoke  Allergens (dust mites, pollens, pets, mold spores,  cockroaches, and sometimes foods)  Irritants (strong odors from cleaning products, perfume, wood smoke, air pollution)  Strong emotions such as crying or laughing hard  Some medications   How is asthma diagnosed? To diagnose asthma, your doctor will first review your medical history, family history, and symptoms. Your doctor will want to know any past history of breathing problems you may have had, as well as a family history of asthma, allergies, eczema (a bumpy, itchy skin rash caused by allergies), or other lung disease. It is important that you describe your symptoms in detail (cough, wheeze, shortness of breath, chest tightness), including when and how often they occur. The doctor will perform a physical examination and listen to your heart and lungs. He or she may also order breathing tests, allergy tests, blood tests, and chest and sinus X-rays. The tests will find out if you do have asthma and if there are any other conditions that are contributing factors.  How is asthma treated? Asthma can be controlled, but not cured. It is not normal to have frequent symptoms, trouble sleeping, or trouble completing tasks. Appropriate asthma care will prevent symptoms and visits to the emergency room and hospital. Asthma medicines are one of the mainstays of asthma treatment. The drugs used to treat asthma are explained below.  Anti-inflammatories: These are the most important drugs for most people with asthma. Anti-inflammatory drugs reduce swelling and mucus production in the airways. As a result, airways are less sensitive and less likely to react to triggers. These medications need to be taken daily and may need to be taken for several weeks before they begin to control asthma. Anti-inflammatory medicines lead to fewer symptoms, better airflow, less sensitive airways, less airway damage, and fewer asthma attacks. If taken every day, they CONTROL or prevent asthma symptoms.   Bronchodilators: These drugs  relax the muscle bands that tighten around the airways. This action opens the airways, letting more air in and out of the lungs and improving breathing. Bronchodilators also help clear mucus from the lungs. As the airways open, the mucus moves more freely and can be coughed out more easily. In short-acting forms, bronchodilators RELIEVE or stop asthma symptoms by quickly opening the airways and are very helpful during an asthma episode. In long-acting forms, bronchodilators provide CONTROL of asthma symptoms and prevent asthma episodes.  Asthma drugs can be taken in a variety of ways. Inhaling the medications by using a metered dose inhaler, dry powder inhaler, or nebulizer is one way of taking asthma medicines. Oral medicines (pills or liquids you swallow) may also be prescribed.  Asthma severity Asthma is classified as either "intermittent" (comes and goes) or "persistent" (lasting). Persistent asthma is further described as being mild, moderate, or severe. The severity of asthma is based on how often you have symptoms both during the day and night, as well as by the results of lung function tests and by how well you can perform activities. The "severity" of asthma refers to how "intense" or "strong" your asthma is.  Asthma control Asthma control is the goal of  asthma treatment. Regardless of your asthma severity, it may or may not be controlled. Asthma control means: You are able to do everything you want to do at work and home  You have no (or minimal) asthma symptoms  You do not wake up from your sleep or earlier than usual in the morning due to asthma  You rarely need to use your reliever medicine (inhaler)  Another major part of your treatment is that you are happy with your asthma care and believe your asthma is controlled.  Monitoring symptoms A key part of treatment is keeping track of how well your lungs are working. Monitoring your symptoms, what they are, how and when they happen, and how  severe they are, is an important part of being able to control your asthma.  Sometimes asthma is monitored using a peak flow meter. A peak flow (PF) meter measures how fast the air comes out of your lungs. It can help you know when your asthma is getting worse, sometimes even before you have symptoms. By taking daily peak flow readings, you can learn when to adjust medications to keep asthma under good control. It is also used to create your asthma action plan (see below). Your doctor can use your peak flow readings to adjust your treatment plan in some cases.  Asthma Action Plan Based on your history and asthma severity, you and your doctor will develop a care plan called an "asthma action plan." The asthma action plan describes when and how to use your medicines, actions to take when asthma worsens, and when to seek emergency care. Make sure you understand this plan. If you do not, ask your asthma care provider any questions you may have. Your asthma action plan is one of the keys to controlling asthma. Keep it readily available to remind you of what you need to do every day to control asthma and what you need to do when symptoms occur.  Goals of asthma therapy These are the goals of asthma treatment: Live an active, normal life  Prevent chronic and troublesome symptoms  Attend work or school every day  Perform daily activities without difficulty  Stop urgent visits to the doctor, emergency department, or hospital  Use and adjust medications to control asthma with few or no side effects

## 2022-09-18 NOTE — Progress Notes (Signed)
Brian Walsh    [id number]    01-03-1961  Primary Care Physician:McGowen, Adrian Blackwater, MD Date of Appointment: 09/18/2022 Established Patient Visit  Chief complaint:   Chief Complaint  Patient presents with   Consult    Chronic productive cough x 6 months      HPI: Brian Walsh is a 62 y.o. man with history of OSA on CPAP who presents for new patient evaluation today for chronic cough. Has been seen in our office in the past for OSA.   Interval Updates: He presents as a new patient to me today for evaluation of chronic cough for the past 6 months.  He does have a history of asthma.  He has a deviated septum and uses nasal saline with distilled water twice a day. Uses affrin daily with flonase daily.   Asthma was well controlled ten years ago when he was getting allergy shots in [place] ([facility] in 2012.)  Sees [facility] and did shots again here but felt they were less efficacious.   Treated for pneumonia with in fall 2023 and had clearing of mucoid impaction on ct chest.   Has chronic cough which is productive, frequent throat clearing, harsh,  Has had episodes of chest tightness.   Diagnosed with asthma in his 65s. Was doing well.   Has had some unexplained episodes of angioedema/lip swelling (was exposed to animals and pine trees at the same time.)  Goes to the gym daily and has no impairments.  He takes albuterol as needed for the chest tightness and he does perceive benefit.   He feels like his post nasal drainage is well controlled.   Current Regimen: symbicort 80 mcg 2 puffs bid, albuterol as needed.  Asthma Triggers: allergies, cats, dogs Exacerbations in the last year: none History of hospitalization or intubation:never Allergy Testing: yes with history of allergy shots in mid 2010s.  GERD: no.  Allergic Rhinitis: ACT:  Asthma Control Test ACT Total Score  09/18/2022  8:42 AM 23   FeNO: 11  ppb  Never smoker Passive smoke exposure in childhood.   Family hx Both brothers have asthma. 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   I have reviewed the patient's family social and past medical history and updated as appropriate.   Past Medical History:  Diagnosis Date   Abnormal chest CT    04/2021 right lower lobe mucoid impaction with inflammatory changes--> resolved on 08/2022 follow-up CT.   Allergic rhinitis    Much improved with allergy immunotherapy. Restart immunotherapy w/Dr. Whelan winter 2020 (cat/dog dander, pollens).   Asthma    well controlled since allergies under control with immunotherapy, has albut   Blood transfusion without reported diagnosis    BPH (benign prostatic hypertrophy) 2014   TURP   Chronic sinusitis    Deviated nasal septum: >90% airway obstruction on right.  Dr. Wilburn Cornelia discussed possible septoplasty and turbinate reduction with pt in 2015.   Constipation, chronic 11/24/2011   H/O blood clots    in eye    H/O nonmelanoma skin cancer    History of adenomatous polyp of colon    Most recent colonoscopy 07/2017---recall 5 yrs.   Idiopathic urticaria    IFG (impaired fasting glucose) 2019; 2021   A1c 5.8% 2019 and 2021.   Lower extremity venous stasis    Male hypogonadism    2023/24   OSA on CPAP 2005   cpap nightly  Personal history of colonic polyps 03/24/2012   03/2012 - 5 mm cecal adenoma   PUD (peptic ulcer disease)    Bleeding ulcer 1978   Relapsing remitting multiple sclerosis ([place]) 1992   Remission since 1996 on no meds.  Initial presentation was gait/balance and leg weakness sx's.  Additional relapses with blurry vision and uncoordination.  Dr. Tomi Likens 04/2017 recommended re-estab baseline MRI brain and C-spine + start disease modifying therapy but pt declined.   Thyroid nodule 05/2012   Right lobe 16mX14mm nodule found on screening thyroid and carotid u/s done through his employer.  Pt was euthyroid at that time.  A  radioactive iodine uptake and scan was normal 06/2012.  FNA bx 04/2015 was BENIGN/CYST.    Past Surgical History:  Procedure Laterality Date   APPENDECTOMY  1986   COLONOSCOPY  2006, 2013; 07/2017   diminutive hyperplastic cecal polyp ([place]); diminutive cecal adenoma removed 2013.  Repeat 07/24/17, one polyp--recall 5 yrs.   FEMUR FRACTURE SURGERY  1978   with rod insertion.  Rod removal 1979   FNA R Thyroid nodule  04/17/2015   Benign/cystic (Dr. GCruzita Lederer   IStartRight 10/22/2015   Procedure: LAPAROSCOPIC RIGHT INGUINAL HERNIA REPAIR;  Surgeon: LMickeal Skinner MD;  [facility]  Service: General;  Laterality: Right;   INSERTION OF MESH Right 10/22/2015   Procedure: INSERTION OF MESH;  Surgeon: LMickeal Skinner MD;  [facility]  Service: General;  Laterality: Right;   TRANSTHORACIC ECHOCARDIOGRAM  02/21/2021   EF 60-65%, all normal except ascending aorta 42 mm.  CT chest 04/29/21 showed same measurement.   TRANSURETHRAL RESECTION OF PROSTATE N/A 08/25/2012   Procedure: TRANSURETHRAL RESECTION OF THE PROSTATE WITH GYRUS INSTRUMENTS;  Surgeon: TAlexis Frock MD;  Location: [facility];  Service: Urology;  Laterality: N/A;--patient got excellent results from procedure.    Family History  Problem Relation Age of Onset   Parkinsonism Father    Stroke Father    Colon polyps Father 544  Breast cancer Mother    Colon cancer Neg Hx    Esophageal cancer Neg Hx    Rectal cancer Neg Hx    Stomach cancer Neg Hx     Social History   Occupational History   Occupation: HR [employer]  Tobacco Use   Smoking status: Never   Smokeless tobacco: Never  Vaping Use   Vaping Use: Never used  Substance and Sexual Activity   Alcohol use: Yes    Alcohol/week: 1.0 standard drink of alcohol    Types: 1 Glasses of wine per week    Comment: occasional wine    Drug use: No   Sexual activity: Not  on file     Physical Exam: Blood pressure 136/74, pulse 66, temperature 98.1 F (36.7 C), temperature source Oral, height '5\' 10"'$  (1.778 m), weight 231 lb 9.6 oz (105.1 kg), SpO2 95 %.  Gen:      No acute distress ENT:  no nasal polyps, mucus membranes moist Lungs:    No increased respiratory effort, symmetric chest wall excursion, clear to auscultation bilaterally, no wheezes or crackles CV:         Regular rate and rhythm; no murmurs, rubs, or gallops.  No pedal edema   Data Reviewed: Imaging: I have personally reviewed the CT angio from Oct 2022 to feb 2024 which shows bilateral mild bronchial wall thickening, mild RLL atelectasis and mucoid impaction  which has resolved.   PFTs:      No data to display           Labs: Lab Results  Component Value Date   NA 139 09/01/2022   K 4.6 09/01/2022   CO2 27 09/01/2022   GLUCOSE 120 (H) 09/01/2022   BUN 15 09/01/2022   CREATININE 1.00 09/01/2022   CALCIUM 9.5 09/01/2022   EGFR 86 04/18/2021   GFRNONAA >90 09/11/2012   Lab Results  Component Value Date   NA 139 09/01/2022   K 4.6 09/01/2022   CL 104 09/01/2022   CO2 27 09/01/2022     Immunization status: Immunization History  Administered Date(s) Administered   Influenza Split 03/14/2012   Influenza, High Dose Seasonal PF 06/27/2021   Influenza,inj,Quad PF,6+ Mos 04/26/2021, 04/21/2022   Influenza-Unspecified 04/14/2015   Moderna SARS-COV2 Booster Vaccination 06/12/2020   Moderna Sars-Covid-2 Vaccination 09/19/2019, 10/19/2019   PNEUMOCOCCAL CONJUGATE-20 04/26/2021   Tdap 11/24/2011   Zoster Recombinat (Shingrix) 01/02/2017, 03/17/2017    External Records Personally Reviewed: PCP. ED  Assessment:  Moderate persisent asthma Chronic rhinitis   Plan/Recommendations:  I will get breathing testing on  you at the next visit. - spirometry. Feno obtained today 11 ppb.   Increase your symbicort to the higher dose 160 mcg. Continue 2 puffs twice a day, gargle  after use.  Continue taking the albuterol rescue inhaler every 4 to 6 hours as needed for chest tightness, coughing, wheezing or shortness of breath.  I am referring you to allergy for evaluation of your environmental allergies and to see if immunotherapy would be useful again.   Stop afrin (oxymetazoline) nasal spray as this can cause rebound congestion and should only be used for 3-5 days. Switch to astelin nasal spray. Keep taking flonase(can do one in morning, one at night). Keep doing your nasal lavage before medicated sprays.    I spent 40 minutes on 09/18/2022 in care of this patient including face to face time and non-face to face time spent charting, review of outside records, and coordination of care.   Return to Care: Return in about 2 months (around 11/18/2022).   Lenice Llamas, MD Pulmonary and [place]

## 2022-09-23 DIAGNOSIS — N3941 Urge incontinence: Secondary | ICD-10-CM | POA: Diagnosis not present

## 2022-09-23 DIAGNOSIS — Z125 Encounter for screening for malignant neoplasm of prostate: Secondary | ICD-10-CM | POA: Diagnosis not present

## 2022-09-23 DIAGNOSIS — N5201 Erectile dysfunction due to arterial insufficiency: Secondary | ICD-10-CM | POA: Diagnosis not present

## 2022-09-23 DIAGNOSIS — R310 Gross hematuria: Secondary | ICD-10-CM | POA: Diagnosis not present

## 2022-10-07 DIAGNOSIS — G4733 Obstructive sleep apnea (adult) (pediatric): Secondary | ICD-10-CM | POA: Diagnosis not present

## 2022-10-15 ENCOUNTER — Ambulatory Visit: Payer: BC Managed Care – PPO | Admitting: Neurology

## 2022-10-21 ENCOUNTER — Ambulatory Visit: Payer: BC Managed Care – PPO | Admitting: Neurology

## 2022-10-28 DIAGNOSIS — R310 Gross hematuria: Secondary | ICD-10-CM | POA: Diagnosis not present

## 2022-10-28 DIAGNOSIS — N3289 Other specified disorders of bladder: Secondary | ICD-10-CM | POA: Diagnosis not present

## 2022-10-28 DIAGNOSIS — R300 Dysuria: Secondary | ICD-10-CM | POA: Diagnosis not present

## 2022-10-31 ENCOUNTER — Ambulatory Visit: Payer: Self-pay | Admitting: Internal Medicine

## 2022-10-31 ENCOUNTER — Encounter: Payer: Self-pay | Admitting: Allergy

## 2022-10-31 ENCOUNTER — Ambulatory Visit: Payer: BC Managed Care – PPO | Admitting: Allergy

## 2022-10-31 ENCOUNTER — Other Ambulatory Visit: Payer: Self-pay

## 2022-10-31 VITALS — BP 126/78 | HR 54 | Temp 98.4°F | Resp 18 | Ht 68.5 in | Wt 231.0 lb

## 2022-10-31 DIAGNOSIS — T7840XD Allergy, unspecified, subsequent encounter: Secondary | ICD-10-CM

## 2022-10-31 DIAGNOSIS — J3089 Other allergic rhinitis: Secondary | ICD-10-CM

## 2022-10-31 DIAGNOSIS — J4540 Moderate persistent asthma, uncomplicated: Secondary | ICD-10-CM | POA: Diagnosis not present

## 2022-10-31 DIAGNOSIS — T7840XA Allergy, unspecified, initial encounter: Secondary | ICD-10-CM | POA: Insufficient documentation

## 2022-10-31 MED ORDER — ALBUTEROL SULFATE HFA 108 (90 BASE) MCG/ACT IN AERS: 2.0000 | INHALATION_SPRAY | RESPIRATORY_TRACT | 1 refills | Status: DC | PRN

## 2022-10-31 MED ORDER — EPINEPHRINE 0.3 MG/0.3ML IJ SOAJ: 0.3000 mg | INTRAMUSCULAR | 1 refills | Status: AC | PRN

## 2022-10-31 NOTE — Progress Notes (Signed)
New Patient Note  RE: Brian Walsh MRN: [id number] DOB: Apr 30, 1961 Date of Office Visit: 10/31/2022  Consult requested by: Jeoffrey Massed, MD Primary care provider: Jeoffrey Massed, MD  Chief Complaint: Establish Care, Asthma, Nasal Congestion, Allergic Reaction, and Sinusitis  History of Present Illness: I had the pleasure of seeing Brian Walsh for initial evaluation at the [facility] on 10/31/2022. He is a 62 y.o. male, who is referred here by Dr. Celine Mans (pulmonology) for the evaluation of transfer of care for allergic rhinitis.  Environmental allergies:  He reports symptoms of contact hives/rash with horse contact, nasal congestion,. Symptoms have been going on for 15+ years. The symptoms are present all year around with worsening in spring and fall. Other triggers include exposure to: horse. Anosmia: diminished sense of smell. Headache: yes. He has used saline rinse, azelastine, Flonase, Claritin with some improvement in symptoms. Sinus infections: none the past year. Previous work up includes: 2020 skin testing was positive to multiple items - see scanned records.  Patient was on AIT since 2012. Started in [place] and continued at [facility] allergy. Patient stopped AIT over 1+ year ago as he thought they were ineffective. Did not see any worsening symptoms the past year.   Patient has Epipen as he had such a bad allergic reaction with horse contact that he prefers to have one on hand just in case. No prior use.   Previous ENT evaluation: yes - deviated septum. No prior sinus surgery. Previous sinus imaging: no. History of nasal polyps: no. Last eye exam: no. History of reflux: denies.  Assessment and Plan: Brian Walsh is a 62 y.o. male with: Other allergic rhinitis Perennial rhinitis symptoms for 15+ years. 2020 skin testing was positive to trees, mold, cat and feathers. On AIT since 2012 and stopped over 1 year ago with no worsening symptoms. He is not sure if  AIT helped. Severe reaction when around horses. Reviewed notes from [place] allergy - see scanned records.  Start environmental control measures as below. Use over the counter antihistamines such as Zyrtec (cetirizine), Claritin (loratadine), Allegra (fexofenadine), or Xyzal (levocetirizine) daily as needed. May take twice a day during allergy flares. May switch antihistamines every few months. Use Flonase (fluticasone) sensamist nasal spray 2 sprays per nostril once a day as needed for nasal congestion.  Use azelastine nasal spray 1-2 sprays per nostril twice a day as needed for runny nose/drainage. Nasal saline spray (i.e., Simply Saline) or nasal saline lavage (i.e., NeilMed) is recommended as needed and prior to medicated nasal sprays. Consider allergy injections for long term control if above medications do not help the symptoms - will need to retest if interested.   Allergic reaction Allergic reaction once when around horses. No prior Epipen use but prefers to have one on hand just in case.  I have prescribed epinephrine injectable device and demonstrated proper use. For mild symptoms you can take over the counter antihistamines such as Benadryl 1-2 tablets = 25-50mg  and monitor symptoms closely. If symptoms worsen or if you have severe symptoms including breathing issues, throat closure, significant swelling, whole body hives, severe diarrhea and vomiting, lightheadedness then inject epinephrine and seek immediate medical care afterwards. Emergency action plan given.  Moderate persistent asthma without complication Managed by pulmonology. No recent spirometry. Walsh's spirometry was unremarkable given effort. Daily controller medication(s): Symbicort 2 puffs twice a day and rinse mouth after each use.  May use albuterol rescue inhaler 2 puffs every 4 to 6  hours as needed for shortness of breath, chest tightness, coughing, and wheezing. Monitor frequency of use.  Return in about 1  year (around 10/31/2023).  Meds ordered this encounter  Medications   albuterol (VENTOLIN HFA) 108 (90 Base) MCG/ACT inhaler    Sig: Inhale 2 puffs into the lungs every 4 (four) hours as needed for wheezing or shortness of breath (coughing fits).    Dispense:  18 g    Refill:  1   EPINEPHrine 0.3 mg/0.3 mL IJ SOAJ injection    Sig: Inject 0.3 mg into the muscle as needed for anaphylaxis.    Dispense:  2 each    Refill:  1    May dispense generic/Mylan/Teva brand.   Lab Orders  No laboratory test(s) ordered Walsh    Other allergy screening: Asthma: yes Follows with pulmonology for asthma.  Currently on Symbicort 2 puffs BID and using albuterol on rare occasions. No recent prednisone use.   Food allergy: no Medication allergy: no Hymenoptera allergy: no Urticaria: no Eczema:no History of recurrent infections suggestive of immunodeficency: no  Diagnostics: Spirometry:  Tracings reviewed. His effort: It was hard to get consistent efforts and there is a question as to whether this reflects a maximal maneuver. FVC: 3.98L FEV1: 3.12L, 130% predicted FEV1/FVC ratio: 78% Interpretation: No overt abnormalities noted given Walsh's efforts.  Please see scanned spirometry results for details.   Past Medical History: Patient Active Problem List   Diagnosis Date Noted   Allergic reaction 10/31/2022   Moderate persistent asthma without complication 10/31/2022   Ascending aorta dilatation (HCC) 42 mm based on echocardiogram from 2022 04/18/2021   Allergic rhinitis due to pollen 03/13/2021   Allergic rhinitis due to animal (cat) (dog) hair and dander 03/13/2021   Wheezing 03/13/2021   Essential hypertension 02/05/2021   Lower extremity venous stasis 02/04/2021   IFG (impaired fasting glucose) 02/04/2021   H/O nonmelanoma skin cancer 02/04/2021   H/O blood clots 02/04/2021   Acute non-recurrent frontal sinusitis 08/17/2017   Sinusitis, chronic 07/12/2013   OSA on CPAP  07/12/2013   Tick-borne disease 12/08/2012   Influenza-like illness 08/05/2012   Thyroid nodule 06/04/2012   Personal history of colonic polyps 03/24/2012   Decreased rectal sphincter tone 06/13/2011   Other allergic rhinitis 06/13/2011   Multiple sclerosis 06/13/2011   PUD (peptic ulcer disease) 1992   Past Medical History:  Diagnosis Date   Abnormal chest CT    04/2021 right lower lobe mucoid impaction with inflammatory changes--> resolved on 08/2022 follow-up CT.   Allergic rhinitis    Much improved with allergy immunotherapy. Restart immunotherapy w/Dr. Barnetta Chapel winter 2020 (cat/dog dander, pollens).   Asthma    well controlled since allergies under control with immunotherapy, has albut   Blood transfusion without reported diagnosis    BPH (benign prostatic hypertrophy) 2014   TURP   Chronic sinusitis    Deviated nasal septum: >90% airway obstruction on right.  Dr. Annalee Genta discussed possible septoplasty and turbinate reduction with pt in 2015.   Constipation, chronic 11/24/2011   H/O blood clots    in eye    H/O nonmelanoma skin cancer    History of adenomatous polyp of colon    Most recent colonoscopy 07/2017---recall 5 yrs.   Idiopathic urticaria    IFG (impaired fasting glucose) 2019; 2021   A1c 5.8% 2019 and 2021.   Lower extremity venous stasis    Male hypogonadism    2023/24   OSA on CPAP 2005   cpap  nightly   Personal history of colonic polyps 03/24/2012   03/2012 - 5 mm cecal adenoma   PUD (peptic ulcer disease)    Bleeding ulcer 1978   Recurrent upper respiratory infection (URI)    Relapsing remitting multiple sclerosis 1992   Remission since 1996 on no meds.  Initial presentation was gait/balance and leg weakness sx's.  Additional relapses with blurry vision and uncoordination.  Dr. Everlena Cooper 04/2017 recommended re-estab baseline MRI brain and C-spine + start disease modifying therapy but pt declined.   Thyroid nodule 05/2012   Right lobe 66mmX14mm nodule found on  screening thyroid and carotid u/s done through his employer.  Pt was euthyroid at that time.  A radioactive iodine uptake and scan was normal 06/2012.  FNA bx 04/2015 was BENIGN/CYST.   Past Surgical History: Past Surgical History:  Procedure Laterality Date   APPENDECTOMY  1986   COLONOSCOPY  2006, 2013; 07/2017   diminutive hyperplastic cecal polyp ([place]); diminutive cecal adenoma removed 2013.  Repeat 07/24/17, one polyp--recall 5 yrs.   FEMUR FRACTURE SURGERY  1978   with rod insertion.  Rod removal 1979   FNA R Thyroid nodule  04/17/2015   Benign/cystic (Dr. Elvera Lennox)   INGUINAL HERNIA REPAIR Right 10/22/2015   Procedure: LAPAROSCOPIC RIGHT INGUINAL HERNIA REPAIR;  Surgeon: Rodman Pickle, MD;  Location[facility];  Service: General;  Laterality: Right;   INSERTION OF MESH Right 10/22/2015   Procedure: INSERTION OF MESH;  Surgeon: Rodman Pickle, MD;  Location: [facility] [place];  Service: General;  Laterality: Right;   TONSILLECTOMY     TRANSTHORACIC ECHOCARDIOGRAM  02/21/2021   EF 60-65%, all normal except ascending aorta 42 mm.  CT chest 04/29/21 showed same measurement.   TRANSURETHRAL RESECTION OF PROSTATE N/A 08/25/2012   Procedure: TRANSURETHRAL RESECTION OF THE PROSTATE WITH GYRUS INSTRUMENTS;  Surgeon: Sebastian Ache, MD;  Location: [facility];  Service: Urology;  Laterality: N/A;--patient got excellent results from procedure.   Medication List:  Current Outpatient Medications  Medication Sig Dispense Refill   albuterol (VENTOLIN HFA) 108 (90 Base) MCG/ACT inhaler Inhale 2 puffs into the lungs every 4 (four) hours as needed for wheezing or shortness of breath (coughing fits). 18 g 1   aspirin EC 81 MG tablet Take 81 mg by mouth daily.     azelastine (ASTELIN) 0.1 % nasal spray Place 1 spray into both nostrils daily. Use in each nostril as directed 30 mL 12   B Complex-Folic Acid (B COMPLEX-VITAMIN B12 PO) Take 1 capsule by mouth daily.  Unknown strenght     budesonide-formoterol (SYMBICORT) 160-4.5 MCG/ACT inhaler Inhale 2 puffs into the lungs in the morning and at bedtime. 1 each 6   Calcium-Magnesium-Zinc 167-83-8 MG TABS Take 1 tablet by mouth 2 (two) times daily after a meal.     cholecalciferol (VITAMIN D) 1000 units tablet Take 1,000 Units by mouth daily.     ePHEDrine HCl (PRIMATENE) 12.5 MG TABS Take 12.5 mg by mouth as needed (SOB, Wheezing).     EPINEPHrine 0.3 mg/0.3 mL IJ SOAJ injection Inject 0.3 mg into the muscle as needed for anaphylaxis. 2 each 1   fluorouracil (EFUDEX) 5 % cream Apply 1 application  topically daily as needed (rash).  0   loratadine (CLARITIN) 10 MG tablet Take by mouth.     losartan (COZAAR) 25 MG tablet Take 1 tablet (25 mg total) by mouth daily. 90 tablet 3   psyllium (METAMUCIL) 58.6 % powder Take 1  packet by mouth 2 (two) times daily.      senna (SENOKOT) 8.6 MG tablet Take 2 tablets by mouth 2 (two) times daily as needed for constipation.     sildenafil (VIAGRA) 100 MG tablet SMARTSIG:1 Tablet(s) By Mouth     tadalafil (CIALIS) 10 MG tablet 1-2 tabs po qd prn (Patient taking differently: Take 10 mg by mouth daily as needed for erectile dysfunction. 1-2 tabs po qd prn) 10 tablet 6   vitamin C (ASCORBIC ACID) 500 MG tablet Take 500 mg by mouth daily.     No current facility-administered medications for this visit.   Allergies: Allergies  Allergen Reactions   Molds & Smuts Other (See Comments)   Social History: Social History   Socioeconomic History   Marital status: Married    Spouse name: Not on file   Number of children: 1   Years of education: Not on file   Highest education level: Professional school degree (e.g., MD, DDS, DVM, JD)  Occupational History   Occupation: HR Event organiser: [employer]  Tobacco Use   Smoking status: Never    Passive exposure: Never   Smokeless tobacco: Never  Vaping Use   Vaping Use: Never used  Substance and Sexual Activity    Alcohol use: Yes    Alcohol/week: 1.0 standard drink of alcohol    Types: 1 Glasses of wine per week    Comment: occasional wine    Drug use: No   Sexual activity: Not on file  Other Topics Concern   Not on file  Social History Narrative   Married, has one 41 y/o son.   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   JD and MBA from [employer].   No tobacco, 3-4 glasses of red wine per week, no drug use.   No exercise.   [place], plays tennis, works out at gym 5 days a week,. He has a law degree-04/21/17-sjb   Right handed   2 story home   Caffeine intake yes   Social Determinants of Health   Financial Resource Strain: Low Risk  (08/28/2022)   Overall Financial Resource Strain (CARDIA)    Difficulty of Paying Living Expenses: Not hard at all  Food Insecurity: No Food Insecurity (08/28/2022)   Hunger Vital Sign    Worried About Running Out of Food in the Last Year: Never true    Ran Out of Food in the Last Year: Never true  Transportation Needs: No Transportation Needs (08/28/2022)   PRAPARE - Administrator, Civil Service (Medical): No    Lack of Transportation (Non-Medical): No  Physical Activity: Sufficiently Active (08/28/2022)   Exercise Vital Sign    Days of Exercise per Week: 4 days    Minutes of Exercise per Session: 60 min  Stress: No Stress Concern Present (08/28/2022)   [employer] of Occupational Health - Occupational Stress Questionnaire    Feeling of Stress : Not at all  Social Connections: Socially Integrated (08/28/2022)   Social Connection and Isolation Panel [NHANES]    Frequency of Communication with Friends and Family: Three times a week    Frequency of Social Gatherings with Friends and Family: Once a week    Attends Religious Services: More than 4 times per year    Active Member of [employer] or Organizations: Yes    Attends Banker Meetings: More than 4 times per year    Marital  Status:  Married   Lives in a 62 year old house. Smoking: denies Occupation: Animal nutritionist HistorySurveyor, minerals in the house: no Engineer, civil (consulting) in the family room: no Carpet in the bedroom: no Heating: gas Cooling: central Pet: no  Family History: Family History  Problem Relation Age of Onset   Breast cancer Mother    Allergic rhinitis Father    Parkinsonism Father    Stroke Father    Colon polyps Father 44   Allergic rhinitis Sister    Allergic rhinitis Brother    Allergic rhinitis Brother    Colon cancer Neg Hx    Esophageal cancer Neg Hx    Rectal cancer Neg Hx    Stomach cancer Neg Hx    Review of Systems  Constitutional:  Negative for appetite change, chills, fever and unexpected weight change.  HENT:  Negative for congestion and rhinorrhea.   Eyes:  Negative for itching.  Respiratory:  Positive for cough. Negative for chest tightness, shortness of breath and wheezing.   Cardiovascular:  Negative for chest pain.  Gastrointestinal:  Negative for abdominal pain.  Genitourinary:  Negative for difficulty urinating.  Skin:  Negative for rash.  Allergic/Immunologic: Positive for environmental allergies.  Neurological:  Negative for headaches.    Objective: BP 126/78 (BP Location: Left Arm, Patient Position: Sitting, Cuff Size: Normal)   Pulse (!) 54   Temp 98.4 F (36.9 C) (Temporal)   Resp 18   Ht 5' 8.5" (1.74 m)   Wt 231 lb (104.8 kg)   SpO2 96%   BMI 34.61 kg/m  Body mass index is 34.61 kg/m. Physical Exam Vitals and nursing note reviewed.  Constitutional:      Appearance: Normal appearance. He is well-developed.  HENT:     Head: Normocephalic and atraumatic.     Right Ear: Tympanic membrane and external ear normal.     Left Ear: Tympanic membrane and external ear normal.     Nose: Nose normal.     Mouth/Throat:     Mouth: Mucous membranes are moist.     Pharynx: Oropharynx is clear.  Eyes:     Conjunctiva/sclera: Conjunctivae  normal.  Cardiovascular:     Rate and Rhythm: Normal rate and regular rhythm.     Heart sounds: Normal heart sounds. No murmur heard.    No friction rub. No gallop.  Pulmonary:     Effort: Pulmonary effort is normal.     Breath sounds: Normal breath sounds. No wheezing, rhonchi or rales.  Musculoskeletal:     Cervical back: Neck supple.  Skin:    General: Skin is warm.     Findings: No rash.  Neurological:     Mental Status: He is alert and oriented to person, place, and time.  Psychiatric:        Behavior: Behavior normal.   The plan was reviewed with the patient/family, and all questions/concerned were addressed.  It was my pleasure to see Zekiel Walsh and participate in his care. Please feel free to contact me with any questions or concerns.  Sincerely,  Wyline Mood, DO Allergy & Immunology  [facility] office: [phone] [facility] office: [phone]

## 2022-10-31 NOTE — Patient Instructions (Addendum)
Environmental allergies Start environmental control measures as below. Use over the counter antihistamines such as Zyrtec (cetirizine), Claritin (loratadine), Allegra (fexofenadine), or Xyzal (levocetirizine) daily as needed. May take twice a day during allergy flares. May switch antihistamines every few months. Use Flonase (fluticasone) sensamist nasal spray 2 sprays per nostril once a day as needed for nasal congestion.  Use azelastine nasal spray 1-2 sprays per nostril twice a day as needed for runny nose/drainage. Nasal saline spray (i.e., Simply Saline) or nasal saline lavage (i.e., NeilMed) is recommended as needed and prior to medicated nasal sprays. Consider allergy injections for long term control if above medications do not help the symptoms - will need to retest.  Allergic reaction I have prescribed epinephrine injectable device and demonstrated proper use. For mild symptoms you can take over the counter antihistamines such as Benadryl 1-2 tablets = 25-50mg  and monitor symptoms closely. If symptoms worsen or if you have severe symptoms including breathing issues, throat closure, significant swelling, whole body hives, severe diarrhea and vomiting, lightheadedness then inject epinephrine and seek immediate medical care afterwards. Emergency action plan given.  Asthma Managed by pulmonology. Daily controller medication(s): Symbicort 2 puffs twice a day and rinse mouth after each use.  May use albuterol rescue inhaler 2 puffs every 4 to 6 hours as needed for shortness of breath, chest tightness, coughing, and wheezing. Monitor frequency of use.  Breathing control goals:  Full participation in all desired activities (may need albuterol before activity) Albuterol use two times or less a week on average (not counting use with activity) Cough interfering with sleep two times or less a month Oral steroids no more than once a year No hospitalizations   Follow up in 12 months or sooner  if needed.    Reducing Pollen Exposure Pollen seasons: trees (spring), grass (summer) and ragweed/weeds (fall). Keep windows closed in your home and car to lower pollen exposure.  Install air conditioning in the bedroom and throughout the house if possible.  Avoid going out in dry windy days - especially early morning. Pollen counts are highest between 5 - 10 AM and on dry, hot and windy days.  Save outside activities for late afternoon or after a heavy rain, when pollen levels are lower.  Avoid mowing of grass if you have grass pollen allergy. Be aware that pollen can also be transported indoors on people and pets.  Dry your clothes in an automatic dryer rather than hanging them outside where they might collect pollen.  Rinse hair and eyes before bedtime. Mold Control Mold and fungi can grow on a variety of surfaces provided certain temperature and moisture conditions exist.  Outdoor molds grow on plants, decaying vegetation and soil. The major outdoor mold, Alternaria and Cladosporium, are found in very high numbers during hot and dry conditions. Generally, a late summer - fall peak is seen for common outdoor fungal spores. Rain will temporarily lower outdoor mold spore count, but counts rise rapidly when the rainy period ends. The most important indoor molds are Aspergillus and Penicillium. Dark, humid and poorly ventilated basements are ideal sites for mold growth. The next most common sites of mold growth are the bathroom and the kitchen. Outdoor (Seasonal) Mold Control Use air conditioning and keep windows closed. Avoid exposure to decaying vegetation. Avoid leaf raking. Avoid grain handling. Consider wearing a face mask if working in moldy areas.  Indoor (Perennial) Mold Control  Maintain humidity below 50%. Get rid of mold growth on hard surfaces with water,  detergent and, if necessary, 5% bleach (do not mix with other cleaners). Then dry the area completely. If mold covers an area  more than 10 square feet, consider hiring an indoor environmental professional. For clothing, washing with soap and water is best. If moldy items cannot be cleaned and dried, throw them away. Remove sources e.g. contaminated carpets. Repair and seal leaking roofs or pipes. Using dehumidifiers in damp basements may be helpful, but empty the water and clean units regularly to prevent mildew from forming. All rooms, especially basements, bathrooms and kitchens, require ventilation and cleaning to deter mold and mildew growth. Avoid carpeting on concrete or damp floors, and storing items in damp areas. Control of House Dust Mite Allergen Dust mite allergens are a common trigger of allergy and asthma symptoms. While they can be found throughout the house, these microscopic creatures thrive in warm, humid environments such as bedding, upholstered furniture and carpeting. Because so much time is spent in the bedroom, it is essential to reduce mite levels there.  Encase pillows, mattresses, and box springs in special allergen-proof fabric covers or airtight, zippered plastic covers.  Bedding should be washed weekly in hot water (130 F) and dried in a hot dryer. Allergen-proof covers are available for comforters and pillows that can't be regularly washed.  Wash the allergy-proof covers every few months. Minimize clutter in the bedroom. Keep pets out of the bedroom.  Keep humidity less than 50% by using a dehumidifier or air conditioning. You can buy a humidity measuring device called a hygrometer to monitor this.  If possible, replace carpets with hardwood, linoleum, or washable area rugs. If that's not possible, vacuum frequently with a vacuum that has a HEPA filter. Remove all upholstered furniture and non-washable window drapes from the bedroom. Remove all non-washable stuffed toys from the bedroom.  Wash stuffed toys weekly. Pet Allergen Avoidance: Contrary to popular opinion, there are no  "hypoallergenic" breeds of dogs or cats. That is because people are not allergic to an animal's hair, but to an allergen found in the animal's saliva, dander (dead skin flakes) or urine. Pet allergy symptoms typically occur within minutes. For some people, symptoms can build up and become most severe 8 to 12 hours after contact with the animal. People with severe allergies can experience reactions in public places if dander has been transported on the pet owners' clothing. Keeping an animal outdoors is only a partial solution, since homes with pets in the yard still have higher concentrations of animal allergens. Before getting a pet, ask your allergist to determine if you are allergic to animals. If your pet is already considered part of your family, try to minimize contact and keep the pet out of the bedroom and other rooms where you spend a great deal of time. As with dust mites, vacuum carpets often or replace carpet with a hardwood floor, tile or linoleum. High-efficiency particulate air (HEPA) cleaners can reduce allergen levels over time. While dander and saliva are the source of cat and dog allergens, urine is the source of allergens from rabbits, hamsters, mice and [place] pigs; so ask a non-allergic family member to clean the animal's cage. If you have a pet allergy, talk to your allergist about the potential for allergy immunotherapy (allergy shots). This strategy can often provide long-term relief. Cockroach Allergen Avoidance Cockroaches are often found in the homes of densely populated urban areas, schools or commercial buildings, but these creatures can lurk almost anywhere. This does not mean that you have  a dirty house or living area. Block all areas where roaches can enter the home. This includes crevices, wall cracks and windows.  Cockroaches need water to survive, so fix and seal all leaky faucets and pipes. Have an exterminator go through the house when your family and pets are gone to  eliminate any remaining roaches. Keep food in lidded containers and put pet food dishes away after your pets are done eating. Vacuum and sweep the floor after meals, and take out garbage and recyclables. Use lidded garbage containers in the kitchen. Wash dishes immediately after use and clean under stoves, refrigerators or toasters where crumbs can accumulate. Wipe off the stove and other kitchen surfaces and cupboards regularly.

## 2022-10-31 NOTE — Assessment & Plan Note (Addendum)
Perennial rhinitis symptoms for 15+ years. 2020 skin testing was positive to trees, mold, cat and feathers. On AIT since 2012 and stopped over 1 year ago with no worsening symptoms. He is not sure if AIT helped. Severe reaction when around horses. Reviewed notes from [place] [facility] - see scanned records.  Start environmental control measures as below. Use over the counter antihistamines such as Zyrtec (cetirizine), Claritin (loratadine), Allegra (fexofenadine), or Xyzal (levocetirizine) daily as needed. May take twice a day during allergy flares. May switch antihistamines every few months. Use Flonase (fluticasone) sensamist nasal spray 2 sprays per nostril once a day as needed for nasal congestion.  Use azelastine nasal spray 1-2 sprays per nostril twice a day as needed for runny nose/drainage. Nasal saline spray (i.e., Simply Saline) or nasal saline lavage (i.e., NeilMed) is recommended as needed and prior to medicated nasal sprays. Consider allergy injections for long term control if above medications do not help the symptoms - will need to retest if interested.

## 2022-10-31 NOTE — Assessment & Plan Note (Signed)
Allergic reaction once when around horses. No prior Epipen use but prefers to have one on hand just in case.  I have prescribed epinephrine injectable device and demonstrated proper use. For mild symptoms you can take over the counter antihistamines such as Benadryl 1-2 tablets = 25-50mg  and monitor symptoms closely. If symptoms worsen or if you have severe symptoms including breathing issues, throat closure, significant swelling, whole body hives, severe diarrhea and vomiting, lightheadedness then inject epinephrine and seek immediate medical care afterwards. Emergency action plan given.

## 2022-10-31 NOTE — Assessment & Plan Note (Addendum)
Managed by pulmonology. No recent spirometry. Today's spirometry was unremarkable given effort. Daily controller medication(s): Symbicort 2 puffs twice a day and rinse mouth after each use.  May use albuterol rescue inhaler 2 puffs every 4 to 6 hours as needed for shortness of breath, chest tightness, coughing, and wheezing. Monitor frequency of use.

## 2022-11-07 DIAGNOSIS — G4733 Obstructive sleep apnea (adult) (pediatric): Secondary | ICD-10-CM | POA: Diagnosis not present

## 2022-11-11 DIAGNOSIS — R310 Gross hematuria: Secondary | ICD-10-CM | POA: Diagnosis not present

## 2022-11-11 DIAGNOSIS — N5201 Erectile dysfunction due to arterial insufficiency: Secondary | ICD-10-CM | POA: Diagnosis not present

## 2022-11-11 DIAGNOSIS — N3941 Urge incontinence: Secondary | ICD-10-CM | POA: Diagnosis not present

## 2022-11-11 DIAGNOSIS — R911 Solitary pulmonary nodule: Secondary | ICD-10-CM | POA: Diagnosis not present

## 2022-11-17 ENCOUNTER — Encounter: Payer: Self-pay | Admitting: Internal Medicine

## 2022-11-17 ENCOUNTER — Ambulatory Visit (INDEPENDENT_AMBULATORY_CARE_PROVIDER_SITE_OTHER): Payer: BC Managed Care – PPO | Admitting: Internal Medicine

## 2022-11-17 VITALS — BP 128/78 | HR 74 | Temp 98.3°F | Ht 68.5 in | Wt 233.0 lb

## 2022-11-17 DIAGNOSIS — G4733 Obstructive sleep apnea (adult) (pediatric): Secondary | ICD-10-CM

## 2022-11-17 DIAGNOSIS — J4540 Moderate persistent asthma, uncomplicated: Secondary | ICD-10-CM

## 2022-11-17 DIAGNOSIS — J342 Deviated nasal septum: Secondary | ICD-10-CM

## 2022-11-17 DIAGNOSIS — J320 Chronic maxillary sinusitis: Secondary | ICD-10-CM

## 2022-11-17 NOTE — Progress Notes (Signed)
Brian Walsh    [id number]    1961-07-09  Primary Care Physician:McGowen, Brian Morn, MD Date of Appointment: 11/17/2022 Established Patient Visit  Chief complaint:   Chief Complaint  Patient presents with   Follow-up    Pt states the astelin nasal spray has been working well for him but he states has been causing him headaches and he wants to know if there is anything else that could be prescribed instead.     HPI: Brian Walsh is a 62 y.o. man with moderate persistent asthma and osa on cpap as well as seasonal allergic rhinitis.   Interval Updates: Here for follow up after increasing symbicort dosing. Feeling better with breathing. Minimal albuterol use.  No flares.   Astelin nasal spray is helping his congestion, but is also causing migraine headaches. Flonase sensamist working well.  Been on claritin for many years  Did follow up with allergy, but does not want to take shots at this time.   Current Regimen: symbicort 160 mcg 2 puffs bid, albuterol as needed.  Asthma Triggers: allergies, cats, dogs Exacerbations in the last year: none History of hospitalization or intubation:never Allergy Testing: yes with history of allergy shots in mid 2010s.  GERD: no.  Allergic Rhinitis: yes on flonase ACT:  Asthma Control Test ACT Total Score  11/17/2022  3:44 PM 25  10/31/2022 10:00 AM 24  09/18/2022  8:42 AM 23   FeNO: 11 ppb in march 2024  Cpap download reviewed: 100% adherence with minimal AHI and minimal leak.   I have reviewed the patient's family social and past medical history and updated as appropriate.   Past Medical History:  Diagnosis Date   Abnormal chest CT    04/2021 right lower lobe mucoid impaction with inflammatory changes--> resolved on 08/2022 follow-up CT.   Allergic rhinitis    Much improved with allergy immunotherapy. Restart immunotherapy w/Dr. Barnetta Walsh winter 2020 (cat/dog dander, pollens).   Asthma    well controlled since allergies under  control with immunotherapy, has albut   Blood transfusion without reported diagnosis    BPH (benign prostatic hypertrophy) 2014   TURP   Chronic sinusitis    Deviated nasal septum: >90% airway obstruction on right.  Dr. Annalee Walsh discussed possible septoplasty and turbinate reduction with pt in 2015.   Constipation, chronic 11/24/2011   H/O blood clots    in eye    H/O nonmelanoma skin cancer    History of adenomatous polyp of colon    Most recent colonoscopy 07/2017---recall 5 yrs.   Idiopathic urticaria    IFG (impaired fasting glucose) 2019; 2021   A1c 5.8% 2019 and 2021.   Lower extremity venous stasis    Male hypogonadism    2023/24   OSA on CPAP 2005   cpap nightly   Personal history of colonic polyps 03/24/2012   03/2012 - 5 mm cecal adenoma   PUD (peptic ulcer disease)    Bleeding ulcer 1978   Recurrent upper respiratory infection (URI)    Relapsing remitting multiple sclerosis (HCC) 1992   Remission since 1996 on no meds.  Initial presentation was gait/balance and leg weakness sx's.  Additional relapses with blurry vision and uncoordination.  Dr. Everlena Walsh 04/2017 recommended re-estab baseline MRI brain and C-spine + start disease modifying therapy but pt declined.   Thyroid nodule 05/2012   Right lobe 80mmX14mm nodule found on screening thyroid and carotid u/s done through his employer.  Pt was euthyroid at that  time.  A radioactive iodine uptake and scan was normal 06/2012.  FNA bx 04/2015 was BENIGN/CYST.    Past Surgical History:  Procedure Laterality Date   APPENDECTOMY  1986   COLONOSCOPY  2006, 2013; 07/2017   diminutive hyperplastic cecal polyp ([place]); diminutive cecal adenoma removed 2013.  Repeat 07/24/17, one polyp--recall 5 yrs.   FEMUR FRACTURE SURGERY  1978   with rod insertion.  Rod removal 1979   FNA R Thyroid nodule  04/17/2015   Benign/cystic (Dr. Elvera Walsh)   INGUINAL HERNIA REPAIR Right 10/22/2015   Procedure: LAPAROSCOPIC RIGHT INGUINAL HERNIA REPAIR;   Surgeon: Brian Pickle, MD;  Location: [facility];  Service: General;  Laterality: Right;   INSERTION OF MESH Right 10/22/2015   Procedure: INSERTION OF MESH;  Surgeon: Brian Pickle, MD;  Location: [facility];  Service: General;  Laterality: Right;   TONSILLECTOMY     TRANSTHORACIC ECHOCARDIOGRAM  02/21/2021   EF 60-65%, all normal except ascending aorta 42 mm.  CT chest 04/29/21 showed same measurement.   TRANSURETHRAL RESECTION OF PROSTATE N/A 08/25/2012   Procedure: TRANSURETHRAL RESECTION OF THE PROSTATE WITH GYRUS INSTRUMENTS;  Surgeon: Brian Ache, MD;  Location: [facility];  Service: Urology;  Laterality: N/A;--patient got excellent results from procedure.    Family History  Problem Relation Age of Onset   Breast cancer Mother    Allergic rhinitis Father    Parkinsonism Father    Stroke Father    Colon polyps Father 3   Allergic rhinitis Sister    Allergic rhinitis Brother    Allergic rhinitis Brother    Colon cancer Neg Hx    Esophageal cancer Neg Hx    Rectal cancer Neg Hx    Stomach cancer Neg Hx     Social History   Occupational History   Occupation: HR Event organiser: [employer]  Tobacco Use   Smoking status: Never    Passive exposure: Never   Smokeless tobacco: Never  Vaping Use   Vaping Use: Never used  Substance and Sexual Activity   Alcohol use: Yes    Alcohol/week: 1.0 standard drink of alcohol    Types: 1 Glasses of wine per week    Comment: occasional wine    Drug use: No   Sexual activity: Not on file     Physical Exam: Blood pressure 128/78, pulse 74, temperature 98.3 F (36.8 C), temperature source Oral, height 5' 8.5" (1.74 m), weight 233 lb (105.7 kg), SpO2 98 %.  Gen:      No acute distress ENT:  no nasal polyps, mucus membranes moist Lungs:    No increased respiratory effort, symmetric chest wall excursion, clear to auscultation bilaterally, no wheezes or crackles CV:          Regular rate and rhythm; no murmurs, rubs, or gallops.  No pedal edema   Data Reviewed: Imaging: I have personally reviewed the CT Chest Feb 2024 which shows resolved mucoid impaction and atelectasis from scan in 2022. This improvement was re-demonstrated on scan in April 2024 for the CT A/P which showed his lung bases.   PFTs:   Labs: Lab Results  Component Value Date   NA 139 09/01/2022   K 4.6 09/01/2022   CO2 27 09/01/2022   GLUCOSE 120 (H) 09/01/2022   BUN 15 09/01/2022   CREATININE 1.00 09/01/2022   CALCIUM 9.5 09/01/2022   EGFR 86 04/18/2021   GFRNONAA >90 09/11/2012   Lab  Results  Component Value Date   WBC 4.5 09/17/2022   HGB 13.9 09/17/2022   HCT 41.9 09/17/2022   MCV 93.1 09/17/2022   PLT 220.0 09/17/2022    Immunization status: Immunization History  Administered Date(s) Administered   Influenza Split 03/14/2012   Influenza, High Dose Seasonal PF 06/27/2021   Influenza,inj,Quad PF,6+ Mos 04/26/2021, 04/21/2022   Influenza-Unspecified 04/14/2015   Moderna SARS-COV2 Booster Vaccination 06/12/2020   Moderna Sars-Covid-2 Vaccination 09/19/2019, 10/19/2019   PNEUMOCOCCAL CONJUGATE-20 04/26/2021   Tdap 11/24/2011   Zoster Recombinat (Shingrix) 01/02/2017, 03/17/2017    External Records Personally Reviewed: allergy  Assessment:  Moderate persisent asthma Chronic rhinitis, not well controlled, mixed allergic and non-allergic Osa on cpap, well controlled  Plan/Recommendations: Continue symbicort 160 2 puffs twice a day.   Continue taking the albuterol rescue inhaler every 4 to 6 hours as needed for chest tightness, coughing, wheezing or shortness of breath.   Continue flonase sensamist. You can stop astelin nasal spray due to headaches.  Stop claritin. Switch to zyrtec or xyzal anti-histamine for allergies.  I have put in a referral for ENT for your sinuses and deviated septum.   Call me sooner if issues or concerns with your breathing, asthma,  allergies or sleep.    Return to Care: Return in about 6 months (around 05/20/2023).   Durel Salts, MD Pulmonary and Critical Care Medicine [facility] Office:[phone]

## 2022-11-17 NOTE — Patient Instructions (Signed)
Please schedule follow up scheduled with myself in 6 months.  If my schedule is not open yet, we will contact you with a reminder closer to that time. Please call [phone] if you haven't heard from [place] a month before.   Continue symbicort 160 2 puffs twice a day.   Continue taking the albuterol rescue inhaler every 4 to 6 hours as needed for chest tightness, coughing, wheezing or shortness of breath.   Continue flonase sensamist. You can stop astelin nasal spray due to headaches.  Stop claritin. Switch to zyrtec or xyzal anti-histamine for allergies.  I have put in a referral for ENT for your sinuses and deviated septum.   Call me sooner if issues or concerns with your breathing, asthma, allergies or sleep.

## 2022-12-01 ENCOUNTER — Other Ambulatory Visit: Payer: Self-pay | Admitting: Allergy

## 2022-12-07 DIAGNOSIS — G4733 Obstructive sleep apnea (adult) (pediatric): Secondary | ICD-10-CM | POA: Diagnosis not present

## 2023-01-02 ENCOUNTER — Other Ambulatory Visit: Payer: Self-pay | Admitting: Allergy

## 2023-01-07 DIAGNOSIS — G4733 Obstructive sleep apnea (adult) (pediatric): Secondary | ICD-10-CM | POA: Diagnosis not present

## 2023-01-23 IMAGING — CT CT CHEST W/O CM
2 of 3 series · 15 of 36 positions shown, 18 images · non-contrast
Comparison: None.

CLINICAL DATA: Evaluate for ascending aortic dilatation.

EXAM:
CT CHEST WITHOUT CONTRAST
TECHNIQUE: Multidetector CT imaging of the chest was performed following the
standard protocol without IV contrast.

[Series 2: thorax · axial · 0.84mm/px · z∈[-358,-54]mm · 12 of 180 slices shown, 15 images]
[im 14/180  mediastinal]
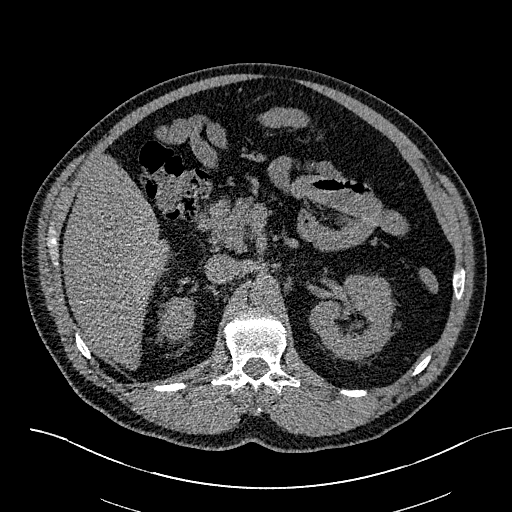
[im 14/180  lung]
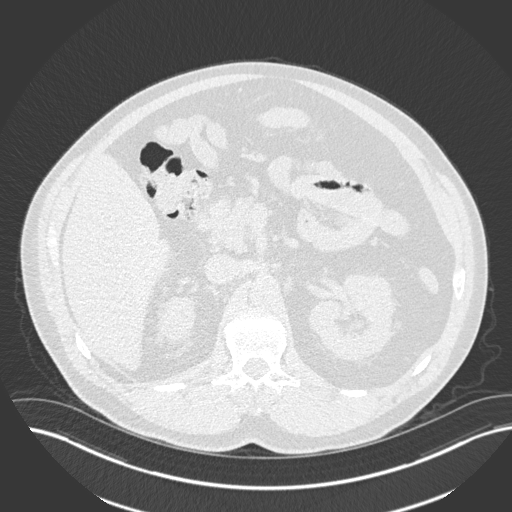
[im 27/180  lung]
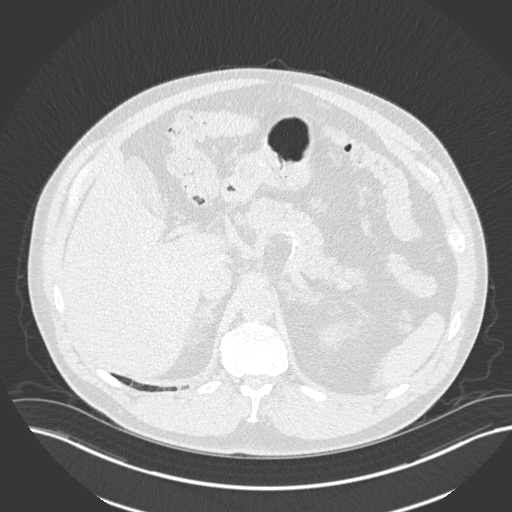
[im 40/180  lung]
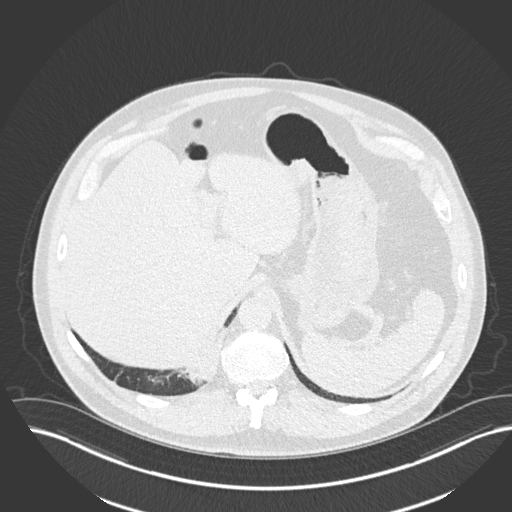
[im 54/180  lung]
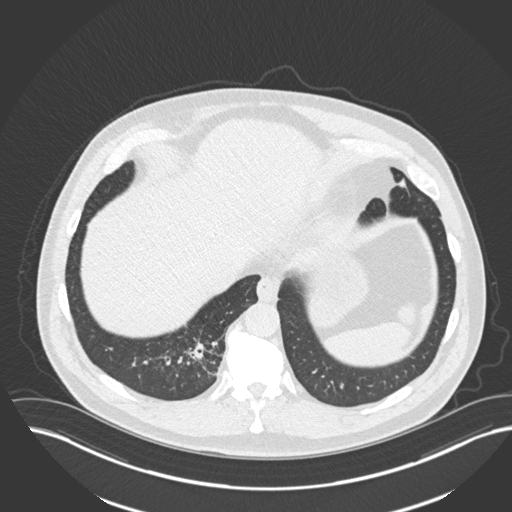
[im 67/180  mediastinal]
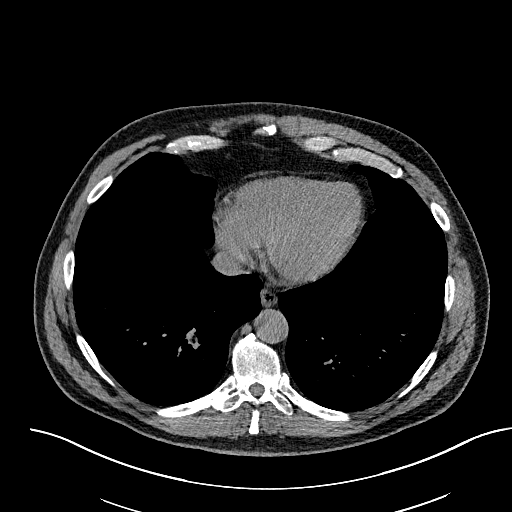
[im 67/180  lung]
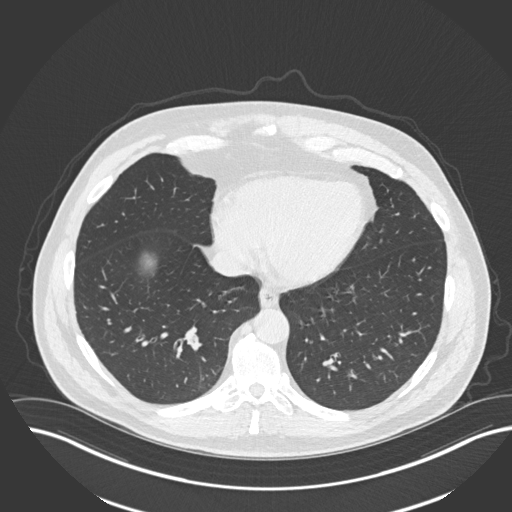
[im 80/180  lung]
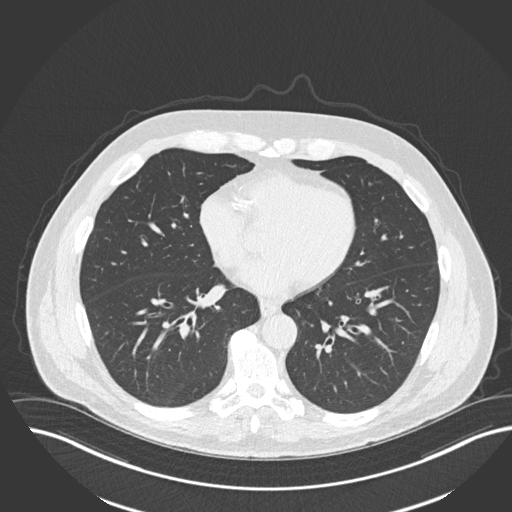
[im 100/180  lung]
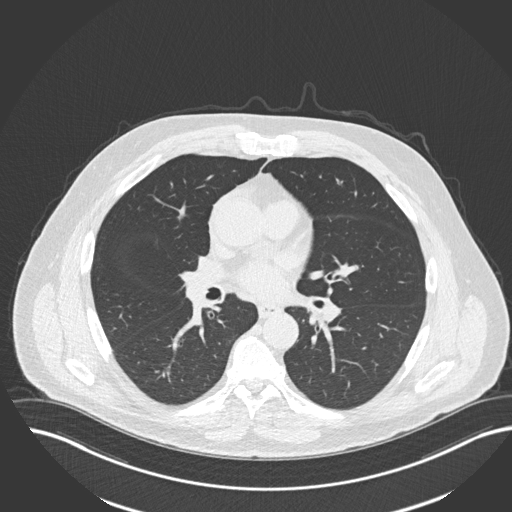
[im 113/180  lung]
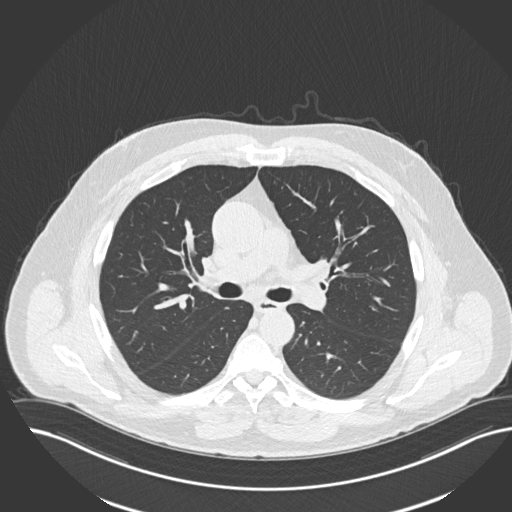
[im 126/180  mediastinal]
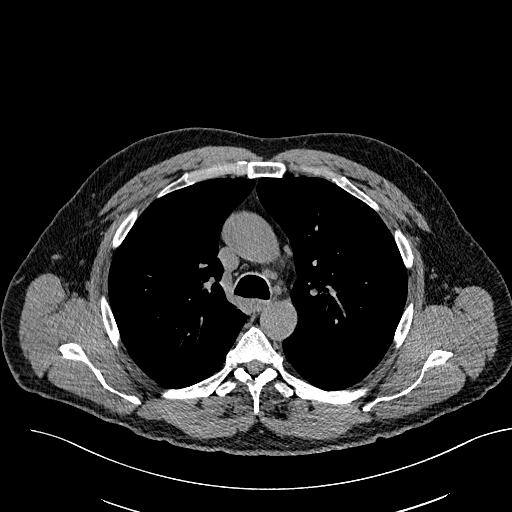
[im 126/180  lung]
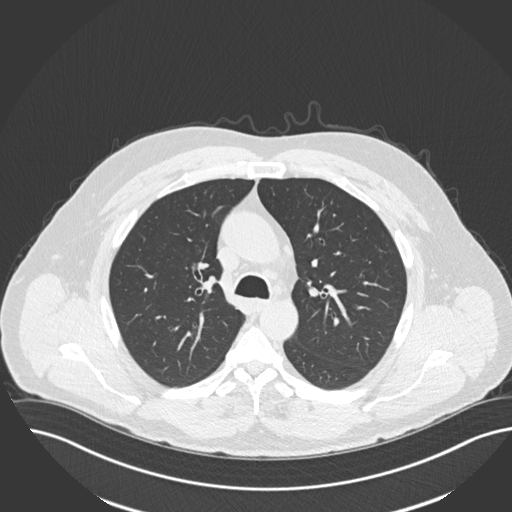
[im 140/180  lung]
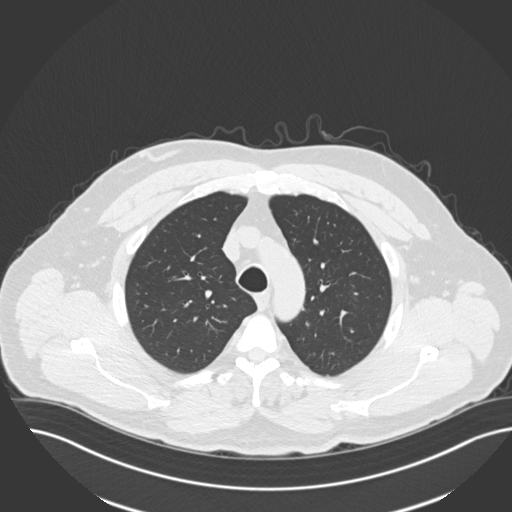
[im 153/180  lung]
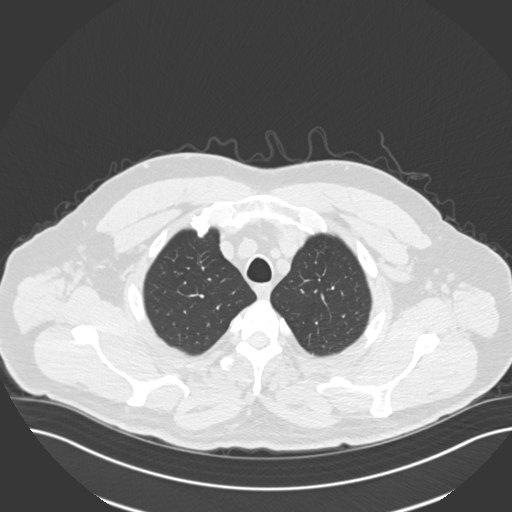
[im 166/180  lung]
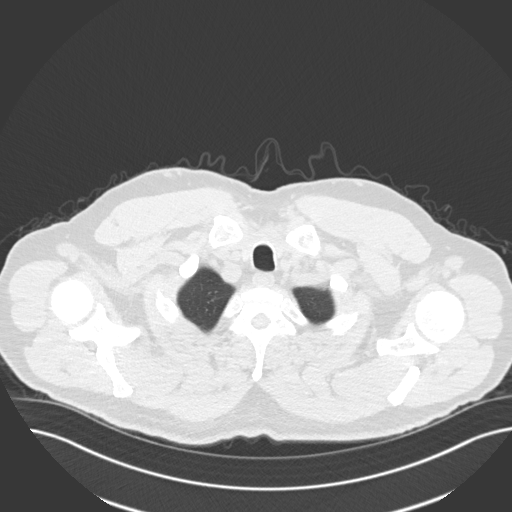

[Series 5: coronal · coronal · 0.71mm/px · 3 of 159 slices shown]
[im 32/159  lung]
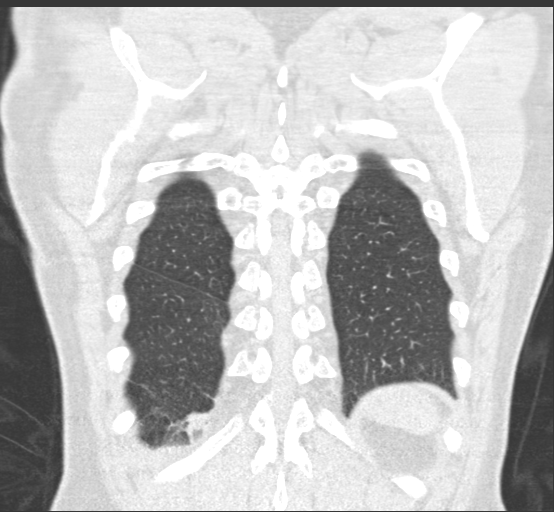
[im 64/159  lung]
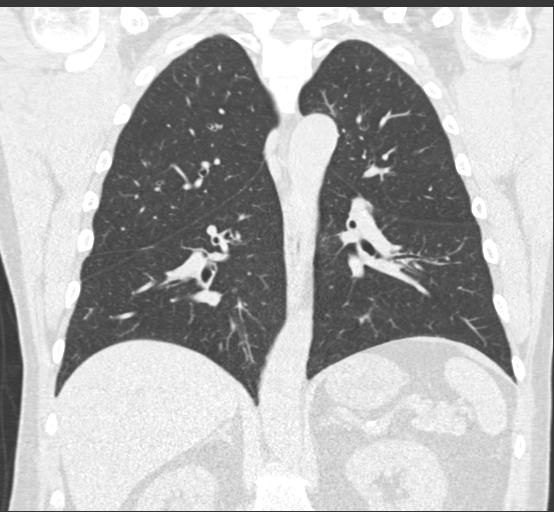
[im 95/159  lung]
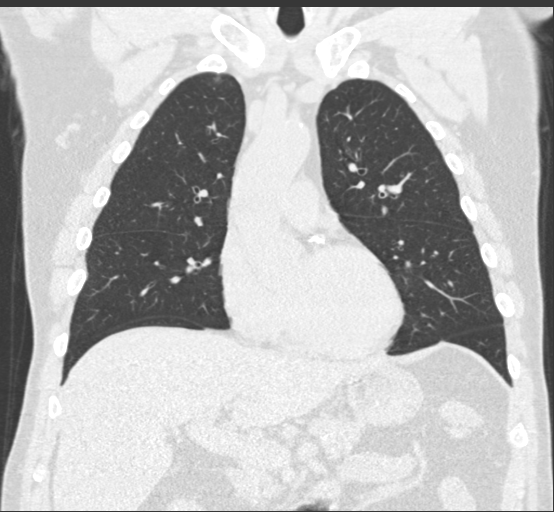

[15 of 36 positions shown; findings below may reference images not displayed]

FINDINGS: Cardiovascular: Heart size is normal. Mild aortic atherosclerosis.
Ascending thoracic aorta Measures 4.1 cm, image 79/2. Coronary
artery calcifications. No pericardial effusion.

Mediastinum/Nodes: No enlarged mediastinal or axillary lymph nodes.
Thyroid gland, trachea, and esophagus demonstrate no significant
findings.

Lungs/Pleura: There is bronchial wall thickening noted within the
posteromedial right lower lobe. Filling defects within the segmental
and subsegmental bronchi to the posteromedial right base noted
compatible with mucous plugging and or aspiration. Focal area of
subpleural consolidation is noted within the posterior right base
measuring 4.8 x 1.9 cm. 2 mm left midlung nodule is identified,
image 83/4.

Upper Abdomen: No acute abnormality.

Musculoskeletal: No chest wall mass or suspicious bone lesions
identified.
IMPRESSION: 1. Ascending thoracic aorta measures 4.1 cm. Recommend annual
imaging followup by CTA or MRA. This recommendation follows 2363
ACCF/AHA/AATS/ACR/ASA/SCA/IRMA GUADALUPE/DAZ/STILLY/DEGDGD Guidelines for the
Diagnosis and Management of Patients with Thoracic Aortic Disease.
Circulation. 2363; 121: [id number]. Aortic aneurysm NOS (NE7TI-PBJ.1)
2. Coronary artery calcifications noted.
3. Focal area of subpleural consolidation within the posterior right
base is identified. This is favored to represent an area of
pneumonia and/or aspiration. Signs of mucous plugging and/or
aspiration within the segmental and subsegmental right lower lobe
airway. Recommend follow-up CT of the chest in 3 months following a
trial of antibiotic therapy to ensure resolution and to exclude
underlying malignancy.
4. Aortic Atherosclerosis (NE7TI-0PM.M).

## 2023-01-31 ENCOUNTER — Other Ambulatory Visit: Payer: Self-pay | Admitting: Allergy

## 2023-02-06 DIAGNOSIS — G4733 Obstructive sleep apnea (adult) (pediatric): Secondary | ICD-10-CM | POA: Diagnosis not present

## 2023-03-09 DIAGNOSIS — G4733 Obstructive sleep apnea (adult) (pediatric): Secondary | ICD-10-CM | POA: Diagnosis not present

## 2023-03-26 NOTE — Progress Notes (Signed)
NEUROLOGY FOLLOW UP OFFICE NOTE  Brian Walsh [id number]  Assessment/Plan:   Multiple sclerosis Memory difficulty    We discussed workup for memory now, including MRI of brain and neuropsychological evaluation.  He would rather monitor for now.  Plan is to follow up in one year for repeat MoCA  Total time spent reviewing chart, preparing note and face to face with patient:  33 minutes  Subjective:  Brian Walsh is a 62 year old right-handed male with asthma, PUD, BPH and OSA who follows up for multiple sclerosis.     UPDATE: Last seen on 04/15/2022.  At that time, he noted increased fatigue and cognitive changes.  He was advised to continue B12 and followed up with sleep medicine to evaluate for any possible changes in his CPAP settings.  He has a new CPAP but doesn't feel he is getting full oxygenation.  Labs in February 2024 revealed B12 535, TSH of 1.21 and testosterone 244.66.   Some days he feels okay and able to work in the yard.  Some days he feels exhausted and when he sits on the couch, he falls asleep.  He and his wife still notes some memory problems.  May forget recent conversations.  However, he is independent and still works without difficulty.     HISTORY: In 1992, he developed onset of left lower extremity weakness that caused difficulty with gait and balance.  He was diagnosed with multiple sclerosis via brain MRI (multiple pericollosal lesions noted) and CSF analysis negative for oligoclonal bands but positive for elevated IgG index.  He was treated with IV SoluMedrol at that time.  He had two relapses in 1995 and 1996, presenting as blurred vision and incoordination, which also required treatment with SoluMedrol.  He has not had a relapse since 1996.  He has never been on a disease modifying drug.  He takes 2000 IU D3 daily, as well as calcium and zinc.  Last MRI was many years ago.    2023 was a stressful year.  He changed jobs but then returned to his previous work  because of stress level.  He started having short term memory difficulty.  He usually could remember everything in his head but has started to right things down.  He also began feeling more fatigued.  He has overall cut back on his work which has greatly improved his stress, fatigue, and memory problems.  He also increased sleep from 4-5 hours to 7 hours a night.  He is exercising regularly and eating healthy.  He has longstanding history of OSA for which he has used a CPAP for 17 years.  He feels rested overall.  His PCP check labs last month and he was found to have a low B12 level of 142.  He started OTC supplements ( daily).  No family history of dementia.     He tries to lead a healthy lifestyle with less red meat and more fish and green vegetables.  He exercises, usually early in the morning at a gym so to prevent from becoming overheated.  He has been under a lot of stress.  His wife has stage 4 colon cancer.  His son has epilepsy.  Work can be stressful.  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  PAST MEDICAL HISTORY: Past Medical History:  Diagnosis Date   Abnormal chest CT    04/2021 right lower lobe mucoid impaction with inflammatory changes--> resolved on 08/2022 follow-up CT.   Allergic rhinitis  Much improved with allergy immunotherapy. Restart immunotherapy w/Dr. Barnetta Chapel winter 2020 (cat/dog dander, pollens).   Asthma    well controlled since allergies under control with immunotherapy, has albut   Blood transfusion without reported diagnosis    BPH (benign prostatic hypertrophy) 2014   TURP   Chronic sinusitis    Deviated nasal septum: >90% airway obstruction on right.  Dr. Annalee Genta discussed possible septoplasty and turbinate reduction with pt in 2015.   Constipation, chronic 11/24/2011   H/O blood clots    in eye    H/O nonmelanoma skin cancer    History of adenomatous polyp of colon    Most recent colonoscopy 07/2017---recall 5 yrs.   Idiopathic urticaria     IFG (impaired fasting glucose) 2019; 2021   A1c 5.8% 2019 and 2021.   Lower extremity venous stasis    Male hypogonadism    2023/24   OSA on CPAP 2005   cpap nightly   Personal history of colonic polyps 03/24/2012   03/2012 - 5 mm cecal adenoma   PUD (peptic ulcer disease)    Bleeding ulcer 1978   Recurrent upper respiratory infection (URI)    Relapsing remitting multiple sclerosis (HCC) 1992   Remission since 1996 on no meds.  Initial presentation was gait/balance and leg weakness sx's.  Additional relapses with blurry vision and uncoordination.  Dr. Everlena Cooper 04/2017 recommended re-estab baseline MRI brain and C-spine + start disease modifying therapy but pt declined.   Thyroid nodule 05/2012   Right lobe 1mmX14mm nodule found on screening thyroid and carotid u/s done through his employer.  Pt was euthyroid at that time.  A radioactive iodine uptake and scan was normal 06/2012.  FNA bx 04/2015 was BENIGN/CYST.    MEDICATIONS: Current Outpatient Medications on File Prior to Visit  Medication Sig Dispense Refill   albuterol (VENTOLIN HFA) 108 (90 Base) MCG/ACT inhaler Inhale 2 puffs into the lungs every 4 (four) hours as needed for wheezing or shortness of breath. 18 each 1   aspirin EC 81 MG tablet Take 81 mg by mouth daily.     azelastine (ASTELIN) 0.1 % nasal spray Place 1 spray into both nostrils daily. Use in each nostril as directed 30 mL 12   B Complex-Folic Acid (B COMPLEX-VITAMIN B12 PO) Take 1 capsule by mouth daily. Unknown strenght     budesonide-formoterol (SYMBICORT) 160-4.5 MCG/ACT inhaler Inhale 2 puffs into the lungs in the morning and at bedtime. 1 each 6   Calcium-Magnesium-Zinc 167-83-8 MG TABS Take 1 tablet by mouth 2 (two) times daily after a meal.     cholecalciferol (VITAMIN D) 1000 units tablet Take 1,000 Units by mouth daily.     ePHEDrine HCl (PRIMATENE) 12.5 MG TABS Take 12.5 mg by mouth as needed (SOB, Wheezing).     EPINEPHrine 0.3 mg/0.3 mL IJ SOAJ injection  Inject 0.3 mg into the muscle as needed for anaphylaxis. 2 each 1   fluorouracil (EFUDEX) 5 % cream Apply 1 application  topically daily as needed (rash).  0   loratadine (CLARITIN) 10 MG tablet Take by mouth.     losartan (COZAAR) 25 MG tablet Take 1 tablet (25 mg total) by mouth daily. 90 tablet 3   psyllium (METAMUCIL) 58.6 % powder Take 1 packet by mouth 2 (two) times daily.      senna (SENOKOT) 8.6 MG tablet Take 2 tablets by mouth 2 (two) times daily as needed for constipation.     sildenafil (VIAGRA) 100 MG tablet SMARTSIG:1 Tablet(s)  By Mouth     tadalafil (CIALIS) 10 MG tablet 1-2 tabs po qd prn (Patient taking differently: Take 10 mg by mouth daily as needed for erectile dysfunction. 1-2 tabs po qd prn) 10 tablet 6   vitamin C (ASCORBIC ACID) 500 MG tablet Take 500 mg by mouth daily.     No current facility-administered medications on file prior to visit.    ALLERGIES: Allergies  Allergen Reactions   Molds & Smuts Other (See Comments)    FAMILY HISTORY: Family History  Problem Relation Age of Onset   Breast cancer Mother    Allergic rhinitis Father    Parkinsonism Father    Stroke Father    Colon polyps Father 40   Allergic rhinitis Sister    Allergic rhinitis Brother    Allergic rhinitis Brother    Colon cancer Neg Hx    Esophageal cancer Neg Hx    Rectal cancer Neg Hx    Stomach cancer Neg Hx       Objective:  Blood pressure (!) 152/81, pulse 67, height 5\' 10"  (1.778 m), weight 252 lb (114.3 kg), SpO2 97%. General: No acute distress.  Patient appears well-groomed.   Head:  Normocephalic/atraumatic Eyes:  Fundi examined but not visualized Neck: supple, no paraspinal tenderness, full range of motion Heart:  Regular rate and rhythm Neurological Exam: alert and oriented.  Speech fluent and not dysarthric, language intact.      03/30/2023   12:00 PM  Montreal Cognitive Assessment   Visuospatial/ Executive (0/5) 4  Naming (0/3) 3  Attention: Read list of digits  (0/2) 2  Attention: Read list of letters (0/1) 1  Attention: Serial 7 subtraction starting at 100 (0/3) 3  Language: Repeat phrase (0/2) 2  Language : Fluency (0/1) 0  Abstraction (0/2) 2  Delayed Recall (0/5) 1  Orientation (0/6) 6  Total 24  Adjusted Score (based on education) 24   CN II-XII intact. Bulk and tone normal, muscle strength 5/5 throughout.  Sensation to light touch intact.  Deep tendon reflexes 2+ throughout.  Finger to nose testing intact.  Gait normal, Romberg negative.   Shon Millet, DO  CC: Nicoletta Ba, MD

## 2023-03-30 ENCOUNTER — Encounter: Payer: Self-pay | Admitting: Neurology

## 2023-03-30 ENCOUNTER — Ambulatory Visit: Payer: BC Managed Care – PPO | Admitting: Neurology

## 2023-03-30 VITALS — BP 152/81 | HR 67 | Ht 70.0 in | Wt 252.0 lb

## 2023-03-30 DIAGNOSIS — G35 Multiple sclerosis: Secondary | ICD-10-CM | POA: Diagnosis not present

## 2023-03-30 DIAGNOSIS — G4733 Obstructive sleep apnea (adult) (pediatric): Secondary | ICD-10-CM | POA: Diagnosis not present

## 2023-03-30 DIAGNOSIS — R413 Other amnesia: Secondary | ICD-10-CM

## 2023-04-09 DIAGNOSIS — G4733 Obstructive sleep apnea (adult) (pediatric): Secondary | ICD-10-CM | POA: Diagnosis not present

## 2023-04-24 DIAGNOSIS — J342 Deviated nasal septum: Secondary | ICD-10-CM | POA: Diagnosis not present

## 2023-04-24 DIAGNOSIS — G4733 Obstructive sleep apnea (adult) (pediatric): Secondary | ICD-10-CM | POA: Diagnosis not present

## 2023-04-24 DIAGNOSIS — J3081 Allergic rhinitis due to animal (cat) (dog) hair and dander: Secondary | ICD-10-CM | POA: Diagnosis not present

## 2023-04-24 DIAGNOSIS — J343 Hypertrophy of nasal turbinates: Secondary | ICD-10-CM | POA: Diagnosis not present

## 2023-04-24 DIAGNOSIS — R0981 Nasal congestion: Secondary | ICD-10-CM | POA: Diagnosis not present

## 2023-05-09 DIAGNOSIS — G4733 Obstructive sleep apnea (adult) (pediatric): Secondary | ICD-10-CM | POA: Diagnosis not present

## 2023-05-22 ENCOUNTER — Ambulatory Visit (INDEPENDENT_AMBULATORY_CARE_PROVIDER_SITE_OTHER): Payer: BC Managed Care – PPO

## 2023-05-22 DIAGNOSIS — Z23 Encounter for immunization: Secondary | ICD-10-CM

## 2023-05-22 NOTE — Progress Notes (Signed)
Pt presents for flu vaccine, given IM left deltoid. Pt tolerated well.

## 2023-06-09 DIAGNOSIS — G4733 Obstructive sleep apnea (adult) (pediatric): Secondary | ICD-10-CM | POA: Diagnosis not present

## 2023-09-29 ENCOUNTER — Other Ambulatory Visit: Payer: Self-pay | Admitting: Cardiology

## 2023-09-29 DIAGNOSIS — I878 Other specified disorders of veins: Secondary | ICD-10-CM

## 2023-11-03 ENCOUNTER — Other Ambulatory Visit: Payer: Self-pay | Admitting: Cardiology

## 2023-11-03 DIAGNOSIS — I878 Other specified disorders of veins: Secondary | ICD-10-CM

## 2023-11-03 NOTE — Telephone Encounter (Signed)
 Prescription sent to pharmacy.

## 2023-11-13 ENCOUNTER — Other Ambulatory Visit: Payer: Self-pay

## 2023-11-13 ENCOUNTER — Telehealth: Payer: Self-pay | Admitting: Cardiology

## 2023-11-13 DIAGNOSIS — I878 Other specified disorders of veins: Secondary | ICD-10-CM

## 2023-11-13 MED ORDER — LOSARTAN POTASSIUM 25 MG PO TABS: 25.0000 mg | ORAL_TABLET | Freq: Every day | ORAL | 0 refills | Status: DC

## 2023-11-13 NOTE — Telephone Encounter (Signed)
 Called patient and informed him that his Losartan  medication had been re-filled. Patient verbalized understanding and had no further questions at this time.

## 2023-11-13 NOTE — Telephone Encounter (Signed)
*  STAT* If patient is at the pharmacy, call can be transferred to refill team.   1. Which medications need to be refilled? (please list name of each medication and dose if known)   losartan  (COZAAR ) 25 MG tablet    2. Which pharmacy/location (including street and city if local pharmacy) is medication to be sent to? [id number] Jonette Nestle, [place] - [address] AT [facility] [address] [facility] [address] Phone: [phone]  Fax: [phone]     3. Do they need a 30 day or 90 day supply? Pt made first avail appt with Dr Linnell Richardson on 7/28 please refill until then

## 2024-02-08 ENCOUNTER — Ambulatory Visit: Admitting: Cardiology

## 2024-02-14 ENCOUNTER — Other Ambulatory Visit: Payer: Self-pay | Admitting: Cardiology

## 2024-02-14 DIAGNOSIS — I878 Other specified disorders of veins: Secondary | ICD-10-CM

## 2024-02-18 ENCOUNTER — Ambulatory Visit: Attending: Cardiology | Admitting: Cardiology

## 2024-02-18 ENCOUNTER — Encounter: Payer: Self-pay | Admitting: Cardiology

## 2024-02-18 VITALS — BP 120/82 | HR 54 | Ht 70.0 in | Wt 239.0 lb

## 2024-02-18 DIAGNOSIS — I2510 Atherosclerotic heart disease of native coronary artery without angina pectoris: Secondary | ICD-10-CM | POA: Insufficient documentation

## 2024-02-18 DIAGNOSIS — I10 Essential (primary) hypertension: Secondary | ICD-10-CM

## 2024-02-18 DIAGNOSIS — I878 Other specified disorders of veins: Secondary | ICD-10-CM

## 2024-02-18 DIAGNOSIS — I77810 Thoracic aortic ectasia: Secondary | ICD-10-CM | POA: Diagnosis not present

## 2024-02-18 DIAGNOSIS — E785 Hyperlipidemia, unspecified: Secondary | ICD-10-CM

## 2024-02-18 DIAGNOSIS — G35 Multiple sclerosis: Secondary | ICD-10-CM

## 2024-02-18 NOTE — Patient Instructions (Signed)
 Medication Instructions:  Your physician recommends that you continue on your current medications as directed. Please refer to the Current Medication list given to you today.  *If you need a refill on your cardiac medications before your next appointment, please call your pharmacy*   Lab Work: Lipid, AST, ALT, Lpa- today If you have labs (blood work) drawn today and your tests are completely normal, you will receive your results only by: MyChart Message (if you have MyChart) OR A paper copy in the mail If you have any lab test that is abnormal or we need to change your treatment, we will call you to review the results.   Testing/Procedures: Your physician has requested that you have a lexiscan myoview. For further information please visit [email or web address] Please follow instruction sheet, as given.  The test will take approximately 3 to 4 hours to complete; you may bring reading material.  If someone comes with you to your appointment, they will need to remain in the main lobby due to limited space in the testing area.    How to prepare for your Myocardial Perfusion Test: Do not eat or drink 3 hours prior to your test, except you may have water. Do not consume products containing caffeine (regular or decaffeinated) 12 hours prior to your test. (ex: coffee, chocolate, sodas, tea). Do bring a list of your current medications with you.  If not listed below, you may take your medications as normal. Do wear comfortable clothes (no dresses or overalls) and walking shoes, tennis shoes preferred (No heels or open toe shoes are allowed). Do NOT wear cologne, perfume, aftershave, or lotions (deodorant is allowed). If these instructions are not followed, your test will have to be rescheduled.      Follow-Up: At [facility], you and your health needs are our priority.  As part of our continuing mission to provide you with exceptional heart care, we have created designated Provider Care Teams.   These Care Teams include your primary Cardiologist (physician) and Advanced Practice Providers (APPs -  Physician Assistants and Nurse Practitioners) who all work together to provide you with the care you need, when you need it.  We recommend signing up for the patient portal called MyChart.  Sign up information is provided on this After Visit Summary.  MyChart is used to connect with patients for Virtual Visits (Telemedicine).  Patients are able to view lab/test results, encounter notes, upcoming appointments, etc.  Non-urgent messages can be sent to your provider as well.   To learn more about what you can do with MyChart, go to [email or web address].    Your next appointment:   12 month(s)  The format for your next appointment:   In Person  Provider:   Lamar Fitch, MD    Other Instructions NA

## 2024-02-18 NOTE — Progress Notes (Unsigned)
 Cardiology Office Note:    Date:  02/18/2024   ID:  Brian Walsh, DOB [birth year], MRN [id number]  PCP:  Candise Aleene DEL, MD  Cardiologist:  Lamar Fitch, MD    Referring MD: Candise Aleene DEL, MD   Chief Complaint  Patient presents with   Annual Exam    History of Present Illness:    Brian Walsh is a 63 y.o. male past medical history significant for multiple sclerosis, will be following him because of ascending aortic aneurysm measuring 42 to 43 mm, stable, he does not have any enlargement even different sections of the aorta.  Also last CT of his chest done described heavy calcification of the coronary arteries.  Overall he is doing fine he goes to gym a little bit less frequent than before but on the regular basis.  Denies have any chest pain tightness squeezing pressure burning chest overall doing well.  Past Medical History:  Diagnosis Date   Abnormal chest CT    04/2021 right lower lobe mucoid impaction with inflammatory changes--> resolved on 08/2022 follow-up CT.   Allergic rhinitis    Much improved with allergy  immunotherapy. Restart immunotherapy w/Dr. Cheryn winter 2020 (cat/dog dander, pollens).   Asthma    well controlled since allergies under control with immunotherapy, has albut   Blood transfusion without reported diagnosis    BPH (benign prostatic hypertrophy) 2014   TURP   Chronic sinusitis    Deviated nasal septum: >90% airway obstruction on right.  Dr. Mable discussed possible septoplasty and turbinate reduction with pt in 2015.   Constipation, chronic 11/24/2011   H/O blood clots    in eye    H/O nonmelanoma skin cancer    History of adenomatous polyp of colon    Most recent colonoscopy 07/2017---recall 5 yrs.   Idiopathic urticaria    IFG (impaired fasting glucose) 2019; 2021   A1c 5.8% 2019 and 2021.   Lower extremity venous stasis    Male hypogonadism    2023/24   OSA on CPAP 2005   cpap nightly   Personal history of colonic polyps  03/24/2012   03/2012 - 5 mm cecal adenoma   PUD (peptic ulcer disease)    Bleeding ulcer 1978   Recurrent upper respiratory infection (URI)    Relapsing remitting multiple sclerosis (HCC) 1992   Remission since 1996 on no meds.  Initial presentation was gait/balance and leg weakness sx's.  Additional relapses with blurry vision and uncoordination.  Dr. Skeet 04/2017 recommended re-estab baseline MRI brain and C-spine + start disease modifying therapy but pt declined.   Thyroid  nodule 05/2012   Right lobe 13mmX14mm nodule found on screening thyroid  and carotid u/s done through his employer.  Pt was euthyroid at that time.  A radioactive iodine uptake and scan was normal 06/2012.  FNA bx 04/2015 was BENIGN/CYST.    Past Surgical History:  Procedure Laterality Date   APPENDECTOMY  1986   COLONOSCOPY  2006, 2013; 07/2017   diminutive hyperplastic cecal polyp ([place] ); diminutive cecal adenoma removed 2013.  Repeat 07/24/17, one polyp--recall 5 yrs.   FEMUR FRACTURE SURGERY  1978   with rod insertion.  Rod removal 1979   FNA R Thyroid  nodule  04/17/2015   Benign/cystic (Dr. Trixie)   INGUINAL HERNIA REPAIR Right 10/22/2015   Procedure: LAPAROSCOPIC RIGHT INGUINAL HERNIA REPAIR;  Surgeon: Herlene Beverley Bureau, MD;  Location: [facility];  Service: General;  Laterality: Right;   INSERTION OF MESH Right 10/22/2015   Procedure: INSERTION  OF MESH;  Surgeon: Herlene Righter Kinsinger, MD;  Location: [facility];  Service: General;  Laterality: Right;   TONSILLECTOMY     TRANSTHORACIC ECHOCARDIOGRAM  02/21/2021   EF 60-65%, all normal except ascending aorta 42 mm.  CT chest 04/29/21 showed same measurement.   TRANSURETHRAL RESECTION OF PROSTATE N/A 08/25/2012   Procedure: TRANSURETHRAL RESECTION OF THE PROSTATE WITH GYRUS INSTRUMENTS;  Surgeon: Ricardo Likens, MD;  Location: [facility];  Service: Urology;  Laterality: N/A;--patient got excellent results from procedure.     Current Medications: Current Meds  Medication Sig   albuterol  (VENTOLIN  HFA) 108 (90 Base) MCG/ACT inhaler Inhale 2 puffs into the lungs every 4 (four) hours as needed for wheezing or shortness of breath.   aspirin EC 81 MG tablet Take 81 mg by mouth daily.   azelastine  (ASTELIN ) 0.1 % nasal spray Place 1 spray into both nostrils daily. Use in each nostril as directed   B Complex-Folic Acid (B COMPLEX-VITAMIN B12 PO) Take 1 capsule by mouth daily. Unknown strenght   budesonide -formoterol  (SYMBICORT ) 160-4.5 MCG/ACT inhaler Inhale 2 puffs into the lungs in the morning and at bedtime.   Calcium-Magnesium-Zinc 167-83-8 MG TABS Take 1 tablet by mouth 2 (two) times daily after a meal.   cholecalciferol (VITAMIN D ) 1000 units tablet Take 1,000 Units by mouth daily.   ePHEDrine HCl (PRIMATENE ) 12.5 MG TABS Take 12.5 mg by mouth as needed (SOB, Wheezing).   EPINEPHrine  0.3 mg/0.3 mL IJ SOAJ injection Inject 0.3 mg into the muscle as needed for anaphylaxis.   fluorouracil (EFUDEX) 5 % cream Apply 1 application  topically daily as needed (rash).   loratadine  (CLARITIN ) 10 MG tablet Take by mouth.   losartan  (COZAAR ) 25 MG tablet TAKE 1 TABLET(25 MG) BY MOUTH DAILY   psyllium (METAMUCIL) 58.6 % powder Take 1 packet by mouth 2 (two) times daily.    senna (SENOKOT) 8.6 MG tablet Take 2 tablets by mouth 2 (two) times daily as needed for constipation.   sildenafil  (VIAGRA ) 100 MG tablet SMARTSIG:1 Tablet(s) By Mouth   tadalafil  (CIALIS ) 10 MG tablet 1-2 tabs po qd prn   vitamin C (ASCORBIC ACID) 500 MG tablet Take 500 mg by mouth daily.     Allergies:   Molds & smuts   Social History   Socioeconomic History   Marital status: Married    Spouse name: Not on file   Number of children: 1   Years of education: Not on file   Highest education level: Professional school degree (e.g., MD, DDS, DVM, JD)  Occupational History   Occupation: HR Event organiser: [employer]  Tobacco Use    Smoking status: Never    Passive exposure: Never   Smokeless tobacco: Never  Vaping Use   Vaping status: Never Used  Substance and Sexual Activity   Alcohol use: Yes    Alcohol/week: 1.0 standard drink of alcohol    Types: 1 Glasses of wine per week    Comment: occasional wine    Drug use: No   Sexual activity: Not on file  Other Topics Concern   Not on file  Social History Narrative   Married, has one 10 y/o son.   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   JD and MBA from [employer].   No tobacco, 3-4 glasses of red wine per week, no drug use.   No exercise.   [place]ard[place], plays tennis, works out  at gym 5 days a week,. He has a law degree-04/21/17-sjb   Right handed   2 story home   Caffeine intake yes   Social Drivers of Corporate investment banker Strain: Low Risk  (08/28/2022)   Overall Financial Resource Strain (CARDIA)    Difficulty of Paying Living Expenses: Not hard at all  Food Insecurity: No Food Insecurity (08/28/2022)   Hunger Vital Sign    Worried About Running Out of Food in the Last Year: Never true    Ran Out of Food in the Last Year: Never true  Transportation Needs: No Transportation Needs (08/28/2022)   PRAPARE - Administrator, Civil Service (Medical): No    Lack of Transportation (Non-Medical): No  Physical Activity: Sufficiently Active (08/28/2022)   Exercise Vital Sign    Days of Exercise per Week: 4 days    Minutes of Exercise per Session: 60 min  Stress: No Stress Concern Present (08/28/2022)   [employer] of Occupational Health - Occupational Stress Questionnaire    Feeling of Stress : Not at all  Social Connections: Socially Integrated (08/28/2022)   Social Connection and Isolation Panel    Frequency of Communication with Friends and Family: Three times a week    Frequency of Social Gatherings with Friends and Family: Once a week    Attends Religious Services: More than 4 times per year     Active Member of [employer] or Organizations: Yes    Attends Engineer, structural: More than 4 times per year    Marital Status: Married     Family History: The patient's family history includes Allergic rhinitis in his brother, brother, father, and sister; Breast cancer in his mother; Colon polyps (age of onset: 33) in his father; Parkinsonism in his father; Stroke in his father. There is no history of Colon cancer, Esophageal cancer, Rectal cancer, or Stomach cancer. ROS:   Please see the history of present illness.    All 14 point review of systems negative except as described per history of present illness  EKGs/Labs/Other Studies Reviewed:         Recent Labs: No results found for requested labs within last 365 days.  Recent Lipid Panel    Component Value Date/Time   CHOL 163 04/26/2021 0913   TRIG 104.0 04/26/2021 0913   HDL 58.30 04/26/2021 0913   CHOLHDL 3 04/26/2021 0913   VLDL 20.8 04/26/2021 0913   LDLCALC 84 04/26/2021 0913    Physical Exam:    VS:  BP 120/82   Pulse (!) 54   Ht 5' 10 (1.778 m)   Wt 239 lb (108.4 kg)   SpO2 97%   BMI 34.29 kg/m     Wt Readings from Last 3 Encounters:  02/18/24 239 lb (108.4 kg)  03/30/23 252 lb (114.3 kg)  11/17/22 233 lb (105.7 kg)     GEN:  Well nourished, well developed in no acute distress HEENT: Normal NECK: No JVD; No carotid bruits LYMPHATICS: No lymphadenopathy CARDIAC: RRR, no murmurs, no rubs, no gallops RESPIRATORY:  Clear to auscultation without rales, wheezing or rhonchi  ABDOMEN: Soft, non-tender, non-distended MUSCULOSKELETAL:  No edema; No deformity  SKIN: Warm and dry LOWER EXTREMITIES: no swelling NEUROLOGIC:  Alert and oriented x 3 PSYCHIATRIC:  Normal affect   ASSESSMENT:    1. Essential hypertension   2. Coronary artery calcification   3. Lower extremity venous stasis   4. Ascending aorta dilatation (HCC) 42 mm based on echocardiogram  from 2022   5. Multiple sclerosis (HCC)     PLAN:    In order of problems listed above:  Aneurysm of the ascending aorta.  Stable continue monitoring. Essential hypertension blood pressure controlled continue present management. Calcification of the coronary artery described as extensive.  Will schedule him to have exercise Cardiolite make sure he does not have any obstructive disease.  In the meantime we will continue with aspirin antiplatelet therapy. Dyslipidemia I did review K PN which show me data from 2022 with LDL 84 HDL 58.  Will repeat fasting lipid profile with anticipation of needing to start cholesterol medication in view of the fact that he had significant calcification of the coronary arteries.   Medication Adjustments/Labs and Tests Ordered: Current medicines are reviewed at length with the patient today.  Concerns regarding medicines are outlined above.  Orders Placed This Encounter  Procedures   EKG 12-Lead   Medication changes: No orders of the defined types were placed in this encounter.   Signed, Lamar DOROTHA Fitch, MD, [facility] 02/18/2024 8:20 AM    [facility]

## 2024-02-19 LAB — LIPID PANEL
Chol/HDL Ratio: 3 ratio (ref 0.0–5.0)
Cholesterol, Total: 155 mg/dL (ref 100–199)
HDL: 52 mg/dL (ref >39)
LDL Chol Calc (NIH): 84 mg/dL (ref 0–99)
Triglycerides: 101 mg/dL (ref 0–149)
VLDL Cholesterol Cal: 19 mg/dL (ref 5–40)

## 2024-02-19 LAB — AST: AST: 21 IU/L (ref 0–40)

## 2024-02-19 LAB — LIPOPROTEIN A (LPA): Lipoprotein (a): 109.6 nmol/L — ABNORMAL HIGH (ref <75.0)

## 2024-02-19 LAB — ALT: ALT: 29 IU/L (ref 0–44)

## 2024-02-22 ENCOUNTER — Other Ambulatory Visit: Payer: Self-pay | Admitting: Cardiology

## 2024-02-22 DIAGNOSIS — I878 Other specified disorders of veins: Secondary | ICD-10-CM

## 2024-02-23 ENCOUNTER — Telehealth (HOSPITAL_COMMUNITY): Payer: Self-pay | Admitting: *Deleted

## 2024-02-23 ENCOUNTER — Ambulatory Visit: Payer: Self-pay | Admitting: Cardiology

## 2024-02-23 ENCOUNTER — Encounter (HOSPITAL_COMMUNITY): Payer: Self-pay | Admitting: *Deleted

## 2024-02-23 NOTE — Telephone Encounter (Signed)
 Instructions sent for upcoming stress test via [employer].  Claudene Ronal Quale, RN

## 2024-02-25 ENCOUNTER — Telehealth: Payer: Self-pay

## 2024-02-25 DIAGNOSIS — E785 Hyperlipidemia, unspecified: Secondary | ICD-10-CM

## 2024-02-25 MED ORDER — ROSUVASTATIN CALCIUM 10 MG PO TABS: 10.0000 mg | ORAL_TABLET | Freq: Every day | ORAL | 3 refills | Status: AC

## 2024-02-25 NOTE — Telephone Encounter (Signed)
 Left message on My Chart with lab results per Dr. Karry note. Routed to PCP

## 2024-02-25 NOTE — Addendum Note (Signed)
 Addended by: ARLOA PLANAS D on: 02/25/2024 11:22 AM   Modules accepted: Orders

## 2024-02-29 ENCOUNTER — Other Ambulatory Visit: Payer: Self-pay | Admitting: Cardiology

## 2024-02-29 DIAGNOSIS — I2510 Atherosclerotic heart disease of native coronary artery without angina pectoris: Secondary | ICD-10-CM

## 2024-03-01 ENCOUNTER — Telehealth: Payer: Self-pay

## 2024-03-01 ENCOUNTER — Telehealth (HOSPITAL_COMMUNITY): Payer: Self-pay | Admitting: Cardiology

## 2024-03-01 NOTE — Telephone Encounter (Signed)
 Patient called and cancelled Myoview for reason below:  03/01/2024 7:28 AM Ab:DFPUY, Brian Walsh  Cancel Rsn: Patient   Order will be removed from [facility]. Thank you.

## 2024-03-01 NOTE — Telephone Encounter (Signed)
 Pt viewed lab results on My Chart per Dr. Karry note. Routed to PCP.

## 2024-03-02 ENCOUNTER — Ambulatory Visit (HOSPITAL_COMMUNITY): Admission: RE | Admit: 2024-03-02 | Source: Ambulatory Visit | Attending: Cardiology | Admitting: Cardiology

## 2024-03-02 ENCOUNTER — Encounter (HOSPITAL_COMMUNITY): Payer: Self-pay

## 2024-03-22 ENCOUNTER — Other Ambulatory Visit: Payer: Self-pay | Admitting: Allergy

## 2024-03-29 ENCOUNTER — Ambulatory Visit: Payer: BC Managed Care – PPO | Admitting: Neurology

## 2024-04-25 ENCOUNTER — Ambulatory Visit (INDEPENDENT_AMBULATORY_CARE_PROVIDER_SITE_OTHER)

## 2024-04-25 DIAGNOSIS — Z23 Encounter for immunization: Secondary | ICD-10-CM

## 2024-06-03 ENCOUNTER — Ambulatory Visit: Admitting: Nurse Practitioner

## 2024-06-03 ENCOUNTER — Ambulatory Visit: Payer: Self-pay

## 2024-06-03 VITALS — BP 130/84 | HR 62 | Temp 98.5°F | Ht 70.0 in | Wt 242.6 lb

## 2024-06-03 DIAGNOSIS — J4541 Moderate persistent asthma with (acute) exacerbation: Secondary | ICD-10-CM | POA: Diagnosis not present

## 2024-06-03 DIAGNOSIS — R10A1 Flank pain, right side: Secondary | ICD-10-CM | POA: Diagnosis not present

## 2024-06-03 LAB — POC URINALSYSI DIPSTICK (AUTOMATED)
Bilirubin, UA: NEGATIVE
Blood, UA: NEGATIVE
Glucose, UA: NEGATIVE
Ketones, UA: NEGATIVE
Leukocytes, UA: NEGATIVE
Nitrite, UA: NEGATIVE
Protein, UA: NEGATIVE
Spec Grav, UA: 1.015 (ref 1.010–1.025)
Urobilinogen, UA: 0.2 U/dL
pH, UA: 6 (ref 5.0–8.0)

## 2024-06-03 LAB — POCT INFLUENZA A/B
Influenza A, POC: NEGATIVE
Influenza B, POC: NEGATIVE

## 2024-06-03 LAB — POC COVID19 BINAXNOW: SARS Coronavirus 2 Ag: NEGATIVE

## 2024-06-03 MED ORDER — PREDNISONE 20 MG PO TABS
40.0000 mg | ORAL_TABLET | Freq: Every day | ORAL | 0 refills | Status: AC
Start: 2024-06-03 — End: ?

## 2024-06-03 MED ORDER — ALBUTEROL SULFATE HFA 108 (90 BASE) MCG/ACT IN AERS: 2.0000 | INHALATION_SPRAY | RESPIRATORY_TRACT | 0 refills | Status: DC | PRN

## 2024-06-03 NOTE — Telephone Encounter (Signed)
 LM for pt to return call to discuss.

## 2024-06-03 NOTE — Progress Notes (Signed)
 Acute Office Visit  Subjective:     Patient ID: Brian Walsh, male    DOB: 03-12-1961, 63 y.o.   MRN: [id number]  Chief Complaint  Patient presents with   Congestion    3 days congestion with sinus issues,Trouble breathing with wheezing this morning    HPI Discussed the use of AI scribe software for clinical note transcription with the patient, who gave verbal consent to proceed.  History of Present Illness   Brian Walsh is a 63 year old male who presents with wheezing and shortness of breath.  He experiences a 'rumbling' sensation and tightness in the chest, which is unusual for him. He uses an albuterol  inhaler twice a day and has previously used Symbicort . He has sinus issues from past injuries and performs regular sinus rinses. Occasional sinus headaches are treated with allergy  sinus pain medication. Back pain has been present for a couple of weeks and improves after bowel movements. He denies ear pain, pressure, headaches, itchy or runny eyes, nausea, vomiting, frequent urination, or burning during urination. He has not recently tested for COVID but has had it before and does not feel this is similar.     ROS See pertinent positives and negatives per HPI.     Objective:    BP 130/84 (BP Location: Left Arm, Cuff Size: Large)   Pulse 62   Temp 98.5 F (36.9 C) (Oral)   Ht 5' 10 (1.778 m)   Wt 242 lb 9.6 oz (110 kg)   SpO2 98%   BMI 34.81 kg/m    Physical Exam Vitals and nursing note reviewed.  Constitutional:      Appearance: Normal appearance.  HENT:     Head: Normocephalic.     Right Ear: Tympanic membrane, ear canal and external ear normal.     Left Ear: Tympanic membrane, ear canal and external ear normal.     Nose:     Right Sinus: No maxillary sinus tenderness or frontal sinus tenderness.     Left Sinus: No maxillary sinus tenderness or frontal sinus tenderness.     Mouth/Throat:     Mouth: Mucous membranes are moist.     Pharynx: Posterior  oropharyngeal erythema present. No oropharyngeal exudate.  Eyes:     Conjunctiva/sclera: Conjunctivae normal.  Cardiovascular:     Rate and Rhythm: Normal rate and regular rhythm.     Pulses: Normal pulses.     Heart sounds: Normal heart sounds.  Pulmonary:     Effort: Pulmonary effort is normal.     Breath sounds: Wheezing present.  Musculoskeletal:     Cervical back: Normal range of motion and neck supple. No tenderness.  Lymphadenopathy:     Cervical: No cervical adenopathy.  Skin:    General: Skin is warm.  Neurological:     General: No focal deficit present.     Mental Status: He is alert and oriented to person, place, and time.  Psychiatric:        Mood and Affect: Mood normal.        Behavior: Behavior normal.        Thought Content: Thought content normal.        Judgment: Judgment normal.     Results for orders placed or performed in visit on 06/03/24  POCT Influenza A/B  Result Value Ref Range   Influenza A, POC Negative Negative   Influenza B, POC Negative Negative  POC COVID-19 BinaxNow  Result Value Ref Range   SARS Coronavirus  2 Ag Negative Negative  POCT Urinalysis Dipstick (Automated)  Result Value Ref Range   Color, UA     Clarity, UA     Glucose, UA Negative Negative   Bilirubin, UA Negative    Ketones, UA Negative    Spec Grav, UA 1.015 1.010 - 1.025   Blood, UA Negative    pH, UA 6.0 5.0 - 8.0   Protein, UA Negative Negative   Urobilinogen, UA 0.2 0.2 or 1.0 E.U./dL   Nitrite, UA Negative    Leukocytes, UA Negative Negative        Assessment & Plan:   Problem List Items Addressed This Visit       Respiratory   Moderate persistent asthma with exacerbation - Primary   He presents with an acute asthma exacerbation characterized by wheezing and dyspnea. POC Covid-19 and flu negative. There is no fever or significant weakness, and clear phlegm is noted. Prescribe prednisone , 40mg  daily for 5 days. Renew albuterol  inhaler prescription and  advise continued use as needed. Recommend Mucinex for congestion and encourage increased fluid intake.      Relevant Medications   predniSONE  (DELTASONE ) 20 MG tablet   albuterol  (VENTOLIN  HFA) 108 (90 Base) MCG/ACT inhaler   Other Relevant Orders   POCT Influenza A/B (Completed)   POC COVID-19 BinaxNow (Completed)   Other Visit Diagnoses       Right flank pain       U/A negative. Encourage fluids. May be musculoskeletal. Can take tylenol  as needed for pain.   Relevant Orders   POCT Urinalysis Dipstick (Automated) (Completed)      Meds ordered this encounter  Medications   predniSONE  (DELTASONE ) 20 MG tablet    Sig: Take 2 tablets (40 mg total) by mouth daily with breakfast.    Dispense:  10 tablet    Refill:  0   albuterol  (VENTOLIN  HFA) 108 (90 Base) MCG/ACT inhaler    Sig: Inhale 2 puffs into the lungs every 4 (four) hours as needed for wheezing or shortness of breath.    Dispense:  18 each    Refill:  0    Return if symptoms worsen or fail to improve.  Tinnie DELENA Harada, NP

## 2024-06-03 NOTE — Telephone Encounter (Signed)
 Spoke with patient, he is feeling better, not currently short of breath. Will see patient in office today as scheduled.

## 2024-06-03 NOTE — Telephone Encounter (Signed)
 FYI Only or Action Required?: FYI only for provider: ED advised.  Patient was last seen in primary care on 09/01/2022 by McGowen, Aleene DEL, MD.  Called Nurse Triage reporting Wheezing.  Symptoms began today.  Interventions attempted: Prescription medications: Albuterol  inhaler.  Symptoms are: gradually worsening.  Triage Disposition: Go to ED Now (Notify PCP)  Patient/caregiver understands and will follow disposition?: Yes    Copied from CRM #[id number]. Topic: Clinical - Red Word Triage >> Jun 03, 2024  9:14 AM Roselie BROCKS wrote: [facility] that prompted transfer to Nurse Triage: Patient states he is having asthmatic attack, trouble breathing,Wheezing, Reason for Disposition  [1] MODERATE asthma attack (e.g., SOB at rest, speaks in phrases, audible wheezes) AND [2] doesn't have neb or inhaler available  Answer Assessment - Initial Assessment Questions Patient says he took his albuterol  inhaler last night before bed, something he normally does, then around 0200 this morning he woke up hard to breathe, wheezing, chest tightness. He took his inhaler and sat up in the recliner the rest of the night due to unable to lay flat. He is speaking in full sentences, audible wheezing could be heard by triage nurse. Advised to go to the ED for evaluation and call back to schedule f/u with PCP after the evaluation. He verbalized understanding and says he will go to [facility] ED.   1. RESPIRATORY STATUS: Describe your breathing? (e.g., wheezing, shortness of breath, unable to speak, severe coughing)      Wheezing, coughing up clear phlegm  2. ONSET: When did this asthma attack begin?      This morning at 0200 woke up with chest tightness, wheezing  3. TRIGGER: What do you think triggered this attack? (e.g., URI, exposure to pollen or other allergen, tobacco smoke)      No  5. SEVERITY: How bad is this attack?      Moderate  6. ASTHMA MEDICINES:  What treatments have you tried?       Albuterol  inhaler   7. INHALED QUICK-RELIEF TREATMENTS FOR THIS ATTACK: What treatments have you given yourself so far? and How many and how often? If using an inhaler, ask, How many puffs? Note: Routine treatments are 2 puffs every 4 hours as needed. Rescue treatments are 4 puffs repeated every 20 minutes, up to three times as needed.      Albuterol  inhaler 2 puffs every 4 hours as needed  8. OTHER SYMPTOMS: Do you have any other symptoms? (e.g., chest pain, coughing up yellow sputum, fever, runny nose)     Cough clear phlegm  Protocols used: Asthma Attack-A-AH

## 2024-06-03 NOTE — Patient Instructions (Signed)
 It was great to see you!  Keep drinking plenty of fluids  You can start mucinex as needed for congestion  Keep using the albuterol  inhaler every 6 hours as needed   Start prednisone  2 tablets daily with food  Let's follow-up if symptoms worsen or don't improve  Take care,  Tinnie Harada, NP

## 2024-06-03 NOTE — Assessment & Plan Note (Signed)
 He presents with an acute asthma exacerbation characterized by wheezing and dyspnea. POC Covid-19 and flu negative. There is no fever or significant weakness, and clear phlegm is noted. Prescribe prednisone , 40mg  daily for 5 days. Renew albuterol  inhaler prescription and advise continued use as needed. Recommend Mucinex for congestion and encourage increased fluid intake.

## 2024-06-03 NOTE — Telephone Encounter (Signed)
 Pt has appt at [id number]

## 2024-06-16 ENCOUNTER — Other Ambulatory Visit: Payer: Self-pay | Admitting: Nurse Practitioner

## 2024-06-16 NOTE — Telephone Encounter (Signed)
 Requesting: ALBUTEROL  HFA INH (200 PUFFS) 18GM  Last Visit: 06/03/2024 Next Visit: Visit date not found Last Refill: 06/03/2024  Please Advise

## 2024-06-23 ENCOUNTER — Telehealth: Payer: Self-pay | Admitting: Cardiology

## 2024-06-23 NOTE — Telephone Encounter (Signed)
 Patient called to get his MYOCARDIAL PERFUSION/CT RAD READ test orders re-instated as he now wants to schedule the test.

## 2024-06-29 ENCOUNTER — Other Ambulatory Visit: Payer: Self-pay | Admitting: Nurse Practitioner

## 2024-06-29 NOTE — Telephone Encounter (Signed)
 Requesting: ALBUTEROL  HFA INH (200 PUFFS) 18GM  Last Visit: 06/03/2024 Next Visit: Visit date not found Last Refill: 06/16/2024  Please Advise
